# Patient Record
Sex: Female | Born: 1980 | Race: White | Hispanic: No | Marital: Married | Smoking: Current every day smoker
Health system: Southern US, Community
[De-identification: ages and names within clinical notes are randomized; demographics above are authoritative.]

## PROBLEM LIST (undated history)

## (undated) DIAGNOSIS — R928 Other abnormal and inconclusive findings on diagnostic imaging of breast: Secondary | ICD-10-CM

## (undated) DIAGNOSIS — N200 Calculus of kidney: Secondary | ICD-10-CM

## (undated) DIAGNOSIS — F419 Anxiety disorder, unspecified: Secondary | ICD-10-CM

## (undated) DIAGNOSIS — M546 Pain in thoracic spine: Secondary | ICD-10-CM

## (undated) DIAGNOSIS — Z853 Personal history of malignant neoplasm of breast: Secondary | ICD-10-CM

## (undated) DIAGNOSIS — I2699 Other pulmonary embolism without acute cor pulmonale: Secondary | ICD-10-CM

## (undated) DIAGNOSIS — K7290 Hepatic failure, unspecified without coma: Secondary | ICD-10-CM

## (undated) DIAGNOSIS — J449 Chronic obstructive pulmonary disease, unspecified: Secondary | ICD-10-CM

## (undated) DIAGNOSIS — C801 Malignant (primary) neoplasm, unspecified: Secondary | ICD-10-CM

## (undated) DIAGNOSIS — M6282 Rhabdomyolysis: Secondary | ICD-10-CM

## (undated) DIAGNOSIS — K219 Gastro-esophageal reflux disease without esophagitis: Secondary | ICD-10-CM

## (undated) DIAGNOSIS — M1990 Unspecified osteoarthritis, unspecified site: Secondary | ICD-10-CM

## (undated) DIAGNOSIS — J189 Pneumonia, unspecified organism: Secondary | ICD-10-CM

## (undated) DIAGNOSIS — I509 Heart failure, unspecified: Secondary | ICD-10-CM

## (undated) DIAGNOSIS — M797 Fibromyalgia: Secondary | ICD-10-CM

## (undated) DIAGNOSIS — N289 Disorder of kidney and ureter, unspecified: Secondary | ICD-10-CM

## (undated) DIAGNOSIS — E876 Hypokalemia: Secondary | ICD-10-CM

## (undated) DIAGNOSIS — I10 Essential (primary) hypertension: Secondary | ICD-10-CM

## (undated) HISTORY — PX: KIDNEY STONE SURGERY: SHX686

## (undated) HISTORY — PX: TOTAL HIP ARTHROPLASTY: SHX124

## (undated) HISTORY — PX: APPENDECTOMY: SHX54

## (undated) HISTORY — PX: ABDOMINAL HYSTERECTOMY: SHX81

## (undated) HISTORY — PX: CHOLECYSTECTOMY: SHX55

## (undated) HISTORY — PX: OTHER SURGICAL HISTORY: SHX169

## (undated) HISTORY — DX: Unspecified osteoarthritis, unspecified site: M19.90

---

## 2010-08-30 ENCOUNTER — Inpatient Hospital Stay
Admit: 2010-08-30 | Discharge status: Against Medical Advice | Attending: Internal Medicine | Admitting: Internal Medicine

## 2012-03-10 ENCOUNTER — Emergency Department (HOSPITAL_COMMUNITY): Payer: Medicaid Other

## 2012-03-10 ENCOUNTER — Encounter (HOSPITAL_COMMUNITY): Payer: Self-pay | Admitting: *Deleted

## 2012-03-10 ENCOUNTER — Emergency Department (HOSPITAL_COMMUNITY)
Admission: EM | Admit: 2012-03-10 | Discharge: 2012-03-10 | Disposition: A | Discharge status: Home / Self Care | Payer: Medicaid Other | Attending: Emergency Medicine | Admitting: Emergency Medicine

## 2012-03-10 DIAGNOSIS — J4489 Other specified chronic obstructive pulmonary disease: Secondary | ICD-10-CM | POA: Insufficient documentation

## 2012-03-10 DIAGNOSIS — R079 Chest pain, unspecified: Secondary | ICD-10-CM | POA: Insufficient documentation

## 2012-03-10 DIAGNOSIS — K449 Diaphragmatic hernia without obstruction or gangrene: Secondary | ICD-10-CM | POA: Insufficient documentation

## 2012-03-10 DIAGNOSIS — J40 Bronchitis, not specified as acute or chronic: Secondary | ICD-10-CM | POA: Insufficient documentation

## 2012-03-10 DIAGNOSIS — F411 Generalized anxiety disorder: Secondary | ICD-10-CM | POA: Insufficient documentation

## 2012-03-10 DIAGNOSIS — Z86718 Personal history of other venous thrombosis and embolism: Secondary | ICD-10-CM | POA: Insufficient documentation

## 2012-03-10 DIAGNOSIS — J449 Chronic obstructive pulmonary disease, unspecified: Secondary | ICD-10-CM | POA: Insufficient documentation

## 2012-03-10 DIAGNOSIS — R0602 Shortness of breath: Secondary | ICD-10-CM | POA: Insufficient documentation

## 2012-03-10 DIAGNOSIS — R10816 Epigastric abdominal tenderness: Secondary | ICD-10-CM | POA: Insufficient documentation

## 2012-03-10 DIAGNOSIS — R112 Nausea with vomiting, unspecified: Secondary | ICD-10-CM | POA: Insufficient documentation

## 2012-03-10 DIAGNOSIS — E119 Type 2 diabetes mellitus without complications: Secondary | ICD-10-CM | POA: Insufficient documentation

## 2012-03-10 DIAGNOSIS — Z9089 Acquired absence of other organs: Secondary | ICD-10-CM | POA: Insufficient documentation

## 2012-03-10 DIAGNOSIS — I10 Essential (primary) hypertension: Secondary | ICD-10-CM | POA: Insufficient documentation

## 2012-03-10 HISTORY — DX: Gastro-esophageal reflux disease without esophagitis: K21.9

## 2012-03-10 HISTORY — DX: Other pulmonary embolism without acute cor pulmonale: I26.99

## 2012-03-10 HISTORY — DX: Hypokalemia: E87.6

## 2012-03-10 HISTORY — DX: Essential (primary) hypertension: I10

## 2012-03-10 HISTORY — DX: Disorder of kidney and ureter, unspecified: N28.9

## 2012-03-10 HISTORY — DX: Chronic obstructive pulmonary disease, unspecified: J44.9

## 2012-03-10 HISTORY — DX: Pneumonia, unspecified organism: J18.9

## 2012-03-10 HISTORY — DX: Heart failure, unspecified: I50.9

## 2012-03-10 LAB — URINALYSIS, ROUTINE W REFLEX MICROSCOPIC
Glucose, UA: NEGATIVE mg/dL
Leukocytes, UA: NEGATIVE
Nitrite: NEGATIVE
Specific Gravity, Urine: 1.031 — ABNORMAL HIGH (ref 1.005–1.030)
pH: 6.5 (ref 5.0–8.0)

## 2012-03-10 LAB — CBC
HCT: 36.9 % (ref 36.0–46.0)
MCHC: 32 g/dL (ref 30.0–36.0)
Platelets: 180 10*3/uL (ref 150–400)
RDW: 14.8 % (ref 11.5–15.5)
WBC: 6.7 10*3/uL (ref 4.0–10.5)

## 2012-03-10 LAB — RAPID URINE DRUG SCREEN, HOSP PERFORMED
Amphetamines: POSITIVE — AB
Barbiturates: NOT DETECTED
Cocaine: NOT DETECTED
Opiates: POSITIVE — AB
Tetrahydrocannabinol: NOT DETECTED

## 2012-03-10 LAB — COMPREHENSIVE METABOLIC PANEL
AST: 23 U/L (ref 0–37)
Albumin: 3.8 g/dL (ref 3.5–5.2)
BUN: 12 mg/dL (ref 6–23)
Chloride: 104 mEq/L (ref 96–112)
Creatinine, Ser: 0.72 mg/dL (ref 0.50–1.10)
Potassium: 3.5 mEq/L (ref 3.5–5.1)
Total Protein: 7.1 g/dL (ref 6.0–8.3)

## 2012-03-10 LAB — LIPASE, BLOOD: Lipase: 26 U/L (ref 11–59)

## 2012-03-10 LAB — PREGNANCY, URINE: Preg Test, Ur: NEGATIVE

## 2012-03-10 MED ORDER — SODIUM CHLORIDE 0.9 % IV BOLUS (SEPSIS)
1000.0000 mL | Freq: Once | INTRAVENOUS | Status: AC
  Administered 2012-03-10: 1000 mL via INTRAVENOUS

## 2012-03-10 MED ORDER — LORAZEPAM 2 MG/ML IJ SOLN
1.0000 mg | Freq: Once | INTRAMUSCULAR | Status: AC
  Administered 2012-03-10: 1 mg via INTRAVENOUS
  Filled 2012-03-10: qty 1

## 2012-03-10 MED ORDER — PANTOPRAZOLE SODIUM 40 MG IV SOLR
40.0000 mg | Freq: Once | INTRAVENOUS | Status: AC
  Administered 2012-03-10: 40 mg via INTRAVENOUS
  Filled 2012-03-10: qty 40

## 2012-03-10 MED ORDER — SODIUM CHLORIDE 0.9 % IV BOLUS (SEPSIS)
500.0000 mL | Freq: Once | INTRAVENOUS | Status: AC
  Administered 2012-03-10: 500 mL via INTRAVENOUS

## 2012-03-10 MED ORDER — ALBUTEROL SULFATE (5 MG/ML) 0.5% IN NEBU
5.0000 mg | INHALATION_SOLUTION | RESPIRATORY_TRACT | Status: AC
  Administered 2012-03-10: 5 mg via RESPIRATORY_TRACT
  Filled 2012-03-10: qty 1

## 2012-03-10 MED ORDER — ALBUTEROL SULFATE HFA 108 (90 BASE) MCG/ACT IN AERS: 2.0000 | INHALATION_SPRAY | RESPIRATORY_TRACT | Status: DC | PRN

## 2012-03-10 MED ORDER — HYDROCOD POLST-CHLORPHEN POLST 10-8 MG/5ML PO LQCR: 5.0000 mL | Freq: Two times a day (BID) | ORAL | Status: DC | PRN

## 2012-03-10 MED ORDER — ONDANSETRON HCL 4 MG/2ML IJ SOLN
4.0000 mg | Freq: Once | INTRAMUSCULAR | Status: AC
  Administered 2012-03-10: 4 mg via INTRAVENOUS
  Filled 2012-03-10: qty 2

## 2012-03-10 MED ORDER — MORPHINE SULFATE 4 MG/ML IJ SOLN
4.0000 mg | Freq: Once | INTRAMUSCULAR | Status: AC
  Administered 2012-03-10: 4 mg via INTRAVENOUS
  Filled 2012-03-10: qty 1

## 2012-03-10 MED ORDER — IOHEXOL 350 MG/ML SOLN
100.0000 mL | Freq: Once | INTRAVENOUS | Status: AC | PRN
  Administered 2012-03-10: 100 mL via INTRAVENOUS

## 2012-03-10 MED ORDER — ONDANSETRON 4 MG PO TBDP: 4.0000 mg | ORAL_TABLET | Freq: Three times a day (TID) | ORAL | Status: AC | PRN
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
## 2012-03-10 NOTE — Progress Notes (Signed)
Met with patient who reiterated life story of herself and her children, similar to that told to nurse. We discussed appointing a physician so that she can use her Medicaid. Mart Piggs PA aware of need for referral. Consulted with SW Lennox Laity to assist with any social issues concerning the patient.

## 2012-03-10 NOTE — ED Notes (Signed)
POC per EDP is to send pt home if all findings are within normal, Therapist, nutritional for information [facility] and [facility]

## 2012-03-10 NOTE — ED Notes (Signed)
Pt reports nausea and vomiting since last night. Reports today while getting out the shower, states she was found by someone after approx . 4mg  of zofran given en route. 324ASA and 2nitros reports chest pain 8/10 to 5/10 after nitros. Pt complaining of chest pain and sob that started today after syncopal episode.

## 2012-03-10 NOTE — ED Provider Notes (Signed)
3:45 PM Patient is in CDU holding for CT angio chest.  Sign out received from  [facility], PA-C, at change of shift.   Patient with a complicated medical and social history, reports hx multiple PEs, "ran out" of coumadin 2 months ago.  Presents today with c/o chest pain worse with deep inspiration, productive cough, N/V.  On exam, pt is A&Ox4, NAD, RRR, CTAB, abdomen is obese, soft, ttp epigastrium, no guarding, no rebound.  Plan is for CT angio chest.  If negative, pt to be d/c home with symptomatic medications.  I have spoken with Konrad Felix, case manager, who has also involved Jody of social work to speak with patient about PCP follow up/ resources in the area.    4:52 PM I have discussed all results with patient.  She is aware of the subpleural nodule and also of her anemia.  Pt reports she is still short of breath and having pain with breathing.  I have ordered morphine and a breathing treatment for her prior to departure.  Pt to be d/c home with symptomatic medications for bronchitis and N/V.  Resources to be given for follow up.  Patient verbalizes understanding and agrees with plan.     Results for orders placed during the hospital encounter of 03/10/12  CBC      Component Value Range   WBC 6.7  4.0 - 10.5 K/uL   RBC 4.80  3.87 - 5.11 MIL/uL   Hemoglobin 11.8 (*) 12.0 - 15.0 g/dL   HCT 16.1  09.6 - 04.5 %   MCV 76.9 (*) 78.0 - 100.0 fL   MCH 24.6 (*) 26.0 - 34.0 pg   MCHC 32.0  30.0 - 36.0 g/dL   RDW 40.9  81.1 - 91.4 %   Platelets 180  150 - 400 K/uL  COMPREHENSIVE METABOLIC PANEL      Component Value Range   Sodium 139  135 - 145 mEq/L   Potassium 3.5  3.5 - 5.1 mEq/L   Chloride 104  96 - 112 mEq/L   CO2 22  19 - 32 mEq/L   Glucose, Bld 99  70 - 99 mg/dL   BUN 12  6 - 23 mg/dL   Creatinine, Ser 7.82  0.50 - 1.10 mg/dL   Calcium 9.6  8.4 - 95.6 mg/dL   Total Protein 7.1  6.0 - 8.3 g/dL   Albumin 3.8  3.5 - 5.2 g/dL   AST 23  0 - 37 U/L   ALT 22  0 - 35 U/L   Alkaline  Phosphatase 125 (*) 39 - 117 U/L   Total Bilirubin 0.1 (*) 0.3 - 1.2 mg/dL   GFR calc non Af Amer >90  >90 mL/min   GFR calc Af Amer >90  >90 mL/min  LIPASE, BLOOD      Component Value Range   Lipase 26  11 - 59 U/L  URINALYSIS, ROUTINE W REFLEX MICROSCOPIC      Component Value Range   Color, Urine YELLOW  YELLOW   APPearance HAZY (*) CLEAR   Specific Gravity, Urine 1.031 (*) 1.005 - 1.030   pH 6.5  5.0 - 8.0   Glucose, UA NEGATIVE  NEGATIVE mg/dL   Hgb urine dipstick NEGATIVE  NEGATIVE   Bilirubin Urine NEGATIVE  NEGATIVE   Ketones, ur NEGATIVE  NEGATIVE mg/dL   Protein, ur NEGATIVE  NEGATIVE mg/dL   Urobilinogen, UA 0.2  0.0 - 1.0 mg/dL   Nitrite NEGATIVE  NEGATIVE   Leukocytes, UA  NEGATIVE  NEGATIVE  PREGNANCY, URINE      Component Value Range   Preg Test, Ur NEGATIVE  NEGATIVE  TROPONIN I      Component Value Range   Troponin I <0.30  <0.30 ng/mL  URINE RAPID DRUG SCREEN (HOSP PERFORMED)      Component Value Range   Opiates POSITIVE (*) NONE DETECTED   Cocaine NONE DETECTED  NONE DETECTED   Benzodiazepines NONE DETECTED  NONE DETECTED   Amphetamines POSITIVE (*) NONE DETECTED   Tetrahydrocannabinol NONE DETECTED  NONE DETECTED   Barbiturates NONE DETECTED  NONE DETECTED  PROTIME-INR      Component Value Range   Prothrombin Time 14.2  11.6 - 15.2 seconds   INR 1.08  0.00 - 1.49   Dg Chest 2 View  03/10/2012  *RADIOLOGY REPORT*  Clinical Data: Worsening left side chest pain, history diabetes, hypertension, COPD, CHF  CHEST - 2 VIEW  Comparison: None  Findings: Upper-normal size of cardiac silhouette. Mediastinal contours and pulmonary vascularity normal. Minimal peribronchial thickening. No definite infiltrate, pleural effusion or pneumothorax. Bones unremarkable.  IMPRESSION: Minimal bronchitic changes.  Original Report Authenticated By: Lollie Marrow, M.D.   Ct Angio Chest W/cm &/or Wo Cm  03/10/2012  *RADIOLOGY REPORT*  Clinical Data: Chest pain and shortness of  breath that began today after a syncopal episode.  CT ANGIOGRAPHY CHEST  Technique:  Multidetector CT imaging of the chest using the standard protocol during bolus administration of intravenous contrast. Multiplanar reconstructed images including MIPs were obtained and reviewed to evaluate the vascular anatomy.  Contrast: OMNIPAQUE IOHEXOL 350 MG/ML SOLN  Comparison: No priors.  Findings:  Mediastinum: No filling defects within the pulmonary arterial tree to suggest underlying pulmonary embolism. Heart size is normal. There is no significant pericardial fluid, thickening or pericardial calcification. No pathologically enlarged mediastinal or hilar lymph nodes. There is a small - moderate hiatal hernia.  Lungs/Pleura: No acute consolidative airspace disease.  No pleural effusions.  There is a 4 mm subpleural nodule associated with the left major fissure (image 35 of series six), nonspecific in appearance but strongly favored to represent a subpleural lymph node in this young patient.  Additionally, there is a 4 mm ground- glass attenuation nodule in the right upper lobe (image 32 of series 6) which is nonspecific but statistically benign in this young patient (likely inflammatory in etiology).  Upper Abdomen: Status post cholecystectomy.  There is a small amount of focal fat adjacent the falciform ligament within the liver incidentally noted.  Otherwise, unremarkable.  Musculoskeletal: There are no aggressive appearing lytic or blastic lesions noted in the visualized portions of the skeleton.  IMPRESSION: 1. Negative for pulmonary embolism. 2.  There is a 4 mm ground-glass attenuation nodule within the right upper lobe which is statistically likely to be benign in this young patient, likely inflammatory in etiology. No imaging follow- up is required for this lesion.  No other potentially acute findings to account for the patient's symptoms. 3.  Small - moderate hiatal hernia. 4.  Status post cholecystectomy.   Original Report Authenticated By: Florencia Reasons, M.D.      Dillard [place], [place] 03/10/12 [phone]

## 2012-03-10 NOTE — Discharge Instructions (Signed)
Read the information below.  Use the resources provided to find a primary care provider for follow up.  If you develop fevers, uncontrolled pain, difficulty swallowing or breathing, or are unable to tolerate fluids by mouth, please return to the ER immediately for a recheck.  Please discuss all medications with your pharmacist.  You may return to the ER at any time for worsening condition or any new symptoms that concern you.  Bronchitis Bronchitis is a problem of the air tubes leading to your lungs. This problem makes it hard for air to get in and out of the lungs. You may cough a lot because your air tubes are narrow. Going without care can cause lasting (chronic) bronchitis. HOME CARE   Drink enough fluids to keep your pee (urine) clear or pale yellow.   Use a cool mist humidifier.   Quit smoking if you smoke. If you keep smoking, the bronchitis might not get better.   Only take medicine as told by your doctor.  GET HELP RIGHT AWAY IF:   Coughing keeps you awake.   You start to wheeze.   You become more sick or weak.   You have a hard time breathing or get short of breath.   You cough up blood.   Coughing lasts more than 2 weeks.   You have a fever.   Your baby is older than 3 months with a rectal temperature of 102 F (38.9 C) or higher.   Your baby is 97 months old or younger with a rectal temperature of 100.4 F (38 C) or higher.  MAKE SURE YOU:  Understand these instructions.   Will watch your condition.   Will get help right away if you are not doing well or get worse.  Document Released: 03/01/2008 Document Revised: 09/02/2011 Document Reviewed: 08/15/2009 [facility] Patient Information 2012 [place], [place].  Hiatal Hernia A hiatal hernia occurs when a part of the stomach slides above the diaphragm. The diaphragm is the thin muscle separating the belly (abdomen) from the chest. A hiatal hernia can be something you are born with or develop over time. Hiatal hernias  may allow stomach acid to flow back into your esophagus, the tube which carries food from your mouth to your stomach. If this acid causes problems it is called GERD (gastro-esophageal reflux disease).  SYMPTOMS  Common symptoms of GERD are heartburn (burning in your chest). This is worse when lying down or bending over. It may also cause belching and indigestion. Some of the things which make GERD worse are:  Increased weight pushes on stomach making acid rise more easily.   Smoking markedly increases acid production.   Alcohol decreases lower esophageal sphincter pressure (valve between stomach and esophagus), allowing acid from stomach into esophagus.   Late evening meals and going to bed with a full stomach increases pressure.   Anything that causes an increase in acid production.   Lower esophageal sphincter incompetence.  DIAGNOSIS  Hiatal hernia is often diagnosed with x-rays of your stomach and small bowel. This is called an UGI (upper gastrointestinal x-ray). Sometimes a gastroscopic procedure is done. This is a procedure where your caregiver uses a flexible instrument to look into the stomach and small bowel. HOME CARE INSTRUCTIONS   Try to achieve and maintain an ideal body weight.   Avoid drinking alcoholic beverages.   Stop smoking.   Put the head of your bed on 4 to 6 inch blocks. This will keep your head and esophagus higher than  your stomach. If you cannot use blocks, sleep with several pillows under your head and shoulders.   Over-the-counter medications will decrease acid production. Your caregiver can also prescribe medications for this. Take as directed.   1/2 to 1 teaspoon of an antacid taken every hour while awake, with meals and at bedtime, will neutralize acid.   Do not take aspirin, ibuprofen (Advil or Motrin), or other nonsteroidal anti-inflammatory drugs.   Do not wear tight clothing around your chest or stomach.   Eat smaller meals and eat more  frequently. This keeps your stomach from getting too full. Eat slowly.   Do not lie down for 2 or 3 hours after eating. Do not eat or drink anything 1 to 2 hours before going to bed.   Avoid caffeine beverages (colas, coffee, cocoa, tea), fatty foods, citrus fruits and all other foods and drinks that contain acid and that seem to increase the problems.   Avoid bending over, especially after eating. Also avoid straining during bowel movements or when urinating or lifting things. Anything that increases the pressure in your belly increases the amount of acid that may be pushed up into your esophagus.  SEEK IMMEDIATE MEDICAL CARE IF:  There is change in location (pain in arms, neck, jaw, teeth or back) of your pain, or the pain is getting worse.   You also experience nausea, vomiting, sweating (diaphoresis), or shortness of breath.   You develop continual vomiting, vomit blood or coffee ground material, have bright red blood in your stools, or have black tarry stools.  Some of these symptoms could signal other problems such as heart disease. MAKE SURE YOU:   Understand these instructions.   Monitor your condition.   Contact your caregiver if you are not doing well or are getting worse.  Document Released: 12/04/2003 Document Revised: 09/02/2011 Document Reviewed: 09/13/2005 [facility] Patient Information 2012 [place], [place].  If you have no primary doctor, here are some resources that may be helpful:  Medicaid-accepting [facility] Providers:   - [facility]- [address]      [phone]      Mon-Fri 9am-7pm, Sat 9am-1pm   - [facility]- [address] [place], [place]      [phone]   - [facility]- [address] [place]      [phone]   [facility] Family [facility]- [address]      [phone]   - Renaye Rakers- [address] [place], [address]      [phone]      Only accepts [place]  patients       after they have her name applied to their card   Self Pay (no insurance) in [place]:   - Sickle Cell Patients: Dr Willey Blade, [facility] Internal Medicine      [address] [place]      [phone]   - Health [phone]   - Physician Referral Service- [phone]   - [facility]- [address] [place]      [phone]   [facility] [place]- [address]   - [facility]- see information above      (Speak to [employer] if you do not have insurance)   - [facility]- [address] [place]      [phone]   - [facility] [place]-  [address]      [phone]   - [facility]- [address]      [phone]   - Dr Julio Sicks-  [address], [place]      [phone]   - [facility]- [address]      [phone]   - [facility]- [address]      [phone]      Also [address]      [phone]   - [facility]- [address]      [phone]      1st and 3rd Saturday every month, 10am-1pm Other agencies that provide inexpensive medical care:    [facility]  [phone]    [facility]  [phone]    [facility]  [phone]    [facility]  [phone]    [facility]  [phone]  General Information: Finding a doctor when you do not have health insurance can be tricky. Although you are not limited by an insurance plan, you are of course limited by her finances and how much but he can pay out of pocket.  What are your options if you don't have health insurance?   1) Find a Librarian, academic and Pay Out of Pocket Although you won't have to find out who is covered by your insurance plan, it is a good idea to ask around and get recommendations. You will then need to call the office and see if the doctor you have chosen will accept you as a new patient and what types of options they offer for patients who are  self-pay. Some doctors offer discounts or will set up payment plans for their patients who do not have insurance, but you will need to ask so you aren't surprised when you get to your appointment.  2) Contact Your Local Health Department Not all health departments have doctors that can see patients for sick visits, but many do, so it is worth a call to see if yours does. If you don't know where your local health department is, you can check in your phone book. The CDC also has a tool to help you locate your state's health department, and many state websites also have listings of all of their local health departments.  3) Find a Walk-in Clinic If your illness is not likely to be very severe or complicated, you may want to try a walk in clinic. These are popping up all over the country in pharmacies, drugstores, and shopping centers. They're usually staffed by nurse practitioners or physician assistants that have been trained to treat common illnesses and complaints. They're usually fairly quick and inexpensive. However, if you have serious medical issues or chronic medical problems, these are probably not your best option   RESOURCE GUIDE  Chronic Pain Problems: Contact [facility]  [phone] Patients need to be referred by their primary care doctor.  Insufficient Money for Medicine: Contact United Way:  call "211" or [facility] [phone].  No Primary Care Doctor: - Call Health Connect  [phone] - can help you locate a primary care doctor that  accepts your insurance, provides certain services, etc. - Physician Referral Service- [phone]  Agencies that provide inexpensive medical care: - [facility]  [phone] - [facility]  [phone] - [facility]  [phone] - Women's Clinic  [phone] -  [facility]  [phone] [facility]  [phone]  Medicaid-accepting [facility] Providers: - [facility]- [address]  [phone], Mon-Fri 9am-7pm, Sat 9am-1pm - [facility]- [address] [place], [place]  [phone] - [facility]- [address], [address] [place]  [phone] [facility] [place] [facility]- [address]  [phone] - Renaye Rakers- [address] [place], [address], [phone]  Only accepts [place] patients after they have their name  applied to their card  Self Pay (no insurance) in [place]: - Sickle Cell Patients: Dr Willey Blade, [facility] Medicine  [address] [place], [phone] - [facility] Urgent Care- [address] [place]  [phone]       [facility] [place]- [address]       -     [facility]- see information above (Speak to [employer] if you do not have insurance)       -  [facility]- [address] [place], [phone]       -  [facility]- [address],  [phone]       -  [facility]- [address], [phone]       -  Dr Julio Sicks-  [address], [place], [phone]       -  [facility]- [address] [phone]       -  [facility] [address] [phone], also [address], [phone]       -    [facility]- [address] [place], [phone], 1st & 3rd Saturday   every month, 10am-1pm  1) Find a Doctor and Pay Out of Pocket Although you won't have to find out who is covered by your insurance plan, it is a good idea to ask around and get recommendations. You will then need to call the office and see if the doctor you have chosen will accept you as a new patient and what types of options they offer for patients who are self-pay. Some doctors offer discounts or will set up payment plans for their patients who do not have insurance, but you will need to ask so you aren't surprised when you get to your appointment.  2) Contact Your Local  Health Department Not all health departments have doctors that can see patients for sick visits, but many do, so it is worth a call to see if yours does. If you don't know where your local health department is, you can check in your phone book. The CDC also has a tool to help you locate your state's health department, and many state websites also have listings of all of their local health departments.  3) Find a Walk-in Clinic If your illness is not likely to be very severe or complicated, you may want to try a walk in clinic. These are popping up all over the country in pharmacies, drugstores, and shopping centers. They're usually staffed by nurse practitioners or physician assistants that have been trained to treat common illnesses and complaints. They're usually fairly quick and inexpensive. However, if you have serious medical issues or chronic medical problems, these are probably not your best option  STD Testing - [facility] Department of [facility], STD Clinic, [address],  [place], phone [phone] or [phone].  Monday - Friday, call for an appointment. [facility] Department of [employer], STD Clinic, [address], [place], phone [phone] or [phone].  Monday - Friday, call for an appointment.  Abuse/Neglect: [facility] Child Abuse Hotline [phone] [facility] Child Abuse Hotline 715-389-5620 (After Hours)  Emergency Shelter:  [facility] [phone]  Maternity Homes: - Room at the [place] [facility] [phone] - [facility] [phone]  MRSA Hotline #:   240-179-1637  [facility] Resources  [facility] [place]  United Way [facility] Dept. [address]                 [address]         [address] [place] [address]  Blondell Reveal Phone:   [phone]                                  Phone:  [phone]                   Phone:  [phone]  [facility] Mental Health, [phone] - [phone]       -     [facility] in [place], [address]                                  [phone], [facility] Child Abuse Hotline 605-222-7848 or [phone] (After Hours)   Behavioral Health Services  Substance Abuse Resources: - Alcohol and Drug Services  409-185-2308 - [facility] [phone])184-4349 - [facility] [phone] Floydene Flock [phone] - Residential & Outpatient Substance Abuse Program  (514) [phone]  Psychological Services: [facility]  [phone] Services  [phone] - [facility], [phone] [place]. [address] [place], ACCESS LINE: (860)[phone] or ([phone], [email or web address]  Dental Assistance  If unable to pay or uninsured, contact:  [facility] Serve or [facility]. to become qualified for the adult dental clinic.  Patients with Medicaid: [facility] [phone], [phone], [phone]  If unable to pay, or uninsured, contact [facility] [phone]) or [facility] [place] Department [phone] in [place], [phone] in [facility]) to become qualified for the adult dental clinic  Other Low-Cost Community Dental Services: - [facility]- [address] [place], [place], [place], [address], [phone], [address], 2nd and 4th Thursday of the month at 6:30am.  10 clients each day by appointment, can sometimes see walk-in patients if someone does not show for an appointment. [facility]- [address] Ether Griffins [place], [place], [address], [phone] - [facility]- [address], [place], [place], [address], [phone] [facility] [phone] -  [facility] Department- [phone] [facility] [phone]
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
## 2012-03-11 NOTE — ED Provider Notes (Signed)
Medical screening examination/treatment/procedure(s) were conducted as a shared visit with non-physician practitioner(s) and myself.  I personally evaluated the patient during the encounter See H and P  Suzi Roots, MD 03/11/12 [phone]

## 2012-03-24 ENCOUNTER — Emergency Department (HOSPITAL_COMMUNITY)
Admission: EM | Admit: 2012-03-24 | Discharge: 2012-03-24 | Disposition: A | Discharge status: Home / Self Care | Payer: Medicaid Other | Attending: Emergency Medicine | Admitting: Emergency Medicine

## 2012-03-24 ENCOUNTER — Encounter (HOSPITAL_COMMUNITY): Payer: Self-pay | Admitting: Emergency Medicine

## 2012-03-24 ENCOUNTER — Emergency Department (HOSPITAL_COMMUNITY): Payer: Medicaid Other

## 2012-03-24 DIAGNOSIS — J449 Chronic obstructive pulmonary disease, unspecified: Secondary | ICD-10-CM | POA: Insufficient documentation

## 2012-03-24 DIAGNOSIS — R109 Unspecified abdominal pain: Secondary | ICD-10-CM | POA: Insufficient documentation

## 2012-03-24 DIAGNOSIS — I509 Heart failure, unspecified: Secondary | ICD-10-CM | POA: Insufficient documentation

## 2012-03-24 DIAGNOSIS — R319 Hematuria, unspecified: Secondary | ICD-10-CM | POA: Insufficient documentation

## 2012-03-24 DIAGNOSIS — Z79899 Other long term (current) drug therapy: Secondary | ICD-10-CM | POA: Insufficient documentation

## 2012-03-24 DIAGNOSIS — K219 Gastro-esophageal reflux disease without esophagitis: Secondary | ICD-10-CM | POA: Insufficient documentation

## 2012-03-24 DIAGNOSIS — E119 Type 2 diabetes mellitus without complications: Secondary | ICD-10-CM | POA: Insufficient documentation

## 2012-03-24 DIAGNOSIS — J4489 Other specified chronic obstructive pulmonary disease: Secondary | ICD-10-CM | POA: Insufficient documentation

## 2012-03-24 LAB — URINALYSIS, ROUTINE W REFLEX MICROSCOPIC
Glucose, UA: NEGATIVE mg/dL
Ketones, ur: 15 mg/dL — AB
Protein, ur: 100 mg/dL — AB
pH: 6 (ref 5.0–8.0)

## 2012-03-24 LAB — BASIC METABOLIC PANEL
BUN: 15 mg/dL (ref 6–23)
CO2: 22 mEq/L (ref 19–32)
Chloride: 100 mEq/L (ref 96–112)
GFR calc Af Amer: >90 mL/min (ref >90)
Glucose, Bld: 110 mg/dL — ABNORMAL HIGH (ref 70–99)
Potassium: 3.2 mEq/L — ABNORMAL LOW (ref 3.5–5.1)

## 2012-03-24 LAB — CBC WITH DIFFERENTIAL/PLATELET
Basophils Relative: 0 % (ref 0–1)
Hemoglobin: 12.8 g/dL (ref 12.0–15.0)
Lymphocytes Relative: 35 % (ref 12–46)
Lymphs Abs: 2.9 10*3/uL (ref 0.7–4.0)
Monocytes Relative: 5 % (ref 3–12)
Neutro Abs: 4.9 10*3/uL (ref 1.7–7.7)
Neutrophils Relative %: 59 % (ref 43–77)
RBC: 5.11 MIL/uL (ref 3.87–5.11)
WBC: 8.3 10*3/uL (ref 4.0–10.5)

## 2012-03-24 LAB — URINE MICROSCOPIC-ADD ON

## 2012-03-24 MED ORDER — ONDANSETRON HCL 4 MG/2ML IJ SOLN: 4.0000 mg | Freq: Once | INTRAMUSCULAR | Status: DC

## 2012-03-24 MED ORDER — ONDANSETRON 4 MG PO TBDP
8.0000 mg | ORAL_TABLET | Freq: Once | ORAL | Status: AC
  Administered 2012-03-24: 8 mg via ORAL
  Filled 2012-03-24: qty 2

## 2012-03-24 MED ORDER — ONDANSETRON 8 MG PO TBDP: 8.0000 mg | ORAL_TABLET | Freq: Three times a day (TID) | ORAL | Status: AC | PRN

## 2012-03-24 MED ORDER — OXYCODONE-ACETAMINOPHEN 5-325 MG PO TABS
2.0000 | ORAL_TABLET | Freq: Once | ORAL | Status: AC
  Administered 2012-03-24: 2 via ORAL
  Filled 2012-03-24: qty 2

## 2012-03-24 MED ORDER — MORPHINE SULFATE 4 MG/ML IJ SOLN
4.0000 mg | Freq: Once | INTRAMUSCULAR | Status: AC
Start: 2012-03-24 — End: 2012-03-24
  Administered 2012-03-24: 4 mg via INTRAVENOUS
  Filled 2012-03-24: qty 1

## 2012-03-24 MED ORDER — ONDANSETRON HCL 4 MG/2ML IJ SOLN
4.0000 mg | Freq: Once | INTRAMUSCULAR | Status: AC
  Administered 2012-03-24: 4 mg via INTRAVENOUS
  Filled 2012-03-24: qty 2

## 2012-03-24 MED ORDER — SODIUM CHLORIDE 0.9 % IV BOLUS (SEPSIS)
1000.0000 mL | Freq: Once | INTRAVENOUS | Status: AC
  Administered 2012-03-24: 1000 mL via INTRAVENOUS

## 2012-03-24 NOTE — ED Notes (Signed)
Pt. Stated, i have kidney stones.  I'm hurting from my abd. Around to my back that started 6 days ago.

## 2012-03-24 NOTE — ED Notes (Signed)
Pt presented to the ER with complaint of bilateral flank pain more to the rt than left x 6 days with blood in the urine, fever and vomiting. Pt has a hx of kidney stone. Pt is A/A/Ox4, skin is warm and dry, respiration is even and unlabored.

## 2012-03-24 NOTE — Discharge Instructions (Signed)
Please read and follow all provided instructions.  Your diagnoses today include:  1. Flank pain   2. Hematuria     Tests performed today include:  Blood counts and electrolytes  Blood tests to check kidney function  CT of abdomen and pelvis - shows non-blocking stones in each kidney  Urine test to look for infection and pregnancy (in women) - no infection  Vital signs. See below for your results today.   Medications prescribed:   Zofran (ondansetron) - for nausea and vomiting  Take any prescribed medications only as directed.  Home care instructions:   Follow any educational materials contained in this packet.  Follow-up instructions: Please follow-up with your primary care provider in the next 2 days for further evaluation of your symptoms. If you do not have a primary care doctor -- see below for referral information.   Return instructions:  SEEK IMMEDIATE MEDICAL ATTENTION IF:  The pain does not go away or becomes severe   A temperature above 101F develops   Repeated vomiting occurs (multiple episodes)   The pain becomes localized to portions of the abdomen. The right side could possibly be appendicitis. In an adult, the left lower portion of the abdomen could be colitis or diverticulitis.   Blood is being passed in stools or vomit (bright red or black tarry stools)   You develop chest pain, difficulty breathing, dizziness or fainting, or become confused, poorly responsive, or inconsolable (young children)  If you have any other emergent concerns regarding your health  Additional Information: Abdominal (belly) pain can be caused by many things. Your caregiver performed an examination and possibly ordered blood/urine tests and imaging (CT scan, x-rays, ultrasound). Many cases can be observed and treated at home after initial evaluation in the emergency department. Even though you are being discharged home, abdominal pain can be unpredictable. Therefore, you need a  repeated exam if your pain does not resolve, returns, or worsens. Most patients with abdominal pain don't have to be admitted to the hospital or have surgery, but serious problems like appendicitis and gallbladder attacks can start out as nonspecific pain. Many abdominal conditions cannot be diagnosed in one visit, so follow-up evaluations are very important.  Your vital signs today were: BP 133/89  Pulse 104  Temp 98.2 F (36.8 C) (Oral)  Resp 20  SpO2 99% If your blood pressure (bp) was elevated above 135/85 this visit, please have this repeated by your doctor within one month. -------------- No Primary Care Doctor Call Health Connect  [phone] Other agencies that provide inexpensive medical care    [facility]  [phone]    [facility] Internal Medicine  [phone]    [facility]  [phone]    [facility]  [phone]    [facility]  [phone]    [facility]  [phone] -------------- RESOURCE GUIDE:  Dental Problems  Patients with Medicaid: [facility] Dental [phone] [address]                                            [phone] [address] [employer] Phone:  [phone]  Phone:  [phone]  If unable to pay or uninsured, contact:  [facility] Serve or [facility]. to become qualified for the adult dental clinic.  Chronic Pain Problems Contact [facility]  [phone] Patients need to be referred by their primary care doctor.  Insufficient Money for Medicine Contact United Way:  call "211" or [facility] [phone].  Psychological Services [facility]  [phone] [facility]  [phone] [facility] Mental Health   [phone] (emergency services [phone])  Substance Abuse Resources Alcohol and Drug Services  [phone] [facility] 716-016-4092 [facility] [place] [facility]  [phone] Floydene Flock 2390879338 Residential & Outpatient Substance Abuse Program  [phone]  Abuse/Neglect [facility] Child Abuse Hotline 225-172-2667 [facility] Child Abuse Hotline 913-311-5805 (After Hours)  Emergency Shelter [facility] [phone]  Maternity Homes Room at the Deputy of the [facility] [phone] [place] Services [phone]  [facility] Resources  [facility] [place]     United Way                          [facility] Dept. [address] [place]                       [address]      [address] [place] [address]  Blondell Reveal Phone:  [phone]                                   Phone:  [phone]                 Phone:  [phone]  [facility] Mental Health Phone:  [phone]  [facility] Child Abuse Hotline (770)186-6546 780-105-1633 (After Hours)

## 2012-03-24 NOTE — ED Provider Notes (Signed)
History     CSN: [id number]  Arrival date & time 03/24/12  7829   First MD Initiated Contact with Patient 03/24/12 [phone]      Chief Complaint  Patient presents with  . Abdominal Pain    (Consider location/radiation/quality/duration/timing/severity/associated sxs/prior treatment) HPI Comments: Patients with complex medical history presents with complaint of right lower back pain with radiation to right lower quadrant for the past 6 days. Patient states this is similar to previous kidney stones that she has had. Pain is described as sharp and burning. It is associated with dysuria and hematuria. Patient states that she had a fever yesterday to 102. Patient states that she has had vomiting for several days and is unable to keep down fluids or solids. Vomiting is nonbloody. No change in bowel movements. Patient takes chronic pain medication which have not been helping. Nothing makes symptoms better or worse. Onset was acute. Course is constant. In addition, patient states that she has had 8 episodes of syncope in the past 2 days. These are accompanied with a prodrome of lightheadedness. She denies injury when falling. These episodes have been unwitnessed. Patient's surgical history includes cholecystectomy, appendectomy, complete hysterectomy secondary to ovarian cancer, partial colectomy secondary to ovarian cancer.  Patient is a 31 y.o. female presenting with back pain. The history is provided by the patient.  Back Pain  This is a new problem. The current episode started more than 2 days ago. The problem occurs constantly. The problem has not changed since onset.The pain is associated with no known injury. The pain does not radiate. The pain is moderate. Associated symptoms include a fever (yesterday only to 102), headaches and dysuria. Pertinent negatives include no chest pain, no abdominal pain, no paresthesias and no weakness. She has tried analgesics for the symptoms. The treatment provided no  relief.    Past Medical History  Diagnosis Date  . Diabetes mellitus   . Pulmonary embolism   . CHF (congestive heart failure)   . Hypertension   . COPD (chronic obstructive pulmonary disease)   . Pneumonia   . GERD (gastroesophageal reflux disease)   . Hypokalemia   . Renal disorder     kidney stones    History reviewed. No pertinent past surgical history.  No family history on file.  History  Substance Use Topics  . Smoking status: Not on file  . Smokeless tobacco: Not on file  . Alcohol Use: No    OB History    Grav Para Term Preterm Abortions TAB SAB Ect Mult Living                  Review of Systems  Constitutional: Positive for fever (yesterday only to 102).  HENT: Negative for sore throat and rhinorrhea.   Eyes: Negative for redness.  Respiratory: Negative for cough.   Cardiovascular: Negative for chest pain.  Gastrointestinal: Positive for nausea and vomiting. Negative for abdominal pain and diarrhea.  Genitourinary: Positive for dysuria, frequency and hematuria. Negative for vaginal bleeding and vaginal discharge.  Musculoskeletal: Positive for back pain. Negative for myalgias.  Skin: Negative for rash.  Neurological: Positive for headaches. Negative for weakness and paresthesias.    Allergies  Penicillins; Zanaflex; and Levaquin  Home Medications   Current Outpatient Rx  Name Route Sig Dispense Refill  . ALBUTEROL SULFATE HFA 108 (90 BASE) MCG/ACT IN AERS Inhalation Inhale 2 puffs into the lungs every 6 (six) hours as needed. Shortness of breath    . ALISKIREN-HYDROCHLOROTHIAZIDE 150-12.5 MG  PO TABS Oral Take 1 tablet by mouth daily.     . ATORVASTATIN CALCIUM 40 MG PO TABS Oral Take 40 mg by mouth daily.    Marland Kitchen CLONAZEPAM 0.125 MG PO TBDP Oral Take 0.25 mg by mouth 2 (two) times daily.    Marland Kitchen CLONIDINE HCL 0.1 MG PO TABS Oral Take 0.1 mg by mouth 2 (two) times daily.    . CYCLOBENZAPRINE HCL 10 MG PO TABS Oral Take 10 mg by mouth 3 (three) times  daily.    Marland Kitchen DILTIAZEM HCL ER COATED BEADS 360 MG PO CP24 Oral Take 360 mg by mouth daily.    Marland Kitchen FERROUS GLUCONATE 324 (38 FE) MG PO TABS Oral Take 324 mg by mouth daily after supper.    Marland Kitchen FLUTICASONE PROPIONATE 50 MCG/ACT NA SUSP Nasal Place 2 sprays into the nose daily as needed. For congestion    . GABAPENTIN 800 MG PO TABS Oral Take 800 mg by mouth 3 (three) times daily.    . IBUPROFEN 800 MG PO TABS Oral Take 800 mg by mouth every 8 (eight) hours as needed. For pain    . LEVOTHYROXINE SODIUM 50 MCG PO TABS Oral Take 50 mcg by mouth every other day. Takes 50 mcg one day & 75 mcg the next day    . LEVOTHYROXINE SODIUM 75 MCG PO TABS Oral Take 75 mcg by mouth every other day. Takes 50 mcg one day & 75 mcg the next day    . LISINOPRIL 40 MG PO TABS Oral Take 40 mg by mouth daily.    Marland Kitchen METFORMIN HCL 500 MG PO TABS Oral Take 500 mg by mouth 2 (two) times daily with a meal.    . MORPHINE SULFATE ER 15 MG PO TBCR Oral Take 15 mg by mouth 2 (two) times daily.    . NEBIVOLOL HCL 10 MG PO TABS Oral Take 10 mg by mouth daily.    Marland Kitchen NITROGLYCERIN 0.4 MG SL SUBL Sublingual Place 0.4 mg under the tongue every 5 (five) minutes as needed. Chest pain    . OLMESARTAN MEDOXOMIL 20 MG PO TABS Oral Take 20 mg by mouth daily.    Marland Kitchen OMEPRAZOLE 20 MG PO CPDR Oral Take 20 mg by mouth 2 (two) times daily.     Marland Kitchen ONDANSETRON HCL 4 MG PO TABS Oral Take 4 mg by mouth every 8 (eight) hours as needed. nausea    . OXYCODONE-ACETAMINOPHEN 10-325 MG PO TABS Oral Take 1 tablet by mouth 4 (four) times daily.     Marland Kitchen PHENAZOPYRIDINE HCL 200 MG PO TABS Oral Take 200 mg by mouth 3 (three) times daily.    Marland Kitchen POTASSIUM CHLORIDE CRYS ER 20 MEQ PO TBCR Oral Take 40 mEq by mouth 2 (two) times daily.     Marland Kitchen PROMETHAZINE HCL 25 MG PO TABS Oral Take 25 mg by mouth every 8 (eight) hours as needed. For nausea    . RANITIDINE HCL 75 MG PO TABS Oral Take 75 mg by mouth 4 (four) times daily.    . SUMATRIPTAN SUCCINATE 100 MG PO TABS Oral Take 100 mg  by mouth every 2 (two) hours as needed. migraines    . TRAMADOL HCL ER 100 MG PO TB24 Oral Take 100 mg by mouth 2 (two) times daily.     . TRAZODONE HCL 100 MG PO TABS Oral Take 100 mg by mouth at bedtime.    Marland Kitchen EPINEPHRINE 0.3 MG/0.3ML IJ DEVI Intramuscular Inject 0.3 mg into the muscle  once. Allergic reaction      BP 129/90  Pulse 118  Temp 98.2 F (36.8 C) (Oral)  Resp 18  SpO2 99%  Physical Exam  Nursing note and vitals reviewed. Constitutional: She appears well-developed and well-nourished.  HENT:  Head: Normocephalic and atraumatic.  Right Ear: External ear normal.  Left Ear: External ear normal.  Nose: Nose normal.  Mouth/Throat: Uvula is midline and oropharynx is clear and moist. Mucous membranes are dry.  Eyes: Conjunctivae are normal. Right eye exhibits no discharge. Left eye exhibits no discharge.  Neck: Normal range of motion. Neck supple.  Cardiovascular: Regular rhythm and normal heart sounds.  Tachycardia present.   No murmur heard.      HR approx 100  Pulmonary/Chest: Effort normal and breath sounds normal. She has no wheezes. She has no rales.  Abdominal: Soft. There is no tenderness.  Musculoskeletal: She exhibits no tenderness.  Neurological: She is alert.  Skin: Skin is warm and dry.  Psychiatric: She has a normal mood and affect.    ED Course  Procedures (including critical care time)  Labs Reviewed  URINALYSIS, ROUTINE W REFLEX MICROSCOPIC - Abnormal; Notable for the following:    Color, Urine RED (*)  BIOCHEMICALS MAY BE AFFECTED BY COLOR   APPearance TURBID (*)     Specific Gravity, Urine 1.038 (*)     Hgb urine dipstick LARGE (*)     Bilirubin Urine SMALL (*)     Ketones, ur 15 (*)     Protein, ur 100 (*)     Leukocytes, UA SMALL (*)     All other components within normal limits  URINE MICROSCOPIC-ADD ON - Abnormal; Notable for the following:    Squamous Epithelial / LPF MANY (*)     Bacteria, UA FEW (*)     All other components within  normal limits  CBC WITH DIFFERENTIAL - Abnormal; Notable for the following:    MCV 77.7 (*)     MCH 25.0 (*)     All other components within normal limits  BASIC METABOLIC PANEL - Abnormal; Notable for the following:    Potassium 3.2 (*)     Glucose, Bld 110 (*)     All other components within normal limits   Ct Abdomen Pelvis Wo Contrast  03/24/2012  *RADIOLOGY REPORT*  Clinical Data: Bilateral flank pain.  Hematuria and fever  CT ABDOMEN AND PELVIS WITHOUT CONTRAST  Technique:  Multidetector CT imaging of the abdomen and pelvis was performed following the standard protocol without intravenous contrast.  Comparison: None.  Findings: Tiny 1 mm nonobstructing renal calculi bilaterally.  No ureteral calculi are identified.  Negative for hydronephrosis. Urinary bladder is normal.  No renal mass.  Lung bases are clear.  Liver and spleen are normal.  Gallbladder has been removed.  Pancreas is normal.  Negative for bowel obstruction or bowel thickening.  No mass or adenopathy.  IMPRESSION: Tiny nonobstructing renal calculi bilaterally.  No ureteral calculi are present.  Original Report Authenticated By: Camelia Phenes, M.D.     1. Flank pain   2. Hematuria     10:16 AM Patient seen and examined. Work-up initiated. Medications ordered.   Vital signs reviewed and are as follows: Filed Vitals:   03/24/12 0908  BP: 129/90  Pulse: 118  Temp: 98.2 F (36.8 C)  Resp: 18    Date: 03/24/2012  Rate: 82  Rhythm: normal sinus rhythm  QRS Axis: left  Intervals: normal  ST/T Wave abnormalities: normal  Conduction Disutrbances:none  Narrative Interpretation:   Old EKG Reviewed: none available  Patient history, exam, and lab/imaging findings discussed with Dr. Effie Shy.   Patient informed of all results. She was transitioned to oral pain and nausea medications without difficulty. Nausea medication rx for home. Patient has chronic pain needs and receives chronic pain medication from her pain  management doctor in [place] (confirmed on substance reporting database.   Discussed need to patient to establish PCP and urology follow-up in [place]. Appropriate referrals given.   The patient was urged to return to the Emergency Department immediately with worsening of current symptoms, worsening abdominal pain, persistent vomiting, blood noted in stools, fever, or any other concerns. The patient verbalized understanding.   BP 133/89  Pulse 104  Temp 98.2 F (36.8 C) (Oral)  Resp 20  SpO2 99%   MDM  Flank pain and hematuria: non-obstructing stones on CT. Mild hypokalemia. Normal creatinine. WBC count is normal. Abdomen is soft. No signs of UTI. Findings are reassuring that the patient does not have life threatening condition. She appears comfortable in ED. She is tolerating PO's. She is not severerly dehydrated -- but suspect mild dehydration treated with fluids. She appears well and non-toxic at discharge. She will benefit with establishing appropriate care in the area, especially given complex medical history.         Renne Crigler, [place] 03/24/12 1712

## 2012-03-28 NOTE — ED Provider Notes (Signed)
Medical screening examination/treatment/procedure(s) were performed by non-physician practitioner and as supervising physician I was immediately available for consultation/collaboration.  Flint Melter, MD 03/28/12 [phone]

## 2012-04-09 ENCOUNTER — Encounter (HOSPITAL_COMMUNITY): Payer: Self-pay

## 2012-04-09 ENCOUNTER — Emergency Department (HOSPITAL_COMMUNITY)
Admission: EM | Admit: 2012-04-09 | Discharge: 2012-04-09 | Disposition: A | Discharge status: Home / Self Care | Payer: Medicaid Other | Attending: Emergency Medicine | Admitting: Emergency Medicine

## 2012-04-09 DIAGNOSIS — J449 Chronic obstructive pulmonary disease, unspecified: Secondary | ICD-10-CM | POA: Insufficient documentation

## 2012-04-09 DIAGNOSIS — Z87442 Personal history of urinary calculi: Secondary | ICD-10-CM | POA: Insufficient documentation

## 2012-04-09 DIAGNOSIS — K219 Gastro-esophageal reflux disease without esophagitis: Secondary | ICD-10-CM | POA: Insufficient documentation

## 2012-04-09 DIAGNOSIS — N390 Urinary tract infection, site not specified: Secondary | ICD-10-CM

## 2012-04-09 DIAGNOSIS — J4489 Other specified chronic obstructive pulmonary disease: Secondary | ICD-10-CM | POA: Insufficient documentation

## 2012-04-09 DIAGNOSIS — I509 Heart failure, unspecified: Secondary | ICD-10-CM | POA: Insufficient documentation

## 2012-04-09 DIAGNOSIS — E119 Type 2 diabetes mellitus without complications: Secondary | ICD-10-CM | POA: Insufficient documentation

## 2012-04-09 DIAGNOSIS — IMO0001 Reserved for inherently not codable concepts without codable children: Secondary | ICD-10-CM | POA: Insufficient documentation

## 2012-04-09 HISTORY — DX: Fibromyalgia: M79.7

## 2012-04-09 HISTORY — DX: Heart failure, unspecified: I50.9

## 2012-04-09 LAB — URINALYSIS, ROUTINE W REFLEX MICROSCOPIC
Bilirubin Urine: NEGATIVE
Protein, ur: 100 mg/dL — AB
Urobilinogen, UA: 0.2 mg/dL (ref 0.0–1.0)

## 2012-04-09 LAB — URINE MICROSCOPIC-ADD ON

## 2012-04-09 MED ORDER — SULFAMETHOXAZOLE-TRIMETHOPRIM 800-160 MG PO TABS: 1.0000 | ORAL_TABLET | Freq: Two times a day (BID) | ORAL | Status: AC

## 2012-04-09 MED ORDER — SULFAMETHOXAZOLE-TMP DS 800-160 MG PO TABS
1.0000 | ORAL_TABLET | Freq: Once | ORAL | Status: AC
  Administered 2012-04-09: 1 via ORAL
  Filled 2012-04-09: qty 1

## 2012-04-09 MED ORDER — OXYCODONE-ACETAMINOPHEN 5-325 MG PO TABS: 1.0000 | ORAL_TABLET | ORAL | Status: AC | PRN

## 2012-04-09 MED ORDER — OXYCODONE-ACETAMINOPHEN 5-325 MG PO TABS
2.0000 | ORAL_TABLET | Freq: Once | ORAL | Status: AC
  Administered 2012-04-09: 2 via ORAL
  Filled 2012-04-09: qty 2

## 2012-04-09 NOTE — ED Notes (Signed)
Pt advocate at bedside

## 2012-04-09 NOTE — ED Notes (Signed)
Pt sts hx of kidney stones and that is what is wrong today, pt with blood clots and :peices of meat coming out of her"

## 2012-04-09 NOTE — ED Notes (Signed)
The patient is AOx4 and comfortable with her discharge instructions.  The patient's ride is her to drive her home.

## 2012-04-09 NOTE — ED Provider Notes (Signed)
History     CSN: [id number]  Arrival date & time 04/09/12  1400   First MD Initiated Contact with Patient 04/09/12 1732      Chief Complaint  Patient presents with  . Flank Pain    (Consider location/radiation/quality/duration/timing/severity/associated sxs/prior treatment) HPI Comments: Erica Andrews is a 31 y.o. Female who states that she has had left flank pain with hematuria, blood clots per urine, and passing "flesh" for several days. She was here 03/24/12; diagnosed with kidney stones, not in the ureter, treated with pain medication, and referred to urology. She states that she did not go to see urology because they would not accept her as a patient because she did not have a referral.  She states that she has had a fever, cough, and back pain. She has been taking her usual medications. She does not have a PCP in [place]. She recently moved here.  The history is provided by the patient.    Past Medical History  Diagnosis Date  . Diabetes mellitus   . Pulmonary embolism   . CHF (congestive heart failure)   . Hypertension   . COPD (chronic obstructive pulmonary disease)   . Pneumonia   . GERD (gastroesophageal reflux disease)   . Hypokalemia   . Renal disorder     kidney stones  . Congestive heart failure   . Fibromyalgia     No past surgical history on file.  No family history on file.  History  Substance Use Topics  . Smoking status: Not on file  . Smokeless tobacco: Not on file  . Alcohol Use: No    OB History    Grav Para Term Preterm Abortions TAB SAB Ect Mult Living                  Review of Systems  All other systems reviewed and are negative.    Allergies  Penicillins; Zanaflex; and Levaquin  Home Medications   Current Outpatient Rx  Name Route Sig Dispense Refill  . ALBUTEROL SULFATE HFA 108 (90 BASE) MCG/ACT IN AERS Inhalation Inhale 2 puffs into the lungs every 6 (six) hours as needed. Shortness of breath    .  ALISKIREN-HYDROCHLOROTHIAZIDE 150-12.5 MG PO TABS Oral Take 1 tablet by mouth daily.     . ATORVASTATIN CALCIUM 40 MG PO TABS Oral Take 40 mg by mouth daily.    Marland Kitchen CLONAZEPAM 0.125 MG PO TBDP Oral Take 0.25 mg by mouth 2 (two) times daily.    Marland Kitchen CLONIDINE HCL 0.1 MG PO TABS Oral Take 0.1 mg by mouth 2 (two) times daily.    . CYCLOBENZAPRINE HCL 10 MG PO TABS Oral Take 10 mg by mouth 3 (three) times daily.    Marland Kitchen DILTIAZEM HCL ER COATED BEADS 360 MG PO CP24 Oral Take 360 mg by mouth daily.    Marland Kitchen FERROUS GLUCONATE 324 (38 FE) MG PO TABS Oral Take 324 mg by mouth daily after supper.    Marland Kitchen FLUTICASONE PROPIONATE 50 MCG/ACT NA SUSP Nasal Place 2 sprays into the nose daily as needed. For congestion    . GABAPENTIN 800 MG PO TABS Oral Take 800 mg by mouth 3 (three) times daily.    . IBUPROFEN 800 MG PO TABS Oral Take 800 mg by mouth every 8 (eight) hours as needed. For pain    . LEVOTHYROXINE SODIUM 50 MCG PO TABS Oral Take 50 mcg by mouth every other day. Takes 50 mcg one day & 75 mcg the next  day    . LEVOTHYROXINE SODIUM 75 MCG PO TABS Oral Take 75 mcg by mouth every other day. Takes 50 mcg one day & 75 mcg the next day    . LISINOPRIL 40 MG PO TABS Oral Take 40 mg by mouth daily.    Marland Kitchen METFORMIN HCL 500 MG PO TABS Oral Take 500 mg by mouth 2 (two) times daily with a meal.    . MORPHINE SULFATE ER 15 MG PO TBCR Oral Take 15 mg by mouth 2 (two) times daily.    . NEBIVOLOL HCL 10 MG PO TABS Oral Take 10 mg by mouth daily.    Marland Kitchen OLMESARTAN MEDOXOMIL 20 MG PO TABS Oral Take 20 mg by mouth daily.    Marland Kitchen OMEPRAZOLE 20 MG PO CPDR Oral Take 20 mg by mouth 2 (two) times daily.     Marland Kitchen ONDANSETRON HCL 4 MG PO TABS Oral Take 4 mg by mouth every 8 (eight) hours as needed. nausea    . OXYCODONE-ACETAMINOPHEN 10-325 MG PO TABS Oral Take 1 tablet by mouth 4 (four) times daily.     Marland Kitchen POTASSIUM CHLORIDE CRYS ER 20 MEQ PO TBCR Oral Take 40 mEq by mouth 2 (two) times daily.     Marland Kitchen PROMETHAZINE HCL 25 MG PO TABS Oral Take 25 mg by  mouth every 8 (eight) hours as needed. For nausea    . RANITIDINE HCL 75 MG PO TABS Oral Take 75 mg by mouth 4 (four) times daily.    . SUMATRIPTAN SUCCINATE 100 MG PO TABS Oral Take 100 mg by mouth every 2 (two) hours as needed. migraines    . TRAMADOL HCL ER 100 MG PO TB24 Oral Take 100 mg by mouth 2 (two) times daily.     . TRAZODONE HCL 100 MG PO TABS Oral Take 100 mg by mouth at bedtime.    Marland Kitchen EPINEPHRINE 0.3 MG/0.3ML IJ DEVI Intramuscular Inject 0.3 mg into the muscle once. Allergic reaction    . NITROGLYCERIN 0.4 MG SL SUBL Sublingual Place 0.4 mg under the tongue every 5 (five) minutes as needed. Chest pain    . OXYCODONE-ACETAMINOPHEN 5-325 MG PO TABS Oral Take 1 tablet by mouth every 4 (four) hours as needed for pain. 10 tablet 0  . SULFAMETHOXAZOLE-TRIMETHOPRIM 800-160 MG PO TABS Oral Take 1 tablet by mouth 2 (two) times daily. 14 tablet 0    BP 139/93  Pulse 99  Temp 98.2 F (36.8 C) (Oral)  Resp 20  Ht 5\' 2"  (1.575 m)  Wt 200 lb (90.719 kg)  BMI 36.58 kg/m2  SpO2 100%  Physical Exam  Nursing note and vitals reviewed. Constitutional: She is oriented to person, place, and time. She appears well-developed and well-nourished.  HENT:  Head: Normocephalic and atraumatic.  Eyes: Conjunctivae and EOM are normal. Pupils are equal, round, and reactive to light.  Neck: Normal range of motion and phonation normal. Neck supple.  Cardiovascular: Normal rate, regular rhythm and intact distal pulses.   Pulmonary/Chest: Effort normal and breath sounds normal. She exhibits no tenderness.  Abdominal: Soft. She exhibits no distension. There is tenderness (Mild left upper). There is no guarding.  Genitourinary:       Left flank tender to light palpation  Musculoskeletal: Normal range of motion.  Neurological: She is alert and oriented to person, place, and time. She has normal strength. She exhibits normal muscle tone.  Skin: Skin is warm and dry.  Psychiatric: She has a normal mood and  affect. Her  behavior is normal. Judgment and thought content normal.    ED Course  Procedures (including critical care time)  ED Treatment: Percocet and Septra.  Labs Reviewed  URINALYSIS, ROUTINE W REFLEX MICROSCOPIC - Abnormal; Notable for the following:    Color, Urine RED (*)  BIOCHEMICALS MAY BE AFFECTED BY COLOR   APPearance TURBID (*)     Hgb urine dipstick LARGE (*)     Ketones, ur 15 (*)     Protein, ur 100 (*)     Nitrite POSITIVE (*)     Leukocytes, UA LARGE (*)     All other components within normal limits  URINE MICROSCOPIC-ADD ON - Abnormal; Notable for the following:    Squamous Epithelial / LPF FEW (*)     Bacteria, UA MANY (*)     All other components within normal limits  POCT PREGNANCY, URINE  URINE CULTURE   No results found.   1. UTI (lower urinary tract infection)       MDM  Evaluation consistent with UTI. Doubt ureteral stone, pyelonephritis, or metabolic instability. She has pain out of proportion to findings clinically, and on. Emergency department evaluation today, and recently. She is stable for discharge.   Plan: Home Medications- Septra and Breakthru pain med.; Home Treatments- rest, fluids; Recommended follow up- Rec. Find PCP asap         Flint Melter, MD 04/10/12 [phone]

## 2012-04-11 LAB — URINE CULTURE: Colony Count: >=100000

## 2012-04-12 NOTE — ED Notes (Signed)
+  urine Patient treated with Septra-sensitive to same-chart appended per protocol MD. 

## 2012-07-12 ENCOUNTER — Other Ambulatory Visit: Payer: Self-pay | Admitting: Diagnostic Neuroimaging

## 2012-07-12 DIAGNOSIS — R9409 Abnormal results of other function studies of central nervous system: Secondary | ICD-10-CM

## 2012-07-12 DIAGNOSIS — G629 Polyneuropathy, unspecified: Secondary | ICD-10-CM

## 2012-07-15 ENCOUNTER — Other Ambulatory Visit: Payer: Medicaid Other

## 2012-07-31 ENCOUNTER — Other Ambulatory Visit: Payer: Self-pay | Admitting: Diagnostic Neuroimaging

## 2012-07-31 ENCOUNTER — Ambulatory Visit
Admission: RE | Admit: 2012-07-31 | Discharge: 2012-07-31 | Disposition: A | Discharge status: Home / Self Care | Payer: Medicaid Other | Source: Ambulatory Visit | Attending: Diagnostic Neuroimaging | Admitting: Diagnostic Neuroimaging

## 2012-07-31 DIAGNOSIS — R9409 Abnormal results of other function studies of central nervous system: Secondary | ICD-10-CM

## 2012-07-31 DIAGNOSIS — Z77018 Contact with and (suspected) exposure to other hazardous metals: Secondary | ICD-10-CM

## 2012-07-31 DIAGNOSIS — G629 Polyneuropathy, unspecified: Secondary | ICD-10-CM

## 2012-07-31 MED ORDER — GADOBENATE DIMEGLUMINE 529 MG/ML IV SOLN
19.0000 mL | Freq: Once | INTRAVENOUS | Status: AC | PRN
  Administered 2012-07-31: 19 mL via INTRAVENOUS

## 2012-10-04 ENCOUNTER — Encounter (HOSPITAL_COMMUNITY): Payer: Self-pay | Admitting: Emergency Medicine

## 2012-10-04 ENCOUNTER — Inpatient Hospital Stay (HOSPITAL_COMMUNITY)
Admission: EM | Admit: 2012-10-04 | Discharge: 2012-10-07 | DRG: 195 | Disposition: A | Discharge status: Home / Self Care | Payer: Medicaid Other | Attending: Internal Medicine | Admitting: Internal Medicine

## 2012-10-04 DIAGNOSIS — Z86711 Personal history of pulmonary embolism: Secondary | ICD-10-CM

## 2012-10-04 DIAGNOSIS — Z887 Allergy status to serum and vaccine status: Secondary | ICD-10-CM

## 2012-10-04 DIAGNOSIS — F411 Generalized anxiety disorder: Secondary | ICD-10-CM | POA: Diagnosis present

## 2012-10-04 DIAGNOSIS — J449 Chronic obstructive pulmonary disease, unspecified: Secondary | ICD-10-CM | POA: Diagnosis present

## 2012-10-04 DIAGNOSIS — I509 Heart failure, unspecified: Secondary | ICD-10-CM | POA: Diagnosis present

## 2012-10-04 DIAGNOSIS — Z881 Allergy status to other antibiotic agents status: Secondary | ICD-10-CM

## 2012-10-04 DIAGNOSIS — J4489 Other specified chronic obstructive pulmonary disease: Secondary | ICD-10-CM | POA: Diagnosis present

## 2012-10-04 DIAGNOSIS — Z79899 Other long term (current) drug therapy: Secondary | ICD-10-CM

## 2012-10-04 DIAGNOSIS — E119 Type 2 diabetes mellitus without complications: Secondary | ICD-10-CM | POA: Diagnosis present

## 2012-10-04 DIAGNOSIS — N289 Disorder of kidney and ureter, unspecified: Secondary | ICD-10-CM | POA: Diagnosis present

## 2012-10-04 DIAGNOSIS — Z90710 Acquired absence of both cervix and uterus: Secondary | ICD-10-CM

## 2012-10-04 DIAGNOSIS — Z85830 Personal history of malignant neoplasm of bone: Secondary | ICD-10-CM

## 2012-10-04 DIAGNOSIS — Z8543 Personal history of malignant neoplasm of ovary: Secondary | ICD-10-CM

## 2012-10-04 DIAGNOSIS — D72829 Elevated white blood cell count, unspecified: Secondary | ICD-10-CM

## 2012-10-04 DIAGNOSIS — K219 Gastro-esophageal reflux disease without esophagitis: Secondary | ICD-10-CM | POA: Diagnosis present

## 2012-10-04 DIAGNOSIS — Z888 Allergy status to other drugs, medicaments and biological substances status: Secondary | ICD-10-CM

## 2012-10-04 DIAGNOSIS — J189 Pneumonia, unspecified organism: Principal | ICD-10-CM | POA: Diagnosis present

## 2012-10-04 DIAGNOSIS — Z23 Encounter for immunization: Secondary | ICD-10-CM

## 2012-10-04 DIAGNOSIS — G8929 Other chronic pain: Secondary | ICD-10-CM | POA: Diagnosis present

## 2012-10-04 DIAGNOSIS — Z9089 Acquired absence of other organs: Secondary | ICD-10-CM

## 2012-10-04 DIAGNOSIS — IMO0001 Reserved for inherently not codable concepts without codable children: Secondary | ICD-10-CM | POA: Diagnosis present

## 2012-10-04 DIAGNOSIS — T7491XA Unspecified adult maltreatment, confirmed, initial encounter: Secondary | ICD-10-CM | POA: Diagnosis not present

## 2012-10-04 DIAGNOSIS — R0602 Shortness of breath: Secondary | ICD-10-CM

## 2012-10-04 DIAGNOSIS — IMO0002 Reserved for concepts with insufficient information to code with codable children: Secondary | ICD-10-CM

## 2012-10-04 DIAGNOSIS — I10 Essential (primary) hypertension: Secondary | ICD-10-CM | POA: Diagnosis present

## 2012-10-04 DIAGNOSIS — Z880 Allergy status to penicillin: Secondary | ICD-10-CM

## 2012-10-04 DIAGNOSIS — E669 Obesity, unspecified: Secondary | ICD-10-CM | POA: Diagnosis present

## 2012-10-04 HISTORY — DX: Malignant (primary) neoplasm, unspecified: C80.1

## 2012-10-04 MED ORDER — SODIUM CHLORIDE 0.9 % IV SOLN: 1000.0000 mL | INTRAVENOUS | Status: DC

## 2012-10-04 MED ORDER — SODIUM CHLORIDE 0.9 % IV SOLN: 1000.0000 mL | Freq: Once | INTRAVENOUS | Status: DC

## 2012-10-04 MED ORDER — HYDROMORPHONE HCL PF 1 MG/ML IJ SOLN
1.0000 mg | Freq: Once | INTRAMUSCULAR | Status: AC
  Administered 2012-10-05: 1 mg via INTRAVENOUS
  Filled 2012-10-04: qty 1

## 2012-10-04 MED ORDER — PROMETHAZINE HCL 25 MG/ML IJ SOLN
25.0000 mg | Freq: Once | INTRAMUSCULAR | Status: AC
  Administered 2012-10-05: 25 mg via INTRAVENOUS
  Filled 2012-10-04: qty 1

## 2012-10-04 MED ORDER — SODIUM CHLORIDE 0.9 % IV SOLN
1000.0000 mL | Freq: Once | INTRAVENOUS | Status: AC
  Administered 2012-10-05: 1000 mL via INTRAVENOUS

## 2012-10-04 NOTE — ED Notes (Signed)
Per EMS - pt c/o SOB X few weeks. Pt recently went to PCP for same issue, was dx with pneumonia and put on antibiotics. Pt O2 sats at 88% on room air, given 5mg  of albuterol, pt o2 sats raised to 100% and then put on 2L/min [place]. Pt also reported having some fainting spells recently. BP 113/89 HR 130.

## 2012-10-04 NOTE — ED Notes (Signed)
Patient requested to become XXX, registration notified.

## 2012-10-05 ENCOUNTER — Encounter (HOSPITAL_COMMUNITY): Payer: Self-pay | Admitting: Internal Medicine

## 2012-10-05 ENCOUNTER — Other Ambulatory Visit: Payer: Self-pay

## 2012-10-05 ENCOUNTER — Emergency Department (HOSPITAL_COMMUNITY): Payer: Medicaid Other

## 2012-10-05 DIAGNOSIS — T7491XA Unspecified adult maltreatment, confirmed, initial encounter: Secondary | ICD-10-CM | POA: Diagnosis not present

## 2012-10-05 DIAGNOSIS — R0602 Shortness of breath: Secondary | ICD-10-CM

## 2012-10-05 DIAGNOSIS — E119 Type 2 diabetes mellitus without complications: Secondary | ICD-10-CM

## 2012-10-05 DIAGNOSIS — G8929 Other chronic pain: Secondary | ICD-10-CM | POA: Diagnosis present

## 2012-10-05 DIAGNOSIS — R6889 Other general symptoms and signs: Secondary | ICD-10-CM

## 2012-10-05 DIAGNOSIS — J189 Pneumonia, unspecified organism: Principal | ICD-10-CM

## 2012-10-05 LAB — COMPREHENSIVE METABOLIC PANEL
ALT: 18 U/L (ref 0–35)
Albumin: 2.9 g/dL — ABNORMAL LOW (ref 3.5–5.2)
Alkaline Phosphatase: 136 U/L — ABNORMAL HIGH (ref 39–117)
BUN: 13 mg/dL (ref 6–23)
CO2: 27 mEq/L (ref 19–32)
Calcium: 9 mg/dL (ref 8.4–10.5)
Calcium: 9.7 mg/dL (ref 8.4–10.5)
Chloride: 100 mEq/L (ref 96–112)
Chloride: 98 mEq/L (ref 96–112)
Creatinine, Ser: 0.75 mg/dL (ref 0.50–1.10)
GFR calc Af Amer: >90 mL/min (ref >90)
GFR calc non Af Amer: >90 mL/min (ref >90)
Glucose, Bld: 126 mg/dL — ABNORMAL HIGH (ref 70–99)
Sodium: 137 mEq/L (ref 135–145)
Total Bilirubin: 0.3 mg/dL (ref 0.3–1.2)
Total Bilirubin: 0.4 mg/dL (ref 0.3–1.2)

## 2012-10-05 LAB — CBC WITH DIFFERENTIAL/PLATELET
Basophils Absolute: 0 10*3/uL (ref 0.0–0.1)
Basophils Relative: 0 % (ref 0–1)
Eosinophils Relative: 0 % (ref 0–5)
Eosinophils Relative: 1 % (ref 0–5)
HCT: 37.4 % (ref 36.0–46.0)
Hemoglobin: 10.3 g/dL — ABNORMAL LOW (ref 12.0–15.0)
Lymphocytes Relative: 21 % (ref 12–46)
Lymphocytes Relative: 25 % (ref 12–46)
Lymphs Abs: 3.6 10*3/uL (ref 0.7–4.0)
MCHC: 31.8 g/dL (ref 30.0–36.0)
MCV: 77.1 fL — ABNORMAL LOW (ref 78.0–100.0)
MCV: 78 fL (ref 78.0–100.0)
Monocytes Absolute: 0.7 10*3/uL (ref 0.1–1.0)
Monocytes Relative: 5 % (ref 3–12)
Neutrophils Relative %: 69 % (ref 43–77)
Platelets: 179 10*3/uL (ref 150–400)
RBC: 4.27 MIL/uL (ref 3.87–5.11)
RDW: 14.2 % (ref 11.5–15.5)
WBC: 14 10*3/uL — ABNORMAL HIGH (ref 4.0–10.5)

## 2012-10-05 LAB — INFLUENZA PANEL BY PCR (TYPE A & B)
H1N1 flu by pcr: NOT DETECTED
Influenza A By PCR: NEGATIVE
Influenza B By PCR: NEGATIVE

## 2012-10-05 LAB — GLUCOSE, CAPILLARY
Glucose-Capillary: 104 mg/dL — ABNORMAL HIGH (ref 70–99)
Glucose-Capillary: 98 mg/dL (ref 70–99)
Glucose-Capillary: 97 mg/dL (ref 70–99)

## 2012-10-05 LAB — HEMOGLOBIN A1C
Hgb A1c MFr Bld: 6.6 % — ABNORMAL HIGH (ref <5.7)
Mean Plasma Glucose: 143 mg/dL — ABNORMAL HIGH (ref <117)

## 2012-10-05 MED ORDER — MORPHINE SULFATE 4 MG/ML IJ SOLN
4.0000 mg | Freq: Once | INTRAMUSCULAR | Status: AC
  Administered 2012-10-05: 4 mg via INTRAVENOUS
  Filled 2012-10-05: qty 1

## 2012-10-05 MED ORDER — OXYCODONE-ACETAMINOPHEN 5-325 MG PO TABS
1.0000 | ORAL_TABLET | Freq: Four times a day (QID) | ORAL | Status: DC
  Administered 2012-10-05 – 2012-10-07 (×10): 1 via ORAL
  Filled 2012-10-05 (×11): qty 1

## 2012-10-05 MED ORDER — DILTIAZEM HCL ER COATED BEADS 360 MG PO CP24
360.0000 mg | ORAL_CAPSULE | Freq: Every day | ORAL | Status: DC
  Administered 2012-10-05: 360 mg via ORAL
  Filled 2012-10-05 (×2): qty 1

## 2012-10-05 MED ORDER — MORPHINE SULFATE ER 15 MG PO TBCR
30.0000 mg | EXTENDED_RELEASE_TABLET | Freq: Two times a day (BID) | ORAL | Status: DC
  Administered 2012-10-05 – 2012-10-07 (×4): 30 mg via ORAL
  Filled 2012-10-05: qty 1
  Filled 2012-10-05 (×2): qty 2
  Filled 2012-10-05 (×3): qty 1
  Filled 2012-10-05: qty 2

## 2012-10-05 MED ORDER — LEVOTHYROXINE SODIUM 125 MCG PO TABS
125.0000 ug | ORAL_TABLET | Freq: Every day | ORAL | Status: DC
  Administered 2012-10-05 – 2012-10-07 (×3): 125 ug via ORAL
  Filled 2012-10-05 (×5): qty 1

## 2012-10-05 MED ORDER — POTASSIUM CHLORIDE CRYS ER 20 MEQ PO TBCR
40.0000 meq | EXTENDED_RELEASE_TABLET | Freq: Two times a day (BID) | ORAL | Status: DC
  Administered 2012-10-05 – 2012-10-07 (×5): 40 meq via ORAL
  Filled 2012-10-05 (×6): qty 2

## 2012-10-05 MED ORDER — DEXTROSE 5 % IV SOLN: 500.0000 mg | Freq: Once | INTRAVENOUS | Status: DC

## 2012-10-05 MED ORDER — ALBUTEROL SULFATE (5 MG/ML) 0.5% IN NEBU
2.5000 mg | INHALATION_SOLUTION | Freq: Four times a day (QID) | RESPIRATORY_TRACT | Status: DC | PRN
  Administered 2012-10-07: 2.5 mg via RESPIRATORY_TRACT
  Filled 2012-10-05: qty 0.5

## 2012-10-05 MED ORDER — ACETAMINOPHEN 325 MG PO TABS
650.0000 mg | ORAL_TABLET | Freq: Four times a day (QID) | ORAL | Status: DC | PRN
  Administered 2012-10-06 – 2012-10-07 (×2): 650 mg via ORAL
  Filled 2012-10-05 (×2): qty 2

## 2012-10-05 MED ORDER — TRAZODONE HCL 100 MG PO TABS
100.0000 mg | ORAL_TABLET | Freq: Every day | ORAL | Status: DC
  Administered 2012-10-05 – 2012-10-06 (×2): 100 mg via ORAL
  Filled 2012-10-05 (×3): qty 1

## 2012-10-05 MED ORDER — OXYCODONE-ACETAMINOPHEN 10-325 MG PO TABS: 1.0000 | ORAL_TABLET | Freq: Four times a day (QID) | ORAL | Status: DC

## 2012-10-05 MED ORDER — DEXTROSE 5 % IV SOLN
1.0000 g | Freq: Once | INTRAVENOUS | Status: AC
  Administered 2012-10-05: 1 g via INTRAVENOUS
  Filled 2012-10-05: qty 10

## 2012-10-05 MED ORDER — GABAPENTIN 800 MG PO TABS
800.0000 mg | ORAL_TABLET | Freq: Three times a day (TID) | ORAL | Status: DC
  Administered 2012-10-05 (×2): 800 mg via ORAL
  Filled 2012-10-05 (×3): qty 1

## 2012-10-05 MED ORDER — IOHEXOL 350 MG/ML SOLN
100.0000 mL | Freq: Once | INTRAVENOUS | Status: AC | PRN
  Administered 2012-10-05: 100 mL via INTRAVENOUS

## 2012-10-05 MED ORDER — INFLUENZA VIRUS VACC SPLIT PF IM SUSP
0.5000 mL | INTRAMUSCULAR | Status: AC
  Filled 2012-10-05: qty 0.5

## 2012-10-05 MED ORDER — INSULIN ASPART 100 UNIT/ML ~~LOC~~ SOLN: 0.0000 [IU] | Freq: Every day | SUBCUTANEOUS | Status: DC

## 2012-10-05 MED ORDER — INSULIN ASPART 100 UNIT/ML ~~LOC~~ SOLN
0.0000 [IU] | Freq: Three times a day (TID) | SUBCUTANEOUS | Status: DC
  Administered 2012-10-06 – 2012-10-07 (×4): 2 [IU] via SUBCUTANEOUS

## 2012-10-05 MED ORDER — ONDANSETRON HCL 4 MG/2ML IJ SOLN
4.0000 mg | Freq: Three times a day (TID) | INTRAMUSCULAR | Status: DC | PRN
  Administered 2012-10-05: 4 mg via INTRAVENOUS
  Filled 2012-10-05: qty 2

## 2012-10-05 MED ORDER — OXYCODONE-ACETAMINOPHEN 5-325 MG PO TABS
1.0000 | ORAL_TABLET | Freq: Four times a day (QID) | ORAL | Status: DC | PRN
  Administered 2012-10-05 – 2012-10-06 (×2): 1 via ORAL
  Filled 2012-10-05 (×2): qty 1

## 2012-10-05 MED ORDER — ATORVASTATIN CALCIUM 40 MG PO TABS
40.0000 mg | ORAL_TABLET | Freq: Every day | ORAL | Status: DC
  Administered 2012-10-05 – 2012-10-06 (×2): 40 mg via ORAL
  Filled 2012-10-05 (×3): qty 1

## 2012-10-05 MED ORDER — FLUTICASONE PROPIONATE 50 MCG/ACT NA SUSP: 2.0000 | Freq: Every day | NASAL | Status: DC | PRN

## 2012-10-05 MED ORDER — PANTOPRAZOLE SODIUM 40 MG PO TBEC
40.0000 mg | DELAYED_RELEASE_TABLET | Freq: Every day | ORAL | Status: DC
  Administered 2012-10-05 – 2012-10-07 (×3): 40 mg via ORAL
  Filled 2012-10-05 (×2): qty 1

## 2012-10-05 MED ORDER — DEXTROSE 5 % IV SOLN
1.0000 g | INTRAVENOUS | Status: DC
  Administered 2012-10-06 – 2012-10-07 (×2): 1 g via INTRAVENOUS
  Filled 2012-10-05 (×2): qty 10

## 2012-10-05 MED ORDER — OXYCODONE-ACETAMINOPHEN 5-325 MG PO TABS
2.0000 | ORAL_TABLET | Freq: Once | ORAL | Status: AC
  Administered 2012-10-05: 2 via ORAL
  Filled 2012-10-05: qty 2

## 2012-10-05 MED ORDER — ENOXAPARIN SODIUM 40 MG/0.4ML ~~LOC~~ SOLN
40.0000 mg | SUBCUTANEOUS | Status: DC
  Administered 2012-10-05 – 2012-10-07 (×3): 40 mg via SUBCUTANEOUS
  Filled 2012-10-05 (×3): qty 0.4

## 2012-10-05 MED ORDER — LEVOFLOXACIN IN D5W 750 MG/150ML IV SOLN
750.0000 mg | Freq: Once | INTRAVENOUS | Status: DC
  Filled 2012-10-05: qty 150

## 2012-10-05 MED ORDER — SODIUM CHLORIDE 0.9 % IV SOLN
INTRAVENOUS | Status: AC
  Administered 2012-10-05: 06:00:00 via INTRAVENOUS

## 2012-10-05 MED ORDER — ONDANSETRON HCL 4 MG PO TABS: 4.0000 mg | ORAL_TABLET | Freq: Three times a day (TID) | ORAL | Status: DC | PRN

## 2012-10-05 MED ORDER — GABAPENTIN 400 MG PO CAPS
800.0000 mg | ORAL_CAPSULE | Freq: Three times a day (TID) | ORAL | Status: DC
  Administered 2012-10-05 – 2012-10-07 (×5): 800 mg via ORAL
  Filled 2012-10-05 (×7): qty 2

## 2012-10-05 MED ORDER — OXYCODONE HCL 5 MG PO TABS
5.0000 mg | ORAL_TABLET | Freq: Four times a day (QID) | ORAL | Status: DC
  Administered 2012-10-05 – 2012-10-07 (×10): 5 mg via ORAL
  Filled 2012-10-05 (×11): qty 1

## 2012-10-05 MED ORDER — NEBIVOLOL HCL 10 MG PO TABS
10.0000 mg | ORAL_TABLET | Freq: Every day | ORAL | Status: DC
  Administered 2012-10-05 – 2012-10-07 (×3): 10 mg via ORAL
  Filled 2012-10-05 (×3): qty 1

## 2012-10-05 MED ORDER — ALBUTEROL SULFATE (5 MG/ML) 0.5% IN NEBU
2.5000 mg | INHALATION_SOLUTION | RESPIRATORY_TRACT | Status: AC
  Administered 2012-10-05 (×2): 2.5 mg via RESPIRATORY_TRACT
  Filled 2012-10-05: qty 0.5

## 2012-10-05 MED ORDER — FERROUS GLUCONATE 324 (38 FE) MG PO TABS
324.0000 mg | ORAL_TABLET | Freq: Every day | ORAL | Status: DC
  Administered 2012-10-05 – 2012-10-06 (×2): 324 mg via ORAL
  Filled 2012-10-05 (×3): qty 1

## 2012-10-05 MED ORDER — DEXTROSE 5 % IV SOLN
500.0000 mg | INTRAVENOUS | Status: DC
  Administered 2012-10-05 – 2012-10-07 (×3): 500 mg via INTRAVENOUS
  Filled 2012-10-05 (×3): qty 500

## 2012-10-05 MED ORDER — DIPHENHYDRAMINE HCL 50 MG/ML IJ SOLN
25.0000 mg | Freq: Once | INTRAMUSCULAR | Status: DC
  Filled 2012-10-05: qty 1

## 2012-10-05 MED ORDER — CYCLOBENZAPRINE HCL 10 MG PO TABS
10.0000 mg | ORAL_TABLET | Freq: Three times a day (TID) | ORAL | Status: DC
  Administered 2012-10-05 – 2012-10-07 (×7): 10 mg via ORAL
  Filled 2012-10-05 (×9): qty 1
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
## 2012-10-05 NOTE — Progress Notes (Signed)
  Echocardiogram 2D Echocardiogram has been performed.  Erica Andrews A 10/05/2012, 12:23 PM

## 2012-10-05 NOTE — Progress Notes (Signed)
INITIAL NUTRITION ASSESSMENT  DOCUMENTATION CODES Per approved criteria  -Obesity Unspecified   INTERVENTION:  No nutrition intervention at this time ---> patient declined RD to follow for nutrition care plan  NUTRITION DIAGNOSIS: Inadequate oral intake related to poor appetite, acute illness as evidenced by patient report  Goal: Oral intake with meals to meet >/= 90% of estimated nutrition needs  Monitor:  PO intake, weight, labs, I/O's  Reason for Assessment: Malnutrition Screening Tool Report  32 y.o. female  Admitting Dx: pneumonia   ASSESSMENT: Patient presented with 2 week hx of SOB and increased cough productive of gray mucus with occasional streeks of blood; reports a poor appetite; no % meal intake recorded; did consume some of lunch per tray observation; reports suboptimal intake PTA with weight loss, however, question accuracy of amount; declining addition of supplements at this time.  Height: Ht Readings from Last 1 Encounters:  10/05/12 5\' 2"  (1.575 m)    Weight: Wt Readings from Last 1 Encounters:  10/05/12 209 lb 1.6 oz (94.847 kg)    Ideal Body Weight: 110 lb  % Ideal Body Weight: 190%  Wt Readings from Last 10 Encounters:  10/05/12 209 lb 1.6 oz (94.847 kg)  04/09/12 200 lb (90.719 kg)    Usual Body Weight: 200 lb  % Usual Body Weight: 105%  BMI:  Body mass index is 38.24 kg/(m^2).  Estimated Nutritional Needs: Kcal: 1700-1900 Protein: 80-90 gm Fluid: 1.6-1.8 L  Skin: Intact  Diet Order: Carb Control  EDUCATION NEEDS: -No education needs identified at this time  Labs:   Lab 10/05/12 0655 10/04/12 2348  NA 139 137  K 4.1 3.5  CL 100 98  CO2 27 27  BUN 13 13  CREATININE 0.75 0.75  CALCIUM 9.0 9.7  MG -- --  PHOS -- --  GLUCOSE 120* 126*    CBG (last 3)   Basename 10/05/12 1137 10/05/12 0718 10/05/12 0447  GLUCAP 98 113* 104*    Scheduled Meds:   . albuterol  2.5 mg Nebulization Q4H  . atorvastatin  40 mg Oral  q1800  . azithromycin  500 mg Intravenous Q24H  . cefTRIAXone (ROCEPHIN)  IV  1 g Intravenous Q24H  . cyclobenzaprine  10 mg Oral TID  . diltiazem  360 mg Oral Daily  . enoxaparin (LOVENOX) injection  40 mg Subcutaneous Q24H  . ferrous gluconate  324 mg Oral QPC supper  . gabapentin  800 mg Oral TID  . influenza  inactive virus vaccine  0.5 mL Intramuscular Tomorrow-1000  . insulin aspart  0-15 Units Subcutaneous TID WC  . insulin aspart  0-5 Units Subcutaneous QHS  . levothyroxine  125 mcg Oral QAC breakfast  . morphine  30 mg Oral BID  . nebivolol  10 mg Oral Daily  . oxyCODONE-acetaminophen  1 tablet Oral QID   And  . oxyCODONE  5 mg Oral QID  . pantoprazole  40 mg Oral Daily  . potassium chloride SA  40 mEq Oral BID  . traZODone  100 mg Oral QHS    Continuous Infusions:   Past Medical History  Diagnosis Date  . Diabetes mellitus   . Pulmonary embolism   . CHF (congestive heart failure)   . Hypertension   . COPD (chronic obstructive pulmonary disease)   . Pneumonia   . GERD (gastroesophageal reflux disease)   . Hypokalemia   . Congestive heart failure   . Fibromyalgia   . Renal disorder     renal failure   .  Cancer     ovarian    Past Surgical History  Procedure Date  . Cholecystectomy   . Appendectomy   . Abdominal hysterectomy     Maureen Chatters, RD, LDN Pager #: [phone] After-Hours Pager #: [phone]

## 2012-10-05 NOTE — Progress Notes (Signed)
Utilization review completed.  

## 2012-10-05 NOTE — ED Notes (Signed)
MD at bedside. 

## 2012-10-05 NOTE — Care Management Note (Signed)
    Page 1 of 1   10/05/2012     3:12:21 PM   CARE MANAGEMENT NOTE 10/05/2012  Patient:  Erica Andrews, Erica Andrews   Account Number:  [id number]  Date Initiated:  10/05/2012  Documentation initiated by:  GRAVES-BIGELOW,Kaycie Pegues  Subjective/Objective Assessment:   Pt admitted for SOB. Treating for PNA.     Action/Plan:   CM did speak to pt in referrence to medication referral. Pt has medicaid and is not eligible for any assistance at this time. Pt usually gets medications for 3.00.   Anticipated DC Date:  10/06/2012   Anticipated DC Plan:  HOME/SELF CARE      DC Planning Services  CM consult      Choice offered to / List presented to:             Status of service:  Completed, signed off Medicare Important Message given?   (If response is "NO", the following Medicare IM given date fields will be blank) Date Medicare IM given:   Date Additional Medicare IM given:    Discharge Disposition:  HOME/SELF CARE  Per UR Regulation:  Reviewed for med. necessity/level of care/duration of stay  If discussed at Long Length of Stay Meetings, dates discussed:    Comments:
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
## 2012-10-05 NOTE — ED Provider Notes (Signed)
History     CSN: [id number]  Arrival date & time 10/04/12  2305   First MD Initiated Contact with Patient 10/04/12 2340      Chief Complaint  Patient presents with  . Shortness of Breath    (Consider location/radiation/quality/duration/timing/severity/associated sxs/prior treatment) HPIMelissa Andrews is a 32 y.o. female who to the area from [place] approximately one year ago we has an extensive medical history including diabetes, 6 pulmonary embolisms in the past, congestive heart failure, hypokalemia, COPD, renal failure and fibromyalgia.  The patient presents to the emergency department via EMS today with shortness of breath for a few weeks. She's had cough productive of rusty colored sputum as well as gray sputum, subjective fevers and was given a prescription for chest x-ray and antibiotics. She's been taking her doxycycline and she was given but has not had a chest x-ray done. Patient's oxygen saturations on room air were 88%. Patient has had multiple episodes of syncope recently. She is also currently complaining about chest pain is worse on coughing and breathing. No headaches, mild left lower quadrant abdominal pain, no dysuria or frequency. Patient does have a history of kidney stones also.   Past Medical History  Diagnosis Date  . Diabetes mellitus   . Pulmonary embolism   . CHF (congestive heart failure)   . Hypertension   . COPD (chronic obstructive pulmonary disease)   . Pneumonia   . GERD (gastroesophageal reflux disease)   . Hypokalemia   . Congestive heart failure   . Fibromyalgia   . Renal disorder     renal failure   . Cancer     ovarian    Past Surgical History  Procedure Date  . Cholecystectomy   . Appendectomy   . Abdominal hysterectomy     No family history on file.  History  Substance Use Topics  . Smoking status: Never Smoker   . Smokeless tobacco: Not on file  . Alcohol Use: No    OB History    Grav Para Term Preterm Abortions TAB SAB  Ect Mult Living                  Review of Systems At least 10pt or greater review of systems completed and are negative except where specified in the HPI.  Allergies  Penicillins; Ddavp; Naproxen; Polio virus vaccine live oral trivalent; Zanaflex; and Levaquin  Home Medications   Current Outpatient Rx  Name  Route  Sig  Dispense  Refill  . ALBUTEROL SULFATE HFA 108 (90 BASE) MCG/ACT IN AERS   Inhalation   Inhale 2 puffs into the lungs every 6 (six) hours as needed. Shortness of breath         . ALISKIREN-HYDROCHLOROTHIAZIDE 150-12.5 MG PO TABS   Oral   Take 1 tablet by mouth daily.          . ATORVASTATIN CALCIUM 40 MG PO TABS   Oral   Take 40 mg by mouth daily.         Marland Kitchen CLONIDINE HCL 0.1 MG PO TABS   Oral   Take 0.1 mg by mouth 2 (two) times daily.         . CYCLOBENZAPRINE HCL 10 MG PO TABS   Oral   Take 10 mg by mouth 3 (three) times daily.         Marland Kitchen DILTIAZEM HCL ER COATED BEADS 360 MG PO CP24   Oral   Take 360 mg by mouth daily.         Marland Kitchen  DOXYCYCLINE HYCLATE 100 MG PO TABS   Oral   Take 100 mg by mouth 2 (two) times daily.         Marland Kitchen FERROUS GLUCONATE 324 (38 FE) MG PO TABS   Oral   Take 324 mg by mouth daily after supper.         Marland Kitchen FLUTICASONE PROPIONATE 50 MCG/ACT NA SUSP   Nasal   Place 2 sprays into the nose daily as needed. For congestion         . GABAPENTIN 800 MG PO TABS   Oral   Take 800 mg by mouth 3 (three) times daily.         . IBUPROFEN 800 MG PO TABS   Oral   Take 800 mg by mouth every 8 (eight) hours as needed. For pain         . LEVOTHYROXINE SODIUM 125 MCG PO TABS   Oral   Take 125 mcg by mouth daily.         Marland Kitchen LISINOPRIL 40 MG PO TABS   Oral   Take 40 mg by mouth daily.         Marland Kitchen METFORMIN HCL 500 MG PO TABS   Oral   Take 500 mg by mouth 2 (two) times daily with a meal.         . MORPHINE SULFATE ER 15 MG PO TBCR   Oral   Take 30 mg by mouth 2 (two) times daily.          . NEBIVOLOL HCL  10 MG PO TABS   Oral   Take 10 mg by mouth daily.         Marland Kitchen OLMESARTAN MEDOXOMIL 20 MG PO TABS   Oral   Take 20 mg by mouth daily.         Marland Kitchen OMEPRAZOLE 20 MG PO CPDR   Oral   Take 20 mg by mouth 2 (two) times daily.          Marland Kitchen ONDANSETRON HCL 4 MG PO TABS   Oral   Take 4 mg by mouth every 8 (eight) hours as needed. nausea         . OXYCODONE-ACETAMINOPHEN 10-325 MG PO TABS   Oral   Take 1 tablet by mouth 4 (four) times daily.          Marland Kitchen POTASSIUM CHLORIDE CRYS ER 20 MEQ PO TBCR   Oral   Take 40 mEq by mouth 2 (two) times daily.          Marland Kitchen PROMETHAZINE HCL 25 MG PO TABS   Oral   Take 25 mg by mouth every 8 (eight) hours as needed. For nausea         . SUMATRIPTAN SUCCINATE 100 MG PO TABS   Oral   Take 100 mg by mouth every 2 (two) hours as needed. migraines         . TRAZODONE HCL 100 MG PO TABS   Oral   Take 100 mg by mouth at bedtime.         . ALBUTEROL SULFATE (5 MG/ML) 0.5% IN NEBU   Nebulization   Take 2.5 mg by nebulization every 6 (six) hours as needed.         Marland Kitchen EPINEPHRINE 0.3 MG/0.3ML IJ DEVI   Intramuscular   Inject 0.3 mg into the muscle once. Allergic reaction to peanuts         . NITROGLYCERIN 0.4 MG SL SUBL  Sublingual   Place 0.4 mg under the tongue every 5 (five) minutes as needed. Chest pain           BP 115/69  Pulse 117  Temp 98.8 F (37.1 C) (Oral)  Resp 16  SpO2 96%  Physical Exam  Nursing notes reviewed.  Electronic medical record reviewed. VITAL SIGNS:   Filed Vitals:   10/04/12 2313 10/05/12 0030 10/05/12 0045  BP: 120/76 117/74 115/69  Pulse:   117  Temp: 98.8 F (37.1 C)    TempSrc: Oral    Resp: 16 20 16   SpO2: 95% 96% 96%   CONSTITUTIONAL: Awake, oriented x4, appears non-toxic, in no apparent distress breathing comfortably on 2 L oxygen by nasal cannula patient has a good O2 waveform and 96%. HENT: Atraumatic, normocephalic, oral mucosa pink and moist, airway patent. Nares patent without  drainage. External ears normal. EYES: Conjunctiva clear, EOMI, PERRLA NECK: Trachea midline, non-tender, supple CARDIOVASCULAR: Normal heart rate, Normal rhythm, No murmurs, rubs, gallops PULMONARY/CHEST: Clear to auscultation, no rhonchi, wheezes, or rales. Symmetrical breath sounds. Non-tender. ABDOMINAL: Non-distended, soft, obese, non-tender - no rebound or guarding.  BS normal. NEUROLOGIC: Non-focal, moving all four extremities, no gross sensory or motor deficits. EXTREMITIES: No clubbing, cyanosis, or edema SKIN: Warm, Dry, No erythema, No rash  ED Course  Procedures (including critical care time)  Labs Reviewed  CBC WITH DIFFERENTIAL - Abnormal; Notable for the following:    WBC 16.5 (*)     Hemoglobin 11.9 (*)     MCV 77.1 (*)     MCH 24.5 (*)     Neutro Abs 12.2 (*)     All other components within normal limits  COMPREHENSIVE METABOLIC PANEL - Abnormal; Notable for the following:    Glucose, Bld 126 (*)     Albumin 3.4 (*)     Alkaline Phosphatase 136 (*)     All other components within normal limits  GLUCOSE, CAPILLARY - Abnormal; Notable for the following:    Glucose-Capillary 104 (*)     All other components within normal limits  TROPONIN I  PRO B NATRIURETIC PEPTIDE  URINALYSIS, ROUTINE W REFLEX MICROSCOPIC  CULTURE, BLOOD (ROUTINE X 2)  CULTURE, BLOOD (ROUTINE X 2)  URINE RAPID DRUG SCREEN (HOSP PERFORMED)  INFLUENZA PANEL BY PCR  HIV ANTIBODY (ROUTINE TESTING)  CULTURE, EXPECTORATED SPUTUM-ASSESSMENT  GRAM STAIN  LEGIONELLA ANTIGEN, URINE  STREP PNEUMONIAE URINARY ANTIGEN  COMPREHENSIVE METABOLIC PANEL  CBC  CBC WITH DIFFERENTIAL  HEMOGLOBIN A1C   Ct Angio Chest W/cm &/or Wo Cm  10/05/2012  *RADIOLOGY REPORT*  Clinical Data: Shortness of breath.  CT ANGIOGRAPHY CHEST  Technique:  Multidetector CT imaging of the chest using the standard protocol during bolus administration of intravenous contrast. Multiplanar reconstructed images including MIPs were  obtained and reviewed to evaluate the vascular anatomy.  Contrast: OMNIPAQUE IOHEXOL 350 MG/ML SOLN  Comparison: CT chest 03/10/2012.  Findings: No pulmonary embolus is identified.  There is no pleural or pericardial effusion.  No axillary, hilar or mediastinal lymphadenopathy.  Lungs demonstrate dependent airspace disease in the bases, greater on the right.  Scattered ground-glass attenuation is also seen.  0.4 cm ground-glass attenuation nodule in the right middle lobe is unchanged.  Incidentally imaged upper abdomen demonstrates postoperative change of cholecystectomy.  There is fatty infiltration of the liver.  No focal bony abnormality is identified.  IMPRESSION:  1.  Negative for pulmonary embolus. 2.  Dependent bibasilar airspace disease, greater on the right, is likely  due to atelectasis although pneumonia could create a similar appearance. 3.  Scattered ground-glass attenuation is nonspecific but can be seen in atelectasis, edema or hypersensitivity pneumonitis. 4.  Fatty infiltration of the liver.   Original Report Authenticated By: Holley Dexter, M.D.      1. Shortness of breath   2. CAP (community acquired pneumonia)   3. Chronic pain   4. Diabetes mellitus       MDM  Erica Andrews is a 32 y.o. female presenting with shortness of breath, cough, fever. Patient may have a pneumonia, however symptoms are also concerning for pulmonary embolism especially with prior PEs. Obtain CT angiogram, negative for pulmonary embolism, she does have some bibasilar atelectasis versus infiltrate worse on the right, we'll treat this as a community-acquired pneumonia. Patient has a listed anaphylactic reaction to penicillin - however, cross reactivity with a third-generation cephalosporin is negligible per recent literature, we'll give her Rocephin which she also says she's had before and tolerated well. Also give her azithromycin to cover atypicals. She does have an elevated white count of 16.5,  labs are otherwise unremarkable. Patient is not septic and do not think she is a lactate.  Discussed with [facility] hospitalist for admission.          Jones Skene, MD 10/05/12 [phone]
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
## 2012-10-05 NOTE — ED Notes (Signed)
Patient returned from CT
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
## 2012-10-06 DIAGNOSIS — D72829 Elevated white blood cell count, unspecified: Secondary | ICD-10-CM

## 2012-10-06 LAB — URINALYSIS, ROUTINE W REFLEX MICROSCOPIC
Bilirubin Urine: NEGATIVE
Glucose, UA: NEGATIVE mg/dL
Hgb urine dipstick: NEGATIVE
Specific Gravity, Urine: 1.025 (ref 1.005–1.030)
Urobilinogen, UA: 0.2 mg/dL (ref 0.0–1.0)

## 2012-10-06 LAB — RAPID URINE DRUG SCREEN, HOSP PERFORMED
Barbiturates: NOT DETECTED
Benzodiazepines: POSITIVE — AB

## 2012-10-06 LAB — GLUCOSE, CAPILLARY: Glucose-Capillary: 124 mg/dL — ABNORMAL HIGH (ref 70–99)

## 2012-10-06 MED ORDER — DILTIAZEM HCL ER COATED BEADS 300 MG PO CP24: 300.0000 mg | ORAL_CAPSULE | Freq: Every day | ORAL | Status: DC

## 2012-10-06 MED ORDER — DILTIAZEM HCL ER COATED BEADS 240 MG PO CP24
240.0000 mg | ORAL_CAPSULE | Freq: Every day | ORAL | Status: DC
  Administered 2012-10-06: 240 mg via ORAL
  Filled 2012-10-06 (×2): qty 1

## 2012-10-06 NOTE — Progress Notes (Signed)
Patient ambulated in hallway without oxygen.  Oxygen saturations prior to ambulation 97% @ 1L, during ambulation oxygen sats ranged from 88% to 92% RA.  Oxygen saturations at the end of ambulation 95% @ 1L.  Patient C/O fatigue, but no SOB.
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
## 2012-10-07 LAB — GLUCOSE, CAPILLARY

## 2012-10-07 LAB — LEGIONELLA ANTIGEN, URINE: Legionella Antigen, Urine: NEGATIVE

## 2012-10-07 MED ORDER — AZITHROMYCIN 500 MG PO TABS
500.0000 mg | ORAL_TABLET | Freq: Every day | ORAL | Status: DC
  Administered 2012-10-07: 500 mg via ORAL
  Filled 2012-10-07: qty 1

## 2012-10-07 MED ORDER — DILTIAZEM HCL ER COATED BEADS 180 MG PO CP24
180.0000 mg | ORAL_CAPSULE | Freq: Every day | ORAL | Status: DC
  Administered 2012-10-07: 180 mg via ORAL
  Filled 2012-10-07: qty 1

## 2012-10-07 MED ORDER — AZITHROMYCIN 500 MG PO TABS: 500.0000 mg | ORAL_TABLET | Freq: Every day | ORAL | Status: DC

## 2012-10-07 MED ORDER — DILTIAZEM HCL ER COATED BEADS 180 MG PO CP24: 180.0000 mg | ORAL_CAPSULE | Freq: Every day | ORAL | Status: DC

## 2012-10-07 NOTE — Progress Notes (Signed)
Patient ambulated in hallway without oxygen.  O2 sats prior to ambulation 97%RA, during ambulation 93 to 98% RA and at the end 97% RA.  Patient did not complain of fatigue or SOB.
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
## 2012-10-11 LAB — CULTURE, BLOOD (ROUTINE X 2): Culture: NO GROWTH

## 2012-10-23 ENCOUNTER — Emergency Department (HOSPITAL_COMMUNITY): Payer: Medicaid Other

## 2012-10-23 ENCOUNTER — Encounter (HOSPITAL_COMMUNITY): Payer: Self-pay | Admitting: Adult Health

## 2012-10-23 ENCOUNTER — Emergency Department (HOSPITAL_COMMUNITY)
Admission: EM | Admit: 2012-10-23 | Discharge: 2012-10-23 | Disposition: A | Discharge status: Home / Self Care | Payer: Medicaid Other | Attending: Emergency Medicine | Admitting: Emergency Medicine

## 2012-10-23 DIAGNOSIS — Z8543 Personal history of malignant neoplasm of ovary: Secondary | ICD-10-CM | POA: Insufficient documentation

## 2012-10-23 DIAGNOSIS — E119 Type 2 diabetes mellitus without complications: Secondary | ICD-10-CM | POA: Insufficient documentation

## 2012-10-23 DIAGNOSIS — S2001XA Contusion of right breast, initial encounter: Secondary | ICD-10-CM

## 2012-10-23 DIAGNOSIS — W19XXXA Unspecified fall, initial encounter: Secondary | ICD-10-CM

## 2012-10-23 DIAGNOSIS — Y9389 Activity, other specified: Secondary | ICD-10-CM | POA: Insufficient documentation

## 2012-10-23 DIAGNOSIS — J449 Chronic obstructive pulmonary disease, unspecified: Secondary | ICD-10-CM | POA: Insufficient documentation

## 2012-10-23 DIAGNOSIS — I10 Essential (primary) hypertension: Secondary | ICD-10-CM | POA: Insufficient documentation

## 2012-10-23 DIAGNOSIS — Z862 Personal history of diseases of the blood and blood-forming organs and certain disorders involving the immune mechanism: Secondary | ICD-10-CM | POA: Insufficient documentation

## 2012-10-23 DIAGNOSIS — N19 Unspecified kidney failure: Secondary | ICD-10-CM | POA: Insufficient documentation

## 2012-10-23 DIAGNOSIS — IMO0002 Reserved for concepts with insufficient information to code with codable children: Secondary | ICD-10-CM | POA: Insufficient documentation

## 2012-10-23 DIAGNOSIS — Z86711 Personal history of pulmonary embolism: Secondary | ICD-10-CM | POA: Insufficient documentation

## 2012-10-23 DIAGNOSIS — Z79899 Other long term (current) drug therapy: Secondary | ICD-10-CM | POA: Insufficient documentation

## 2012-10-23 DIAGNOSIS — Z8639 Personal history of other endocrine, nutritional and metabolic disease: Secondary | ICD-10-CM | POA: Insufficient documentation

## 2012-10-23 DIAGNOSIS — K219 Gastro-esophageal reflux disease without esophagitis: Secondary | ICD-10-CM | POA: Insufficient documentation

## 2012-10-23 DIAGNOSIS — Z8701 Personal history of pneumonia (recurrent): Secondary | ICD-10-CM | POA: Insufficient documentation

## 2012-10-23 DIAGNOSIS — S2000XA Contusion of breast, unspecified breast, initial encounter: Secondary | ICD-10-CM | POA: Insufficient documentation

## 2012-10-23 DIAGNOSIS — W07XXXA Fall from chair, initial encounter: Secondary | ICD-10-CM | POA: Insufficient documentation

## 2012-10-23 DIAGNOSIS — Z8739 Personal history of other diseases of the musculoskeletal system and connective tissue: Secondary | ICD-10-CM | POA: Insufficient documentation

## 2012-10-23 DIAGNOSIS — Y929 Unspecified place or not applicable: Secondary | ICD-10-CM | POA: Insufficient documentation

## 2012-10-23 DIAGNOSIS — S2002XA Contusion of left breast, initial encounter: Secondary | ICD-10-CM

## 2012-10-23 DIAGNOSIS — I509 Heart failure, unspecified: Secondary | ICD-10-CM | POA: Insufficient documentation

## 2012-10-23 DIAGNOSIS — J4489 Other specified chronic obstructive pulmonary disease: Secondary | ICD-10-CM | POA: Insufficient documentation

## 2012-10-23 MED ORDER — HYDROMORPHONE HCL 2 MG PO TABS
2.0000 mg | ORAL_TABLET | ORAL | Status: AC
  Administered 2012-10-23: 2 mg via ORAL
  Filled 2012-10-23: qty 1

## 2012-10-23 NOTE — ED Notes (Signed)
Pt instructed not to take home pain medications until tomorrow d/t 2mg  Dilaudid administrated in ED, instructions verified by Preston Fleeting, MD prior to pt education on when to take pain meds, pt & family verbalizes understanding

## 2012-10-23 NOTE — ED Provider Notes (Signed)
History  This chart was scribed for Dione Booze, MD by Marlin Canary ED Scribe. The patient was seen in room TR08C/TR08C. Patient's care was started at 1724.  CSN: [id number]  Arrival date & time 10/23/12  1532   First MD Initiated Contact with Patient 10/23/12 1724      Chief Complaint  Patient presents with  . Fall     The history is provided by the patient. No language interpreter was used.  Erica Andrews is a 32 y.o. female who presents to the Emergency Department complaining of constant bilateral breast bruising with associated breast pain after a fall while standing on a chair that occurred 3-4 hours ago. Left breast is more severe than right and states that she landed directly on her breasts. Pt rates the pain an 8/10. She applied ice to the affected area for mild symptom relief. Her pain is aggravated with breathing. Pt says she takes 10 mg percocet regularly for chronic back and legs and took some PTA with some relief. Pt is followed by pain management. She denies any other injuries.   Past Medical History  Diagnosis Date  . Diabetes mellitus   . Pulmonary embolism   . CHF (congestive heart failure)   . Hypertension   . COPD (chronic obstructive pulmonary disease)   . Pneumonia   . GERD (gastroesophageal reflux disease)   . Hypokalemia   . Congestive heart failure   . Fibromyalgia   . Renal disorder     renal failure   . Cancer     ovarian    Past Surgical History  Procedure Date  . Cholecystectomy   . Appendectomy   . Abdominal hysterectomy   . Total hip arthroplasty     History reviewed. No pertinent family history.  History  Substance Use Topics  . Smoking status: Never Smoker   . Smokeless tobacco: Not on file  . Alcohol Use: No    OB History    Grav Para Term Preterm Abortions TAB SAB Ect Mult Living                  Review of Systems  Musculoskeletal:       Bilateral breast pain.   Skin: Positive for color change.  All other systems  reviewed and are negative.    Allergies  Penicillins; Ddavp; Naproxen; Polio virus vaccine live oral trivalent; Sweet potato; Zanaflex; and Levaquin  Home Medications   Current Outpatient Rx  Name  Route  Sig  Dispense  Refill  . ALBUTEROL SULFATE HFA 108 (90 BASE) MCG/ACT IN AERS   Inhalation   Inhale 2 puffs into the lungs every 6 (six) hours as needed. Shortness of breath         . ATORVASTATIN CALCIUM 40 MG PO TABS   Oral   Take 40 mg by mouth daily.         . CYCLOBENZAPRINE HCL 10 MG PO TABS   Oral   Take 10 mg by mouth 3 (three) times daily.         Marland Kitchen DILTIAZEM HCL ER COATED BEADS 180 MG PO CP24   Oral   Take 1 capsule (180 mg total) by mouth daily.   30 capsule   0   . EPINEPHRINE 0.3 MG/0.3ML IJ DEVI   Intramuscular   Inject 0.3 mg into the muscle once. Allergic reaction to peanuts         . FERROUS GLUCONATE 324 (38 FE) MG PO TABS   Oral  Take 324 mg by mouth daily after supper.         Marland Kitchen FLUTICASONE PROPIONATE 50 MCG/ACT NA SUSP   Nasal   Place 2 sprays into the nose daily as needed. For congestion         . GABAPENTIN 800 MG PO TABS   Oral   Take 800 mg by mouth 3 (three) times daily.         Marland Kitchen LEVOTHYROXINE SODIUM 125 MCG PO TABS   Oral   Take 125 mcg by mouth daily.         Marland Kitchen METFORMIN HCL 500 MG PO TABS   Oral   Take 500 mg by mouth 2 (two) times daily with a meal.         . MORPHINE SULFATE ER 15 MG PO TBCR   Oral   Take 30 mg by mouth 2 (two) times daily.          Marland Kitchen NITROGLYCERIN 0.4 MG SL SUBL   Sublingual   Place 0.4 mg under the tongue every 5 (five) minutes as needed. Chest pain         . OMEPRAZOLE 20 MG PO CPDR   Oral   Take 20 mg by mouth 2 (two) times daily.          Marland Kitchen ONDANSETRON HCL 4 MG PO TABS   Oral   Take 4 mg by mouth every 8 (eight) hours as needed. nausea         . OXYCODONE HCL 5 MG PO TABS   Oral   Take 10 mg by mouth every 4 (four) hours as needed. For pain         . POTASSIUM  CHLORIDE CRYS ER 20 MEQ PO TBCR   Oral   Take 40 mEq by mouth 2 (two) times daily.         . SUMATRIPTAN SUCCINATE 100 MG PO TABS   Oral   Take 100 mg by mouth every 2 (two) hours as needed. migraines         . TRAZODONE HCL 100 MG PO TABS   Oral   Take 100 mg by mouth at bedtime.           BP 158/119  Pulse 88  Temp 97.8 F (36.6 C) (Oral)  Resp 16  SpO2 94%  Physical Exam  Nursing note and vitals reviewed. Constitutional: She is oriented to person, place, and time. She appears well-developed and well-nourished. No distress.  HENT:  Head: Normocephalic and atraumatic.  Eyes: Conjunctivae normal and EOM are normal.  Neck: Neck supple. No tracheal deviation present.  Cardiovascular: Normal rate, regular rhythm and normal heart sounds.   Pulmonary/Chest: Effort normal and breath sounds normal. No respiratory distress. She has no wheezes.  Musculoskeletal: Normal range of motion. She exhibits no edema.  Neurological: She is alert and oriented to person, place, and time.  Skin: Skin is warm and dry.       Ecchymosis on the superior aspects of both breast, left more than right.   Psychiatric: She has a normal mood and affect. Her behavior is normal.    ED Course  Procedures (including critical care time) DIAGNOSTIC STUDIES: Oxygen Saturation is 94% on room air, Adequate by my interpretation.    COORDINATION OF CARE: 1733-Patient informed of current plan for treatment and evaluation and agrees with plan at this time.   Labs Reviewed - No data to display Dg Chest 2 View  10/23/2012  *RADIOLOGY  REPORT*  Clinical Data: Fall.  Left breast bruising.  CHEST - 2 VIEW  Comparison: 03/10/2012  Findings: Linear atelectasis or scarring within the lingula.  Right lung is clear.  Heart is normal size.  No effusions or acute bony abnormality. No visible rib fracture.  No pneumothorax.  IMPRESSION: Lingular scarring or atelectasis.   Original Report Authenticated By: Charlett Nose, M.D.       1. Fall   2. Contusion of left breast   3. Contusion of right breast       MDM  Contusions of the breast without evidence of other injury. Patient is reassured. She takes Percocet 10 mg at home for chronic pain and is allergic to NSAIDs. I've advised her that there is no other medication I can offer her and that she should be aggressive with use of ice and take her Percocet as needed.      I personally performed the services described in this documentation, which was scribed in my presence. The recorded information has been reviewed and is accurate.      Dione Booze, MD 10/23/12 [phone]

## 2012-10-23 NOTE — ED Notes (Addendum)
Presents with fall at 340pm  into a chair today. Right breast with old bruising, Left breast with large hematoma. Bilateral lung sounds clear, no difficulty breathing, no SOB.

## 2013-04-30 ENCOUNTER — Emergency Department (HOSPITAL_COMMUNITY)
Admission: EM | Admit: 2013-04-30 | Discharge: 2013-04-30 | Disposition: A | Discharge status: Home / Self Care | Payer: Medicaid Other | Attending: Emergency Medicine | Admitting: Emergency Medicine

## 2013-04-30 ENCOUNTER — Emergency Department (HOSPITAL_COMMUNITY): Payer: Medicaid Other

## 2013-04-30 ENCOUNTER — Encounter (HOSPITAL_COMMUNITY): Payer: Self-pay

## 2013-04-30 DIAGNOSIS — Z86711 Personal history of pulmonary embolism: Secondary | ICD-10-CM | POA: Insufficient documentation

## 2013-04-30 DIAGNOSIS — Z8701 Personal history of pneumonia (recurrent): Secondary | ICD-10-CM | POA: Insufficient documentation

## 2013-04-30 DIAGNOSIS — Z880 Allergy status to penicillin: Secondary | ICD-10-CM | POA: Insufficient documentation

## 2013-04-30 DIAGNOSIS — Z8639 Personal history of other endocrine, nutritional and metabolic disease: Secondary | ICD-10-CM | POA: Insufficient documentation

## 2013-04-30 DIAGNOSIS — Z8719 Personal history of other diseases of the digestive system: Secondary | ICD-10-CM | POA: Insufficient documentation

## 2013-04-30 DIAGNOSIS — Z8739 Personal history of other diseases of the musculoskeletal system and connective tissue: Secondary | ICD-10-CM | POA: Insufficient documentation

## 2013-04-30 DIAGNOSIS — I10 Essential (primary) hypertension: Secondary | ICD-10-CM | POA: Insufficient documentation

## 2013-04-30 DIAGNOSIS — R0789 Other chest pain: Secondary | ICD-10-CM | POA: Insufficient documentation

## 2013-04-30 DIAGNOSIS — Z85528 Personal history of other malignant neoplasm of kidney: Secondary | ICD-10-CM | POA: Insufficient documentation

## 2013-04-30 DIAGNOSIS — J4489 Other specified chronic obstructive pulmonary disease: Secondary | ICD-10-CM | POA: Insufficient documentation

## 2013-04-30 DIAGNOSIS — I509 Heart failure, unspecified: Secondary | ICD-10-CM | POA: Insufficient documentation

## 2013-04-30 DIAGNOSIS — K219 Gastro-esophageal reflux disease without esophagitis: Secondary | ICD-10-CM | POA: Insufficient documentation

## 2013-04-30 DIAGNOSIS — Z862 Personal history of diseases of the blood and blood-forming organs and certain disorders involving the immune mechanism: Secondary | ICD-10-CM | POA: Insufficient documentation

## 2013-04-30 DIAGNOSIS — IMO0001 Reserved for inherently not codable concepts without codable children: Secondary | ICD-10-CM | POA: Insufficient documentation

## 2013-04-30 DIAGNOSIS — Z87448 Personal history of other diseases of urinary system: Secondary | ICD-10-CM | POA: Insufficient documentation

## 2013-04-30 DIAGNOSIS — R112 Nausea with vomiting, unspecified: Secondary | ICD-10-CM | POA: Insufficient documentation

## 2013-04-30 DIAGNOSIS — J449 Chronic obstructive pulmonary disease, unspecified: Secondary | ICD-10-CM | POA: Insufficient documentation

## 2013-04-30 DIAGNOSIS — Z79899 Other long term (current) drug therapy: Secondary | ICD-10-CM | POA: Insufficient documentation

## 2013-04-30 DIAGNOSIS — IMO0002 Reserved for concepts with insufficient information to code with codable children: Secondary | ICD-10-CM | POA: Insufficient documentation

## 2013-04-30 DIAGNOSIS — E119 Type 2 diabetes mellitus without complications: Secondary | ICD-10-CM | POA: Insufficient documentation

## 2013-04-30 HISTORY — DX: Hepatic failure, unspecified without coma: K72.90

## 2013-04-30 HISTORY — DX: Rhabdomyolysis: M62.82

## 2013-04-30 LAB — CBC
Hemoglobin: 12.5 g/dL (ref 12.0–15.0)
MCH: 24.9 pg — ABNORMAL LOW (ref 26.0–34.0)
MCHC: 33.6 g/dL (ref 30.0–36.0)
MCV: 74 fL — ABNORMAL LOW (ref 78.0–100.0)
RBC: 5.03 MIL/uL (ref 3.87–5.11)

## 2013-04-30 LAB — COMPREHENSIVE METABOLIC PANEL
ALT: 73 U/L — ABNORMAL HIGH (ref 0–35)
AST: 72 U/L — ABNORMAL HIGH (ref 0–37)
Alkaline Phosphatase: 211 U/L — ABNORMAL HIGH (ref 39–117)
CO2: 27 mEq/L (ref 19–32)
Chloride: 98 mEq/L (ref 96–112)
GFR calc non Af Amer: >90 mL/min (ref >90)
Glucose, Bld: 119 mg/dL — ABNORMAL HIGH (ref 70–99)
Potassium: 3.4 mEq/L — ABNORMAL LOW (ref 3.5–5.1)
Sodium: 138 mEq/L (ref 135–145)
Total Bilirubin: 0.4 mg/dL (ref 0.3–1.2)

## 2013-04-30 LAB — TYPE AND SCREEN

## 2013-04-30 MED ORDER — ONDANSETRON HCL 4 MG/2ML IJ SOLN
INTRAMUSCULAR | Status: AC
  Administered 2013-04-30: 4 mg
  Filled 2013-04-30: qty 2

## 2013-04-30 MED ORDER — SODIUM CHLORIDE 0.9 % IV BOLUS (SEPSIS)
1000.0000 mL | Freq: Once | INTRAVENOUS | Status: AC
  Administered 2013-04-30: 1000 mL via INTRAVENOUS

## 2013-04-30 MED ORDER — LORAZEPAM 2 MG/ML IJ SOLN
1.0000 mg | Freq: Once | INTRAMUSCULAR | Status: AC
  Administered 2013-04-30: 1 mg via INTRAVENOUS
  Filled 2013-04-30: qty 1

## 2013-04-30 MED ORDER — ONDANSETRON HCL 4 MG/2ML IJ SOLN
INTRAMUSCULAR | Status: AC
  Filled 2013-04-30: qty 2

## 2013-04-30 MED ORDER — PROMETHAZINE HCL 25 MG RE SUPP: 25.0000 mg | Freq: Four times a day (QID) | RECTAL | Status: DC | PRN

## 2013-04-30 MED ORDER — METOCLOPRAMIDE HCL 5 MG/ML IJ SOLN
10.0000 mg | Freq: Once | INTRAMUSCULAR | Status: AC
  Administered 2013-04-30: 10 mg via INTRAVENOUS
  Filled 2013-04-30: qty 2

## 2013-04-30 MED ORDER — ONDANSETRON HCL 4 MG/2ML IJ SOLN
4.0000 mg | Freq: Once | INTRAMUSCULAR | Status: AC
  Administered 2013-04-30: 4 mg via INTRAVENOUS
  Filled 2013-04-30: qty 2

## 2013-04-30 MED ORDER — PROMETHAZINE HCL 25 MG PO TABS: 25.0000 mg | ORAL_TABLET | Freq: Four times a day (QID) | ORAL | Status: DC | PRN

## 2013-04-30 NOTE — ED Notes (Signed)
MD informed pt finished her fluids and is feeling better. MD informed RN to discharge the pt.

## 2013-04-30 NOTE — ED Notes (Addendum)
Pt from home c/o syncope with cp and n/v for 3 days. Pt received 1 nitro,4 zofran per ems

## 2013-04-30 NOTE — ED Notes (Signed)
Dr. Patria Mane back in with patient

## 2013-04-30 NOTE — ED Notes (Signed)
Attempted to discharge pt, pt reports vomiting and requests medication for vomiting.

## 2013-04-30 NOTE — ED Provider Notes (Signed)
CSN: [id number]     Arrival date & time 04/30/13  1525 History     First MD Initiated Contact with Patient 04/30/13 1527     Chief Complaint  Patient presents with  . Loss of Consciousness  . Chest Pain   (Consider location/radiation/quality/duration/timing/severity/associated sxs/prior Treatment) HPI Patient reports long-standing history of recurrent nausea and vomiting for the past 60 years.  She's been on Zofran at home without improvement in her symptoms.  She's had Reglan before in the past.  She reportedly had a gastroenterologist in [facility] but has no Customer service manager.  She's been to [place] area for 4 years.  She reports a few specks of blood in her vomit or the past several days.  No melena or hematochezia.  She reports some lightheadedness with generalized weakness when she stands up.  No abdominal discomfort.  No chest pain or shortness of breath.  No fevers or chills.  EMS gave for Zofran.  The patient also complained of some chest pain and received one nitroglycerin.  She reports her pain in her chest is present when she vomits.  She vomited nonbloody nonbilious material in the emergency department.  Her vomit his been not consistent with coffee-ground emesis.  She does report a prior history of duodenal ulcers in [place].  She has a chronic pain specialist who follows her in [facility] and prescribes her morphine and oxycodone.  She states she does not have a primary care physician.  She states she has not run out of her pain medication.   Past Medical History  Diagnosis Date  . Diabetes mellitus   . Pulmonary embolism   . CHF (congestive heart failure)   . Hypertension   . COPD (chronic obstructive pulmonary disease)   . Pneumonia   . GERD (gastroesophageal reflux disease)   . Hypokalemia   . Congestive heart failure   . Fibromyalgia   . Renal disorder     renal failure   . Cancer     ovarian   Past Surgical History  Procedure  Laterality Date  . Cholecystectomy    . Appendectomy    . Abdominal hysterectomy    . Total hip arthroplasty     No family history on file. History  Substance Use Topics  . Smoking status: Never Smoker   . Smokeless tobacco: Not on file  . Alcohol Use: No   OB History   Grav Para Term Preterm Abortions TAB SAB Ect Mult Living                 Review of Systems  All other systems reviewed and are negative.    Allergies  Penicillins; Ddavp; Naproxen; Polio virus vaccine live oral trivalent; Sweet potato; Zanaflex; and Levaquin  Home Medications   Current Outpatient Rx  Name  Route  Sig  Dispense  Refill  . albuterol (PROVENTIL HFA;VENTOLIN HFA) 108 (90 BASE) MCG/ACT inhaler   Inhalation   Inhale 2 puffs into the lungs every 6 (six) hours as needed. Shortness of breath         . atorvastatin (LIPITOR) 40 MG tablet   Oral   Take 40 mg by mouth daily.         . cyclobenzaprine (FLEXERIL) 10 MG tablet   Oral   Take 10 mg by mouth 3 (three) times daily.         Marland Kitchen diltiazem (CARDIZEM CD) 180 MG 24 hr capsule   Oral   Take 1 capsule (180  mg total) by mouth daily.   30 capsule   0   . EPINEPHrine (EPI-PEN) 0.3 mg/0.3 mL DEVI   Intramuscular   Inject 0.3 mg into the muscle once. Allergic reaction to peanuts         . ferrous gluconate (FERGON) 324 MG tablet   Oral   Take 324 mg by mouth daily after supper.         . fluticasone (FLONASE) 50 MCG/ACT nasal spray   Nasal   Place 2 sprays into the nose daily as needed. For congestion         . gabapentin (NEURONTIN) 800 MG tablet   Oral   Take 800 mg by mouth 3 (three) times daily.         Marland Kitchen levothyroxine (SYNTHROID, LEVOTHROID) 125 MCG tablet   Oral   Take 125 mcg by mouth daily.         . metFORMIN (GLUCOPHAGE) 500 MG tablet   Oral   Take 500 mg by mouth 2 (two) times daily with a meal.         . morphine (MS CONTIN) 15 MG 12 hr tablet   Oral   Take 30 mg by mouth 2 (two) times daily.           . nitroGLYCERIN (NITROSTAT) 0.4 MG SL tablet   Sublingual   Place 0.4 mg under the tongue every 5 (five) minutes as needed. Chest pain         . omeprazole (PRILOSEC) 20 MG capsule   Oral   Take 20 mg by mouth 2 (two) times daily.          . ondansetron (ZOFRAN) 4 MG tablet   Oral   Take 4 mg by mouth every 8 (eight) hours as needed. nausea         . oxyCODONE (OXY IR/ROXICODONE) 5 MG immediate release tablet   Oral   Take 10 mg by mouth every 4 (four) hours as needed. For pain         . potassium chloride SA (K-DUR,KLOR-CON) 20 MEQ tablet   Oral   Take 40 mEq by mouth 2 (two) times daily.         . SUMAtriptan (IMITREX) 100 MG tablet   Oral   Take 100 mg by mouth every 2 (two) hours as needed. migraines         . traZODone (DESYREL) 100 MG tablet   Oral   Take 100 mg by mouth at bedtime.          BP 151/94  Pulse 106  Temp(Src) 98.2 F (36.8 C) (Oral)  Resp 22  SpO2 97% Physical Exam  Nursing note and vitals reviewed. Constitutional: She is oriented to person, place, and time. She appears well-developed and well-nourished. No distress.  HENT:  Head: Normocephalic and atraumatic.  Eyes: EOM are normal.  Neck: Normal range of motion.  Cardiovascular: Normal rate, regular rhythm and normal heart sounds.   Pulmonary/Chest: Effort normal and breath sounds normal.  Abdominal: Soft. She exhibits no distension. There is no tenderness.  Genitourinary:  Rectal exam performed by nursing.  I was informed of this was brown stool without blood.  Hemoccult negative.  Musculoskeletal: Normal range of motion.  Neurological: She is alert and oriented to person, place, and time.  Skin: Skin is warm and dry.  Psychiatric: She has a normal mood and affect. Judgment normal.    ED Course   Procedures (including critical care time)  Date: 04/30/2013  Rate: 108  Rhythm: sinus tachycardia  QRS Axis: normal  Intervals: normal  ST/T Wave abnormalities:  normal  Conduction Disutrbances: none  Narrative Interpretation:   Old EKG Reviewed: No significant changes noted     Labs Reviewed  CBC - Abnormal; Notable for the following:    WBC 10.9 (*)    MCV 74.0 (*)    MCH 24.9 (*)    All other components within normal limits  COMPREHENSIVE METABOLIC PANEL - Abnormal; Notable for the following:    Potassium 3.4 (*)    Glucose, Bld 119 (*)    AST 72 (*)    ALT 73 (*)    Alkaline Phosphatase 211 (*)    All other components within normal limits  TROPONIN I  TYPE AND SCREEN  ABO/RH   Dg Chest Portable 1 View  04/30/2013   *RADIOLOGY REPORT*  Clinical Data: Loss of consciousness, chest pain.  PORTABLE CHEST - 1 VIEW  Comparison: 10/23/2012  Findings: Heart and mediastinal contours are within normal limits. No focal opacities or effusions.  No acute bony abnormality.  IMPRESSION: No active cardiopulmonary disease.   Original Report Authenticated By: Charlett Nose, M.D.   1. Nausea and vomiting     MDM  Ongoing recurrent nausea vomiting.  Vital signs without significant abnormalities the emergency department.  Hemoglobin stable at 12.  Hemoccult negative for blood.  She'll continue her Prilosec at home.  Given her the number for outpatient gastroenterology followup.  She's instructed to develop a relationship with her primary care physician.  She understands return to the ER for new or worsening symptoms.  Her chest pain is noncardiac  Lyanne Co, MD 04/30/13 [phone]

## 2013-04-30 NOTE — ED Notes (Signed)
Pt actively vomiting. Filled half an emesis bag.

## 2013-04-30 NOTE — ED Notes (Addendum)
MD informed pt still actively vomiting, MD informed RN to remove pt from discharge at this time. See new orders.

## 2013-04-30 NOTE — ED Notes (Signed)
Phlebotomy at the bedside  

## 2013-04-30 NOTE — ED Notes (Signed)
Pt states she feels better at this time. Pt denies any vomiting in the past forty minutes.

## 2013-10-09 ENCOUNTER — Other Ambulatory Visit: Payer: Self-pay | Admitting: Internal Medicine

## 2013-10-09 DIAGNOSIS — N6312 Unspecified lump in the right breast, upper inner quadrant: Secondary | ICD-10-CM

## 2013-10-18 ENCOUNTER — Other Ambulatory Visit: Payer: Self-pay

## 2013-10-18 ENCOUNTER — Ambulatory Visit
Admission: RE | Admit: 2013-10-18 | Discharge: 2013-10-18 | Disposition: A | Discharge status: Home / Self Care | Payer: Medicaid Other | Source: Ambulatory Visit | Attending: Internal Medicine | Admitting: Internal Medicine

## 2013-10-18 DIAGNOSIS — N6312 Unspecified lump in the right breast, upper inner quadrant: Secondary | ICD-10-CM

## 2013-10-19 ENCOUNTER — Encounter: Payer: Self-pay | Admitting: Advanced Practice Midwife

## 2013-10-23 ENCOUNTER — Other Ambulatory Visit: Payer: Self-pay | Admitting: Internal Medicine

## 2013-10-23 DIAGNOSIS — N6312 Unspecified lump in the right breast, upper inner quadrant: Secondary | ICD-10-CM

## 2013-10-24 ENCOUNTER — Ambulatory Visit
Admission: RE | Admit: 2013-10-24 | Discharge: 2013-10-24 | Disposition: A | Discharge status: Home / Self Care | Payer: Medicaid Other | Source: Ambulatory Visit | Attending: Internal Medicine | Admitting: Internal Medicine

## 2013-10-24 DIAGNOSIS — N6312 Unspecified lump in the right breast, upper inner quadrant: Secondary | ICD-10-CM

## 2013-10-26 ENCOUNTER — Other Ambulatory Visit: Payer: Self-pay | Admitting: Internal Medicine

## 2013-10-26 DIAGNOSIS — N6312 Unspecified lump in the right breast, upper inner quadrant: Secondary | ICD-10-CM

## 2013-11-01 ENCOUNTER — Inpatient Hospital Stay: Admission: RE | Admit: 2013-11-01 | Payer: Self-pay | Source: Ambulatory Visit

## 2013-11-02 ENCOUNTER — Ambulatory Visit: Payer: Medicaid Other | Admitting: Advanced Practice Midwife

## 2013-11-06 ENCOUNTER — Ambulatory Visit
Admission: RE | Admit: 2013-11-06 | Discharge: 2013-11-06 | Disposition: A | Discharge status: Home / Self Care | Payer: Self-pay | Source: Ambulatory Visit | Attending: Internal Medicine | Admitting: Internal Medicine

## 2013-11-06 ENCOUNTER — Ambulatory Visit
Admission: RE | Admit: 2013-11-06 | Discharge: 2013-11-06 | Disposition: A | Discharge status: Home / Self Care | Payer: Medicaid Other | Source: Ambulatory Visit | Attending: Internal Medicine | Admitting: Internal Medicine

## 2013-11-06 ENCOUNTER — Other Ambulatory Visit: Payer: Self-pay | Admitting: Internal Medicine

## 2013-11-06 DIAGNOSIS — N6312 Unspecified lump in the right breast, upper inner quadrant: Secondary | ICD-10-CM

## 2013-12-21 ENCOUNTER — Ambulatory Visit (INDEPENDENT_AMBULATORY_CARE_PROVIDER_SITE_OTHER): Payer: Medicaid Other | Admitting: Advanced Practice Midwife

## 2013-12-21 ENCOUNTER — Encounter: Payer: Self-pay | Admitting: Advanced Practice Midwife

## 2013-12-21 VITALS — BP 140/99 | HR 120 | Temp 98.8°F | Ht 62.0 in | Wt 221.0 lb

## 2013-12-21 DIAGNOSIS — Z8543 Personal history of malignant neoplasm of ovary: Secondary | ICD-10-CM

## 2013-12-21 DIAGNOSIS — Z0000 Encounter for general adult medical examination without abnormal findings: Secondary | ICD-10-CM
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
## 2013-12-24 ENCOUNTER — Encounter: Payer: Self-pay | Admitting: Advanced Practice Midwife

## 2014-01-15 ENCOUNTER — Telehealth: Payer: Self-pay | Admitting: *Deleted

## 2014-01-15 NOTE — Telephone Encounter (Signed)
Call to [facility] gave Joaquim Lai appt for pt 5/20  At 0945. Joaquim Lai to call [facility] care in [place] for records.

## 2014-01-24 ENCOUNTER — Encounter: Payer: Self-pay | Admitting: Obstetrics & Gynecology

## 2014-02-13 ENCOUNTER — Ambulatory Visit: Payer: Medicaid Other | Attending: Gynecology | Admitting: Gynecology

## 2014-02-19 ENCOUNTER — Telehealth: Payer: Self-pay | Admitting: *Deleted

## 2014-02-19 NOTE — Telephone Encounter (Signed)
Called Barb at [facility] to advise pt no show on 5/20 for new pt referral appt.

## 2014-03-13 ENCOUNTER — Telehealth: Payer: Self-pay | Admitting: *Deleted

## 2014-03-13 ENCOUNTER — Ambulatory Visit: Payer: Medicaid Other | Attending: Gynecology | Admitting: Gynecology

## 2014-03-13 NOTE — Telephone Encounter (Signed)
No show

## 2014-03-15 NOTE — Progress Notes (Signed)
Patient no showed for appt at cancer center x2, they will no longer schedule her.  Amy Roni Bread CNM

## 2014-05-15 ENCOUNTER — Other Ambulatory Visit: Payer: Self-pay | Admitting: Gastroenterology

## 2014-05-15 DIAGNOSIS — R1084 Generalized abdominal pain: Secondary | ICD-10-CM

## 2014-05-23 ENCOUNTER — Inpatient Hospital Stay: Admission: RE | Admit: 2014-05-23 | Payer: Self-pay | Source: Ambulatory Visit

## 2014-05-28 ENCOUNTER — Other Ambulatory Visit: Payer: Self-pay

## 2014-07-29 ENCOUNTER — Encounter: Payer: Self-pay | Admitting: Advanced Practice Midwife

## 2014-09-23 ENCOUNTER — Encounter: Payer: Self-pay | Admitting: *Deleted

## 2014-09-24 ENCOUNTER — Encounter: Payer: Self-pay | Admitting: Obstetrics & Gynecology

## 2014-12-12 ENCOUNTER — Other Ambulatory Visit: Payer: Self-pay | Admitting: Internal Medicine

## 2014-12-12 ENCOUNTER — Ambulatory Visit
Admission: RE | Admit: 2014-12-12 | Discharge: 2014-12-12 | Disposition: A | Discharge status: Home / Self Care | Payer: Medicaid Other | Source: Ambulatory Visit | Attending: Internal Medicine | Admitting: Internal Medicine

## 2014-12-12 DIAGNOSIS — M25552 Pain in left hip: Secondary | ICD-10-CM

## 2015-01-13 ENCOUNTER — Emergency Department (HOSPITAL_COMMUNITY): Payer: Medicaid Other

## 2015-01-13 ENCOUNTER — Encounter (HOSPITAL_COMMUNITY): Payer: Self-pay | Admitting: Emergency Medicine

## 2015-01-13 ENCOUNTER — Emergency Department (HOSPITAL_COMMUNITY)
Admission: EM | Admit: 2015-01-13 | Discharge: 2015-01-13 | Disposition: A | Discharge status: Home / Self Care | Payer: Medicaid Other | Attending: Emergency Medicine | Admitting: Emergency Medicine

## 2015-01-13 DIAGNOSIS — J441 Chronic obstructive pulmonary disease with (acute) exacerbation: Secondary | ICD-10-CM | POA: Insufficient documentation

## 2015-01-13 DIAGNOSIS — Z8701 Personal history of pneumonia (recurrent): Secondary | ICD-10-CM | POA: Insufficient documentation

## 2015-01-13 DIAGNOSIS — E119 Type 2 diabetes mellitus without complications: Secondary | ICD-10-CM | POA: Insufficient documentation

## 2015-01-13 DIAGNOSIS — M1990 Unspecified osteoarthritis, unspecified site: Secondary | ICD-10-CM | POA: Insufficient documentation

## 2015-01-13 DIAGNOSIS — R5383 Other fatigue: Secondary | ICD-10-CM | POA: Diagnosis not present

## 2015-01-13 DIAGNOSIS — Z8543 Personal history of malignant neoplasm of ovary: Secondary | ICD-10-CM | POA: Diagnosis not present

## 2015-01-13 DIAGNOSIS — Z79899 Other long term (current) drug therapy: Secondary | ICD-10-CM | POA: Diagnosis not present

## 2015-01-13 DIAGNOSIS — Z880 Allergy status to penicillin: Secondary | ICD-10-CM | POA: Insufficient documentation

## 2015-01-13 DIAGNOSIS — K219 Gastro-esophageal reflux disease without esophagitis: Secondary | ICD-10-CM | POA: Insufficient documentation

## 2015-01-13 DIAGNOSIS — R0789 Other chest pain: Secondary | ICD-10-CM | POA: Diagnosis not present

## 2015-01-13 DIAGNOSIS — I10 Essential (primary) hypertension: Secondary | ICD-10-CM | POA: Diagnosis not present

## 2015-01-13 DIAGNOSIS — E876 Hypokalemia: Secondary | ICD-10-CM | POA: Diagnosis not present

## 2015-01-13 DIAGNOSIS — Z86711 Personal history of pulmonary embolism: Secondary | ICD-10-CM | POA: Insufficient documentation

## 2015-01-13 DIAGNOSIS — R079 Chest pain, unspecified: Secondary | ICD-10-CM

## 2015-01-13 DIAGNOSIS — I509 Heart failure, unspecified: Secondary | ICD-10-CM | POA: Insufficient documentation

## 2015-01-13 DIAGNOSIS — Z87448 Personal history of other diseases of urinary system: Secondary | ICD-10-CM | POA: Insufficient documentation

## 2015-01-13 LAB — CBC
HCT: 39.9 % (ref 36.0–46.0)
HEMOGLOBIN: 12.6 g/dL (ref 12.0–15.0)
MCH: 22.8 pg — AB (ref 26.0–34.0)
MCHC: 31.6 g/dL (ref 30.0–36.0)
MCV: 72.2 fL — AB (ref 78.0–100.0)
Platelets: 282 10*3/uL (ref 150–400)
RBC: 5.53 MIL/uL — ABNORMAL HIGH (ref 3.87–5.11)
RDW: 16.1 % — ABNORMAL HIGH (ref 11.5–15.5)
WBC: 10.2 10*3/uL (ref 4.0–10.5)

## 2015-01-13 LAB — I-STAT BETA HCG BLOOD, ED (MC, WL, AP ONLY): I-stat hCG, quantitative: <5 m[IU]/mL (ref <5)

## 2015-01-13 LAB — BRAIN NATRIURETIC PEPTIDE: B Natriuretic Peptide: 14.6 pg/mL (ref 0.0–100.0)

## 2015-01-13 LAB — BASIC METABOLIC PANEL
Anion gap: 13 (ref 5–15)
BUN: 13 mg/dL (ref 6–23)
CHLORIDE: 108 mmol/L (ref 96–112)
CO2: 19 mmol/L (ref 19–32)
Calcium: 9.7 mg/dL (ref 8.4–10.5)
Creatinine, Ser: 1.2 mg/dL — ABNORMAL HIGH (ref 0.50–1.10)
GFR calc Af Amer: 68 mL/min — ABNORMAL LOW (ref >90)
GFR, EST NON AFRICAN AMERICAN: 58 mL/min — AB (ref >90)
GLUCOSE: 82 mg/dL (ref 70–99)
POTASSIUM: 3.8 mmol/L (ref 3.5–5.1)
SODIUM: 140 mmol/L (ref 135–145)

## 2015-01-13 LAB — I-STAT TROPONIN, ED
TROPONIN I, POC: 0 ng/mL (ref 0.00–0.08)
TROPONIN I, POC: 0 ng/mL (ref 0.00–0.08)

## 2015-01-13 MED ORDER — TRAMADOL HCL 50 MG PO TABS: 50.0000 mg | ORAL_TABLET | Freq: Four times a day (QID) | ORAL | Status: DC | PRN

## 2015-01-13 MED ORDER — OXYCODONE-ACETAMINOPHEN 5-325 MG PO TABS
1.0000 | ORAL_TABLET | Freq: Once | ORAL | Status: AC
  Administered 2015-01-13: 1 via ORAL
  Filled 2015-01-13: qty 1

## 2015-01-13 MED ORDER — IOHEXOL 350 MG/ML SOLN
100.0000 mL | Freq: Once | INTRAVENOUS | Status: AC | PRN
  Administered 2015-01-13: 100 mL via INTRAVENOUS

## 2015-01-13 MED ORDER — SODIUM CHLORIDE 0.9 % IV BOLUS (SEPSIS)
1000.0000 mL | Freq: Once | INTRAVENOUS | Status: AC
  Administered 2015-01-13: 1000 mL via INTRAVENOUS

## 2015-01-13 MED ORDER — KETOROLAC TROMETHAMINE 30 MG/ML IJ SOLN
30.0000 mg | Freq: Once | INTRAMUSCULAR | Status: AC
  Administered 2015-01-13: 30 mg via INTRAVENOUS
  Filled 2015-01-13: qty 1

## 2015-01-13 MED ORDER — LORAZEPAM 1 MG PO TABS
1.0000 mg | ORAL_TABLET | Freq: Once | ORAL | Status: AC
  Administered 2015-01-13: 1 mg via ORAL
  Filled 2015-01-13: qty 1

## 2015-01-13 NOTE — ED Notes (Signed)
Pt c/o left sided chest pain onset today while at work. Pain radiates across entire chest. 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

## 2015-01-13 NOTE — ED Notes (Signed)
Pt being transported to CT angio

## 2015-01-13 NOTE — ED Notes (Signed)
Phlebotomy at bedside.

## 2015-01-13 NOTE — ED Provider Notes (Signed)
EKG Interpretation  Date/Time:  Monday January 13 2015 10:40:56 EDT Ventricular Rate:  133 PR Interval:  142 QRS Duration: 76 QT Interval:  300 QTC Calculation: 446 R Axis:   87 Text Interpretation:  Sinus tachycardia Cannot rule out Anterior infarct , age undetermined Abnormal ECG Since last tracing rate faster Confirmed by Sabra Heck  MD, Kamalei Roeder ([id number]) on 01/13/2015 5:22:34 PM        Noemi Chapel, MD 01/13/15 [phone]

## 2015-01-13 NOTE — Discharge Instructions (Signed)
Tramadol for pain. Tylenol for additional pain relief. Follow up with your doctor if symptoms continue. Today's labs and ct did not show any abnormal findings which would explain your symptoms.    Chest Pain (Nonspecific) It is often hard to give a specific diagnosis for the cause of chest pain. There is always a chance that your pain could be related to something serious, such as a heart attack or a blood clot in the lungs. You need to follow up with your health care provider for further evaluation. CAUSES   Heartburn.  Pneumonia or bronchitis.  Anxiety or stress.  Inflammation around your heart (pericarditis) or lung (pleuritis or pleurisy).  A blood clot in the lung.  A collapsed lung (pneumothorax). It can develop suddenly on its own (spontaneous pneumothorax) or from trauma to the chest.  Shingles infection (herpes zoster virus). The chest wall is composed of bones, muscles, and cartilage. Any of these can be the source of the pain.  The bones can be bruised by injury.  The muscles or cartilage can be strained by coughing or overwork.  The cartilage can be affected by inflammation and become sore (costochondritis). DIAGNOSIS  Lab tests or other studies may be needed to find the cause of your pain. Your health care provider may have you take a test called an ambulatory electrocardiogram (ECG). An ECG records your heartbeat patterns over a 24-hour period. You may also have other tests, such as:  Transthoracic echocardiogram (TTE). During echocardiography, sound waves are used to evaluate how blood flows through your heart.  Transesophageal echocardiogram (TEE).  Cardiac monitoring. This allows your health care provider to monitor your heart rate and rhythm in real time.  Holter monitor. This is a portable device that records your heartbeat and can help diagnose heart arrhythmias. It allows your health care provider to track your heart activity for several days, if  needed.  Stress tests by exercise or by giving medicine that makes the heart beat faster. TREATMENT   Treatment depends on what may be causing your chest pain. Treatment may include:  Acid blockers for heartburn.  Anti-inflammatory medicine.  Pain medicine for inflammatory conditions.  Antibiotics if an infection is present.  You may be advised to change lifestyle habits. This includes stopping smoking and avoiding alcohol, caffeine, and chocolate.  You may be advised to keep your head raised (elevated) when sleeping. This reduces the chance of acid going backward from your stomach into your esophagus. Most of the time, nonspecific chest pain will improve within 2-3 days with rest and mild pain medicine.  HOME CARE INSTRUCTIONS   If antibiotics were prescribed, take them as directed. Finish them even if you start to feel better.  For the next few days, avoid physical activities that bring on chest pain. Continue physical activities as directed.  Do not use any tobacco products, including cigarettes, chewing tobacco, or electronic cigarettes.  Avoid drinking alcohol.  Only take medicine as directed by your health care provider.  Follow your health care provider's suggestions for further testing if your chest pain does not go away.  Keep any follow-up appointments you made. If you do not go to an appointment, you could develop lasting (chronic) problems with pain. If there is any problem keeping an appointment, call to reschedule. SEEK MEDICAL CARE IF:   Your chest pain does not go away, even after treatment.  You have a rash with blisters on your chest.  You have a fever. SEEK IMMEDIATE MEDICAL CARE IF:  You have increased chest pain or pain that spreads to your arm, neck, jaw, back, or abdomen.  You have shortness of breath.  You have an increasing cough, or you cough up blood.  You have severe back or abdominal pain.  You feel nauseous or vomit.  You have severe  weakness.  You faint.  You have chills. This is an emergency. Do not wait to see if the pain will go away. Get medical help at once. Call your local emergency services (911 in [place] Do not drive yourself to the hospital. MAKE SURE YOU:   Understand these instructions.  Will watch your condition.  Will get help right away if you are not doing well or get worse. Document Released: 06/23/2005 Document Revised: 09/18/2013 Document Reviewed: 04/18/2008 [facility] Patient Information 2015 [place], [place]. This information is not intended to replace advice given to you by your health care provider. Make sure you discuss any questions you have with your health care provider.
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
## 2015-02-20 ENCOUNTER — Other Ambulatory Visit: Payer: Self-pay

## 2016-01-29 ENCOUNTER — Emergency Department (HOSPITAL_COMMUNITY)
Admission: EM | Admit: 2016-01-29 | Discharge: 2016-01-30 | Disposition: A | Discharge status: Home / Self Care | Payer: Medicaid Other | Attending: Emergency Medicine | Admitting: Emergency Medicine

## 2016-01-29 ENCOUNTER — Emergency Department (HOSPITAL_COMMUNITY): Payer: Medicaid Other

## 2016-01-29 ENCOUNTER — Encounter (HOSPITAL_COMMUNITY): Payer: Self-pay | Admitting: Emergency Medicine

## 2016-01-29 DIAGNOSIS — Z9049 Acquired absence of other specified parts of digestive tract: Secondary | ICD-10-CM | POA: Diagnosis not present

## 2016-01-29 DIAGNOSIS — J449 Chronic obstructive pulmonary disease, unspecified: Secondary | ICD-10-CM | POA: Insufficient documentation

## 2016-01-29 DIAGNOSIS — F1410 Cocaine abuse, uncomplicated: Secondary | ICD-10-CM

## 2016-01-29 DIAGNOSIS — Z86711 Personal history of pulmonary embolism: Secondary | ICD-10-CM | POA: Diagnosis not present

## 2016-01-29 DIAGNOSIS — F1110 Opioid abuse, uncomplicated: Secondary | ICD-10-CM | POA: Insufficient documentation

## 2016-01-29 DIAGNOSIS — R079 Chest pain, unspecified: Secondary | ICD-10-CM | POA: Diagnosis not present

## 2016-01-29 DIAGNOSIS — I10 Essential (primary) hypertension: Secondary | ICD-10-CM | POA: Diagnosis not present

## 2016-01-29 DIAGNOSIS — E119 Type 2 diabetes mellitus without complications: Secondary | ICD-10-CM | POA: Diagnosis not present

## 2016-01-29 DIAGNOSIS — I509 Heart failure, unspecified: Secondary | ICD-10-CM | POA: Diagnosis not present

## 2016-01-29 DIAGNOSIS — K449 Diaphragmatic hernia without obstruction or gangrene: Secondary | ICD-10-CM | POA: Diagnosis not present

## 2016-01-29 DIAGNOSIS — Z8543 Personal history of malignant neoplasm of ovary: Secondary | ICD-10-CM | POA: Insufficient documentation

## 2016-01-29 DIAGNOSIS — Z79899 Other long term (current) drug therapy: Secondary | ICD-10-CM | POA: Insufficient documentation

## 2016-01-29 DIAGNOSIS — Z8701 Personal history of pneumonia (recurrent): Secondary | ICD-10-CM | POA: Diagnosis not present

## 2016-01-29 DIAGNOSIS — Z880 Allergy status to penicillin: Secondary | ICD-10-CM | POA: Insufficient documentation

## 2016-01-29 DIAGNOSIS — M1990 Unspecified osteoarthritis, unspecified site: Secondary | ICD-10-CM | POA: Diagnosis not present

## 2016-01-29 DIAGNOSIS — E876 Hypokalemia: Secondary | ICD-10-CM | POA: Insufficient documentation

## 2016-01-29 DIAGNOSIS — M797 Fibromyalgia: Secondary | ICD-10-CM | POA: Insufficient documentation

## 2016-01-29 DIAGNOSIS — K219 Gastro-esophageal reflux disease without esophagitis: Secondary | ICD-10-CM | POA: Insufficient documentation

## 2016-01-29 DIAGNOSIS — Z87448 Personal history of other diseases of urinary system: Secondary | ICD-10-CM | POA: Insufficient documentation

## 2016-01-29 LAB — COMPREHENSIVE METABOLIC PANEL
ALT: 99 U/L — ABNORMAL HIGH (ref 14–54)
AST: 207 U/L — AB (ref 15–41)
Albumin: 3.4 g/dL — ABNORMAL LOW (ref 3.5–5.0)
Alkaline Phosphatase: 120 U/L (ref 38–126)
Anion gap: 14 (ref 5–15)
BILIRUBIN TOTAL: 0.6 mg/dL (ref 0.3–1.2)
BUN: 6 mg/dL (ref 6–20)
CHLORIDE: 98 mmol/L — AB (ref 101–111)
CO2: 22 mmol/L (ref 22–32)
Calcium: 8.6 mg/dL — ABNORMAL LOW (ref 8.9–10.3)
Creatinine, Ser: 0.91 mg/dL (ref 0.44–1.00)
Glucose, Bld: 89 mg/dL (ref 65–99)
POTASSIUM: 2.9 mmol/L — AB (ref 3.5–5.1)
Sodium: 134 mmol/L — ABNORMAL LOW (ref 135–145)
Total Protein: 6.3 g/dL — ABNORMAL LOW (ref 6.5–8.1)

## 2016-01-29 LAB — CBC WITH DIFFERENTIAL/PLATELET
Basophils Absolute: 0 10*3/uL (ref 0.0–0.1)
Basophils Relative: 0 %
EOS ABS: 0 10*3/uL (ref 0.0–0.7)
Eosinophils Relative: 0 %
HCT: 34.8 % — ABNORMAL LOW (ref 36.0–46.0)
Hemoglobin: 10.9 g/dL — ABNORMAL LOW (ref 12.0–15.0)
Lymphocytes Relative: 12 %
Lymphs Abs: 0.8 10*3/uL (ref 0.7–4.0)
MCH: 23.7 pg — ABNORMAL LOW (ref 26.0–34.0)
MCHC: 31.3 g/dL (ref 30.0–36.0)
MCV: 75.7 fL — ABNORMAL LOW (ref 78.0–100.0)
MONOS PCT: 6 %
Monocytes Absolute: 0.4 10*3/uL (ref 0.1–1.0)
NEUTROS PCT: 82 %
Neutro Abs: 5.3 10*3/uL (ref 1.7–7.7)
Platelets: 153 10*3/uL (ref 150–400)
RBC: 4.6 MIL/uL (ref 3.87–5.11)
RDW: 14.4 % (ref 11.5–15.5)
WBC: 6.5 10*3/uL (ref 4.0–10.5)

## 2016-01-29 LAB — RAPID URINE DRUG SCREEN, HOSP PERFORMED
Amphetamines: NOT DETECTED
Barbiturates: NOT DETECTED
Benzodiazepines: NOT DETECTED
COCAINE: POSITIVE — AB
Opiates: POSITIVE — AB
TETRAHYDROCANNABINOL: NOT DETECTED

## 2016-01-29 LAB — TROPONIN I: Troponin I: 0.07 ng/mL — ABNORMAL HIGH (ref <0.031)

## 2016-01-29 LAB — URINALYSIS, ROUTINE W REFLEX MICROSCOPIC
Bilirubin Urine: NEGATIVE
GLUCOSE, UA: NEGATIVE mg/dL
KETONES UR: NEGATIVE mg/dL
Leukocytes, UA: NEGATIVE
NITRITE: NEGATIVE
Protein, ur: NEGATIVE mg/dL
Specific Gravity, Urine: 1.006 (ref 1.005–1.030)
pH: 7.5 (ref 5.0–8.0)

## 2016-01-29 LAB — D-DIMER, QUANTITATIVE (NOT AT ARMC): D DIMER QUANT: 0.98 ug{FEU}/mL — AB (ref 0.00–0.50)

## 2016-01-29 LAB — URINE MICROSCOPIC-ADD ON

## 2016-01-29 LAB — PROTIME-INR
INR: 1.2 (ref 0.00–1.49)
PROTHROMBIN TIME: 15.4 s — AB (ref 11.6–15.2)

## 2016-01-29 MED ORDER — GI COCKTAIL ~~LOC~~
30.0000 mL | Freq: Once | ORAL | Status: AC
Start: 2016-01-30 — End: 2016-01-30
  Administered 2016-01-30: 30 mL via ORAL
  Filled 2016-01-29: qty 30

## 2016-01-29 MED ORDER — IOPAMIDOL (ISOVUE-370) INJECTION 76%
INTRAVENOUS | Status: AC
  Administered 2016-01-29: 100 mL
  Filled 2016-01-29: qty 100

## 2016-01-29 MED ORDER — KETOROLAC TROMETHAMINE 30 MG/ML IJ SOLN
30.0000 mg | Freq: Once | INTRAMUSCULAR | Status: AC
  Administered 2016-01-29: 30 mg via INTRAVENOUS
  Filled 2016-01-29: qty 1

## 2016-01-29 MED ORDER — FENTANYL CITRATE (PF) 100 MCG/2ML IJ SOLN
50.0000 ug | Freq: Once | INTRAMUSCULAR | Status: AC
  Administered 2016-01-29: 50 ug via INTRAVENOUS
  Filled 2016-01-29: qty 2

## 2016-01-29 MED ORDER — FENTANYL CITRATE (PF) 100 MCG/2ML IJ SOLN
50.0000 ug | Freq: Once | INTRAMUSCULAR | Status: AC
Start: 2016-01-29 — End: 2016-01-29
  Administered 2016-01-29: 50 ug via INTRAVENOUS
  Filled 2016-01-29: qty 2

## 2016-01-29 NOTE — ED Notes (Addendum)
Pt arrives via [facility] for c/o generalized chest pain that she reports for the past 2-3 days. Ems reports 324mg  asa, 4 nitro given and 6mg  morphine. Pt reports fever this a.m., reports taking antipyretics. Pt sinus tach at 117 on ems 12 lead. Pt also reports bilateral leg pain.

## 2016-01-29 NOTE — ED Provider Notes (Signed)
CSN: [id number]     Arrival date & time 01/29/16  1717 History   First MD Initiated Contact with Patient 01/29/16 1729     Chief Complaint  Patient presents with  . Chest Pain    The history is provided by the patient and medical records.   Patient presents to the emergency department with complaints of generalized chest pain over the past 2-3 days.  She reports low-grade fever today.  She reports no productive cough.  She denies abdominal pain.  No dysuria or urinary frequency.  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  She reports she is currently not on anticoagulation.  She also has a history of fibromyalgia and chronic pain for which she is on Roxicodone.  She's been compliant with her oxycodone and states that today this did not help her discomfort or pain.  She denies upper back pain.  No radiation of her chest pain towards her jaw or shoulders.  Her pain is been constant.  No prior history of coronary artery disease.  She does carry diagnosis however of congestive heart failure.  Her pain is constant and nothing seems to worsen or improve her pain.  Her pain is moderate to severe in severity at this time   Past Medical History  Diagnosis Date  . Diabetes mellitus   . Pulmonary embolism ([place])   . CHF (congestive heart failure) ([place])   . Hypertension   . COPD (chronic obstructive pulmonary disease) ([place])   . Pneumonia   . GERD (gastroesophageal reflux disease)   . Hypokalemia   . Congestive heart failure ([place])   . Fibromyalgia   . Renal disorder     renal failure   . Cancer ([place])     ovarian  . Liver failure ([place])   . Rhabdomyolysis   . Arthritis    Past Surgical History  Procedure Laterality Date  . Cholecystectomy    . Appendectomy    . Abdominal hysterectomy    . Total hip arthroplasty    . Femur and knee replacement    . Kidney stone surgery      multiple   Family History  Problem Relation Age of Onset  . Hypertension Mother   . Kidney disease  Mother   . Heart disease Father   . Diabetes Father   . Hyperlipidemia Father   . Kidney disease Father   . Hypertension Father    Social History  Substance Use Topics  . Smoking status: Never Smoker   . Smokeless tobacco: None  . Alcohol Use: No   OB History    Gravida Para Term Preterm AB TAB SAB Ectopic Multiple Living   3 3  3     1 2      Review of Systems  All other systems reviewed and are negative.     Allergies  Naproxen; Peanut-containing drug products; Penicillins; Pertussis vaccines; Zanaflex; Ddavp; Polio virus vaccine live oral trivalent; Levaquin; and Sweet potato  Home Medications   Prior to Admission medications   Medication Sig Start Date End Date Taking? Authorizing Provider  albuterol (PROVENTIL HFA;VENTOLIN HFA) 108 (90 BASE) MCG/ACT inhaler Inhale 2 puffs into the lungs every 6 (six) hours as needed. Shortness of breath   Yes Historical Provider, MD  albuterol (PROVENTIL) (2.5 MG/3ML) 0.083% nebulizer solution Take 2.5 mg by nebulization every 6 (six) hours as needed for wheezing or shortness of breath.   Yes Historical Provider, MD  clonazePAM (KLONOPIN) 1 MG tablet Take  1 mg by mouth 3 (three) times daily as needed for anxiety.   Yes Historical Provider, MD  cyclobenzaprine (FLEXERIL) 10 MG tablet Take 10 mg by mouth 3 (three) times daily.   Yes Historical Provider, MD  diltiazem (CARDIZEM CD) 360 MG 24 hr capsule Take 360 mg by mouth daily.   Yes Historical Provider, MD  EPINEPHrine (EPI-PEN) 0.3 mg/0.3 mL DEVI Inject 0.3 mg into the muscle once. Allergic reaction to peanuts   Yes Historical Provider, MD  ferrous gluconate (FERGON) 324 MG tablet Take 324 mg by mouth daily after supper.   Yes Historical Provider, MD  fluticasone (FLONASE) 50 MCG/ACT nasal spray Place 2 sprays into the nose daily as needed. For congestion   Yes Historical Provider, MD  gabapentin (NEURONTIN) 800 MG tablet Take 800 mg by mouth 3 (three) times daily.   Yes Historical  Provider, MD  ibuprofen (ADVIL,MOTRIN) 200 MG tablet Take 400 mg by mouth every 6 (six) hours as needed.   Yes Historical Provider, MD  levothyroxine (SYNTHROID, LEVOTHROID) 125 MCG tablet Take 125 mcg by mouth daily.   Yes Historical Provider, MD  MAGNESIUM PO Take 1 tablet by mouth 2 (two) times daily.   Yes Historical Provider, MD  metoprolol succinate (TOPROL-XL) 50 MG 24 hr tablet Take 50 mg by mouth daily. Take with or immediately following a meal.   Yes Historical Provider, MD  nitroGLYCERIN (NITROSTAT) 0.4 MG SL tablet Place 0.4 mg under the tongue every 5 (five) minutes as needed. Chest pain   Yes Historical Provider, MD  omeprazole (PRILOSEC) 20 MG capsule Take 20 mg by mouth 2 (two) times daily.    Yes Historical Provider, MD  ondansetron (ZOFRAN) 8 MG tablet Take 8 mg by mouth every 8 (eight) hours as needed for nausea.   Yes Historical Provider, MD  oxyCODONE (ROXICODONE) 15 MG immediate release tablet Take 1 tablet by mouth 4 (four) times daily. 01/22/16  Yes Historical Provider, MD  potassium chloride SA (K-DUR,KLOR-CON) 20 MEQ tablet Take 40 mEq by mouth 4 (four) times daily.    Yes Historical Provider, MD  promethazine (PHENERGAN) 25 MG tablet Take 1 tablet (25 mg total) by mouth every 6 (six) hours as needed for nausea. 04/30/13  Yes Jola Schmidt, MD  risperiDONE (RISPERDAL) 0.25 MG tablet Take 0.25 mg by mouth at bedtime.   Yes Historical Provider, MD  SUMAtriptan (IMITREX) 100 MG tablet Take 100 mg by mouth every 2 (two) hours as needed. migraines   Yes Historical Provider, MD  traMADol (ULTRAM) 50 MG tablet Take 1 tablet (50 mg total) by mouth every 6 (six) hours as needed. 01/13/15  Yes Tatyana Kirichenko, PA-C  traZODone (DESYREL) 100 MG tablet Take 100 mg by mouth at bedtime.   Yes Historical Provider, MD  promethazine (PHENERGAN) 25 MG suppository Place 1 suppository (25 mg total) rectally every 6 (six) hours as needed for nausea. Patient not taking: Reported on 01/13/2015 04/30/13    Jola Schmidt, MD   BP 116/75 mmHg  Pulse 91  Temp(Src) 100.9 F (38.3 C) (Oral)  Resp 17  SpO2 100% Physical Exam  Constitutional: She is oriented to person, place, and time. She appears well-developed and well-nourished. No distress.  HENT:  Head: Normocephalic and atraumatic.  Eyes: EOM are normal.  Neck: Normal range of motion.  Cardiovascular: Normal rate, regular rhythm and normal heart sounds.   Pulmonary/Chest: Effort normal and breath sounds normal.  Abdominal: Soft. She exhibits no distension. There is no tenderness.  Musculoskeletal:  Normal range of motion.  Neurological: She is alert and oriented to person, place, and time.  Skin: Skin is warm and dry.  Psychiatric: She has a normal mood and affect. Judgment normal.  Nursing note and vitals reviewed.   ED Course  Procedures (including critical care time) Labs Review Labs Reviewed  CBC WITH DIFFERENTIAL/PLATELET - Abnormal; Notable for the following:    Hemoglobin 10.9 (*)    HCT 34.8 (*)    MCV 75.7 (*)    MCH 23.7 (*)    All other components within normal limits  PROTIME-INR - Abnormal; Notable for the following:    Prothrombin Time 15.4 (*)    All other components within normal limits  COMPREHENSIVE METABOLIC PANEL - Abnormal; Notable for the following:    Sodium 134 (*)    Potassium 2.9 (*)    Chloride 98 (*)    Calcium 8.6 (*)    Total Protein 6.3 (*)    Albumin 3.4 (*)    AST 207 (*)    ALT 99 (*)    All other components within normal limits  TROPONIN I - Abnormal; Notable for the following:    Troponin I 0.07 (*)    All other components within normal limits  URINE RAPID DRUG SCREEN, HOSP PERFORMED - Abnormal; Notable for the following:    Opiates POSITIVE (*)    Cocaine POSITIVE (*)    All other components within normal limits  URINALYSIS, ROUTINE W REFLEX MICROSCOPIC (NOT AT [facility]) - Abnormal; Notable for the following:    Hgb urine dipstick SMALL (*)    All other components within normal  limits  D-DIMER, QUANTITATIVE (NOT AT [facility]) - Abnormal; Notable for the following:    D-Dimer, Quant 0.98 (*)    All other components within normal limits  URINE MICROSCOPIC-ADD ON - Abnormal; Notable for the following:    Squamous Epithelial / LPF 0-5 (*)    Bacteria, UA RARE (*)    All other components within normal limits  I-STAT TROPOININ, ED    Imaging Review Dg Chest 2 View  01/29/2016  CLINICAL DATA:  Central chest pain for 2 days with shortness of breath EXAM: CHEST  2 VIEW COMPARISON:  01/13/2015 FINDINGS: Heart size and vascular pattern normal. Minimal residual scarring left base. Lungs otherwise clear. No pleural effusion. IMPRESSION: No active cardiopulmonary disease. Electronically Signed   By: Skipper Cliche M.D.   On: 01/29/2016 18:55   Ct Angio Chest Pe W/cm &/or Wo Cm  01/29/2016  CLINICAL DATA:  Chest pain. EXAM: CT ANGIOGRAPHY CHEST WITH CONTRAST TECHNIQUE: Multidetector CT imaging of the chest was performed using the standard protocol during bolus administration of intravenous contrast. Multiplanar CT image reconstructions and MIPs were obtained to evaluate the vascular anatomy. CONTRAST:  100 mL Isovue 370 intravenous COMPARISON:  01/13/2015 FINDINGS: Cardiovascular: There is good opacification of the pulmonary arteries. There is no pulmonary embolism. The thoracic aorta is normal in caliber and intact. Lungs: Mild linear scarring in the left base. The lungs are otherwise clear. Central airways: Normal Effusions: None Lymphadenopathy: None Esophagus: Unremarkable Upper abdomen: Hiatal hernia.  Cholecystectomy. Musculoskeletal: No significant abnormality. Review of the MIP images confirms the above findings. IMPRESSION: Negative for acute pulmonary embolism.  Moderate hiatal hernia. Electronically Signed   By: Andreas Newport M.D.   On: 01/29/2016 23:31   I have personally reviewed and evaluated these images and lab results as part of my medical decision-making.   EKG  Interpretation   Date/Time:  Thursday Jan 29 2016 17:23:53 EDT Ventricular Rate:  122 PR Interval:  132 QRS Duration: 84 QT Interval:  353 QTC Calculation: 503 R Axis:   -29 Text Interpretation:  Sinus tachycardia Borderline left axis deviation  Prolonged QT interval No significant change was found Confirmed by Roarke Marciano   MD, Naoko Diperna ([id number]) on 01/30/2016 12:26:51 AM      MDM   Final diagnoses:  Chest pain, unspecified chest pain type  Hiatal hernia  Cocaine abuse    Patient's pain treated.  Elevated d-dimer.  CTA of the chest however is negative for acute pulmonary embolism.  She does have a moderate sized hiatal hernia and this may be causing her pain.  She'll be started on a PPI.  Primary care follow-up.  She continues to have some discomfort at time of discharge.  Initial troponin had mild elevation however repeat troponin is negative.  I do not believe this is a presentation of acute coronary syndrome.  Her EKG is without ischemic changes.    Jola Schmidt, MD 01/30/16 2350

## 2016-01-29 NOTE — ED Notes (Signed)
MD at bedside. 

## 2016-01-29 NOTE — ED Notes (Signed)
Reported pain to Dr. Venora Maples, MD acknowledges, orders to repeat 36mcg of fentanyl

## 2016-01-29 NOTE — ED Notes (Signed)
Called main lab, blood samples haven't been received. RN at bedside to redraw.

## 2016-01-29 NOTE — ED Notes (Signed)
Spoke with CT for ETA. Patient next on the list for transport

## 2016-01-30 LAB — I-STAT TROPONIN, ED: TROPONIN I, POC: 0.03 ng/mL (ref 0.00–0.08)

## 2016-01-30 MED ORDER — OMEPRAZOLE 20 MG PO CPDR: 20.0000 mg | DELAYED_RELEASE_CAPSULE | Freq: Two times a day (BID) | ORAL | Status: DC

## 2016-01-30 NOTE — Discharge Instructions (Signed)
Nonspecific Chest Pain  Chest pain can be caused by many different conditions. There is always a chance that your pain could be related to something serious, such as a heart attack or a blood clot in your lungs. Chest pain can also be caused by conditions that are not life-threatening. If you have chest pain, it is very important to follow up with your health care provider. CAUSES  Chest pain can be caused by:  Heartburn.  Pneumonia or bronchitis.  Anxiety or stress.  Inflammation around your heart (pericarditis) or lung (pleuritis or pleurisy).  A blood clot in your lung.  A collapsed lung (pneumothorax). It can develop suddenly on its own (spontaneous pneumothorax) or from trauma to the chest.  Shingles infection (varicella-zoster virus).  Heart attack.  Damage to the bones, muscles, and cartilage that make up your chest wall. This can include:  Bruised bones due to injury.  Strained muscles or cartilage due to frequent or repeated coughing or overwork.  Fracture to one or more ribs.  Sore cartilage due to inflammation (costochondritis). RISK FACTORS  Risk factors for chest pain may include:  Activities that increase your risk for trauma or injury to your chest.  Respiratory infections or conditions that cause frequent coughing.  Medical conditions or overeating that can cause heartburn.  Heart disease or family history of heart disease.  Conditions or health behaviors that increase your risk of developing a blood clot.  Having had chicken pox (varicella zoster). SIGNS AND SYMPTOMS Chest pain can feel like:  Burning or tingling on the surface of your chest or deep in your chest.  Crushing, pressure, aching, or squeezing pain.  Dull or sharp pain that is worse when you move, cough, or take a deep breath.  Pain that is also felt in your back, neck, shoulder, or arm, or pain that spreads to any of these areas. Your chest pain may come and go, or it may stay  constant. DIAGNOSIS Lab tests or other studies may be needed to find the cause of your pain. Your health care provider may have you take a test called an ambulatory ECG (electrocardiogram). An ECG records your heartbeat patterns at the time the test is performed. You may also have other tests, such as:  Transthoracic echocardiogram (TTE). During echocardiography, sound waves are used to create a picture of all of the heart structures and to look at how blood flows through your heart.  Transesophageal echocardiogram (TEE).This is a more advanced imaging test that obtains images from inside your body. It allows your health care provider to see your heart in finer detail.  Cardiac monitoring. This allows your health care provider to monitor your heart rate and rhythm in real time.  Holter monitor. This is a portable device that records your heartbeat and can help to diagnose abnormal heartbeats. It allows your health care provider to track your heart activity for several days, if needed.  Stress tests. These can be done through exercise or by taking medicine that makes your heart beat more quickly.  Blood tests.  Imaging tests. TREATMENT  Your treatment depends on what is causing your chest pain. Treatment may include:  Medicines. These may include:  Acid blockers for heartburn.  Anti-inflammatory medicine.  Pain medicine for inflammatory conditions.  Antibiotic medicine, if an infection is present.  Medicines to dissolve blood clots.  Medicines to treat coronary artery disease.  Supportive care for conditions that do not require medicines. This may include:  Resting.  Applying heat  or cold packs to injured areas.  Limiting activities until pain decreases. HOME CARE INSTRUCTIONS  If you were prescribed an antibiotic medicine, finish it all even if you start to feel better.  Avoid any activities that bring on chest pain.  Do not use any tobacco products, including  cigarettes, chewing tobacco, or electronic cigarettes. If you need help quitting, ask your health care provider.  Do not drink alcohol.  Take medicines only as directed by your health care provider.  Keep all follow-up visits as directed by your health care provider. This is important. This includes any further testing if your chest pain does not go away.  If heartburn is the cause for your chest pain, you may be told to keep your head raised (elevated) while sleeping. This reduces the chance that acid will go from your stomach into your esophagus.  Make lifestyle changes as directed by your health care provider. These may include:  Getting regular exercise. Ask your health care provider to suggest some activities that are safe for you.  Eating a heart-healthy diet. A registered dietitian can help you to learn healthy eating options.  Maintaining a healthy weight.  Managing diabetes, if necessary.  Reducing stress. SEEK MEDICAL CARE IF:  Your chest pain does not go away after treatment.  You have a rash with blisters on your chest.  You have a fever. SEEK IMMEDIATE MEDICAL CARE IF:   Your chest pain is worse.  You have an increasing cough, or you cough up blood.  You have severe abdominal pain.  You have severe weakness.  You faint.  You have chills.  You have sudden, unexplained chest discomfort.  You have sudden, unexplained discomfort in your arms, back, neck, or jaw.  You have shortness of breath at any time.  You suddenly start to sweat, or your skin gets clammy.  You feel nauseous or you vomit.  You suddenly feel light-headed or dizzy.  Your heart begins to beat quickly, or it feels like it is skipping beats. These symptoms may represent a serious problem that is an emergency. Do not wait to see if the symptoms will go away. Get medical help right away. Call your local emergency services (911 in the [place] Do not drive yourself to the hospital.   This  information is not intended to replace advice given to you by your health care provider. Make sure you discuss any questions you have with your health care provider.   Document Released: 06/23/2005 Document Revised: 10/04/2014 Document Reviewed: 04/19/2014 Elsevier Interactive Patient Education 2016 [employer]  Hiatal Hernia A hiatal hernia occurs when part of your stomach slides above the muscle that separates your abdomen from your chest (diaphragm). You can be born with a hiatal hernia (congenital), or it may develop over time. In almost all cases of hiatal hernia, only the top part of the stomach pushes through.  Many people have a hiatal hernia with no symptoms. The larger the hernia, the more likely that you will have symptoms. In some cases, a hiatal hernia allows stomach acid to flow back into the tube that carries food from your mouth to your stomach (esophagus). This may cause heartburn symptoms. Severe heartburn symptoms may mean you have developed a condition called gastroesophageal reflux disease (GERD).  CAUSES  Hiatal hernias are caused by a weakness in the opening (hiatus) where your esophagus passes through your diaphragm to attach to the upper part of your stomach. You may be born with a weakness in  your hiatus, or a weakness can develop. RISK FACTORS Older age is a major risk factor for a hiatal hernia. Anything that increases pressure on your diaphragm can also increase your risk of a hiatal hernia. This includes:  Pregnancy.  Excess weight.  Frequent constipation. SIGNS AND SYMPTOMS  People with a hiatal hernia often have no symptoms. If symptoms develop, they are almost always caused by GERD. They may include:  Heartburn.  Belching.  Indigestion.  Trouble swallowing.  Coughing or wheezing.  Sore throat.  Hoarseness.  Chest pain. DIAGNOSIS  A hiatal hernia is sometimes found during an exam for another problem. Your health care provider may suspect a  hiatal hernia if you have symptoms of GERD. Tests may be done to diagnose GERD. These may include:  X-rays of your stomach or chest.  An upper gastrointestinal (GI) series. This is an X-ray exam of your GI tract involving the use of a chalky liquid that you swallow. The liquid shows up clearly on the X-ray.  Endoscopy. This is a procedure to look into your stomach using a thin, flexible tube that has a tiny camera and light on the end of it. TREATMENT  If you have no symptoms, you may not need treatment. If you have symptoms, treatment may include:  Dietary and lifestyle changes to help reduce GERD symptoms.  Medicines. These may include:  Over-the-counter antacids.  Medicines that make your stomach empty more quickly.  Medicines that block the production of stomach acid (H2 blockers).  Stronger medicines to reduce stomach acid (proton pump inhibitors).  You may need surgery to repair the hernia if other treatments are not helping. HOME CARE INSTRUCTIONS   Take all medicines as directed by your health care provider.  Quit smoking, if you smoke.  Try to achieve and maintain a healthy body weight.  Eat frequent small meals instead of three large meals a day. This keeps your stomach from getting too full.  Eat slowly.  Do not lie down right after eating.  Do noteat 1-2 hours before bed.   Do not drink beverages with caffeine. These include cola, coffee, cocoa, and tea.  Do not drink alcohol.  Avoid foods that can make symptoms of GERD worse. These may include:  Fatty foods.  Citrus fruits.  Other foods and drinks that contain acid.  Avoid putting pressure on your belly. Anything that puts pressure on your belly increases the amount of acid that may be pushed up into your esophagus.   Avoid bending over, especially after eating.  Raise the head of your bed by putting blocks under the legs. This keeps your head and esophagus higher than your stomach.  Do not  wear tight clothing around your chest or stomach.  Try not to strain when having a bowel movement, when urinating, or when lifting heavy objects. SEEK MEDICAL CARE IF:  Your symptoms are not controlled with medicines or lifestyle changes.  You are having trouble swallowing.  You have coughing or wheezing that will not go away. SEEK IMMEDIATE MEDICAL CARE IF:  Your pain is getting worse.  Your pain spreads to your arms, neck, jaw, teeth, or back.  You have shortness of breath.  You sweat for no reason.  You feel sick to your stomach (nauseous) or vomit.  You vomit blood.  You have bright red blood in your stools.  You have black, tarry stools.    This information is not intended to replace advice given to you by your health care  provider. Make sure you discuss any questions you have with your health care provider.   Document Released: 12/04/2003 Document Revised: 10/04/2014 Document Reviewed: 08/31/2013 Elsevier Interactive Patient Education [employer].

## 2016-06-14 ENCOUNTER — Emergency Department (HOSPITAL_COMMUNITY)
Admission: EM | Admit: 2016-06-14 | Discharge: 2016-06-15 | Disposition: A | Discharge status: Other Acute Inpatient Hospital | Payer: Medicaid Other | Attending: Emergency Medicine | Admitting: Emergency Medicine

## 2016-06-14 DIAGNOSIS — I110 Hypertensive heart disease with heart failure: Secondary | ICD-10-CM | POA: Diagnosis not present

## 2016-06-14 DIAGNOSIS — Z046 Encounter for general psychiatric examination, requested by authority: Secondary | ICD-10-CM | POA: Diagnosis present

## 2016-06-14 DIAGNOSIS — F329 Major depressive disorder, single episode, unspecified: Secondary | ICD-10-CM | POA: Diagnosis not present

## 2016-06-14 DIAGNOSIS — E119 Type 2 diabetes mellitus without complications: Secondary | ICD-10-CM | POA: Insufficient documentation

## 2016-06-14 DIAGNOSIS — Z91010 Allergy to peanuts: Secondary | ICD-10-CM | POA: Insufficient documentation

## 2016-06-14 DIAGNOSIS — F32A Depression, unspecified: Secondary | ICD-10-CM

## 2016-06-14 DIAGNOSIS — Z79899 Other long term (current) drug therapy: Secondary | ICD-10-CM | POA: Insufficient documentation

## 2016-06-14 DIAGNOSIS — J449 Chronic obstructive pulmonary disease, unspecified: Secondary | ICD-10-CM | POA: Insufficient documentation

## 2016-06-14 DIAGNOSIS — E876 Hypokalemia: Secondary | ICD-10-CM | POA: Diagnosis not present

## 2016-06-14 DIAGNOSIS — I509 Heart failure, unspecified: Secondary | ICD-10-CM | POA: Insufficient documentation

## 2016-06-14 DIAGNOSIS — Z8543 Personal history of malignant neoplasm of ovary: Secondary | ICD-10-CM | POA: Insufficient documentation
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
## 2016-06-14 NOTE — ED Notes (Addendum)
Pt has in belonging bag:  Black sandals, grey pants, grey sweat pants, silver colored ring, black and purple phone, with black charger, silver medical ID bracelet.

## 2016-06-14 NOTE — ED Notes (Signed)
Bed: WTR5 Expected date:  Expected time:  Means of arrival:  Comments: 

## 2016-06-15 ENCOUNTER — Inpatient Hospital Stay (HOSPITAL_COMMUNITY)
Admission: EM | Admit: 2016-06-15 | Discharge: 2016-06-16 | DRG: 882 | Disposition: A | Discharge status: Home / Self Care | Payer: Medicaid Other | Source: Intra-hospital | Attending: Psychiatry | Admitting: Psychiatry

## 2016-06-15 ENCOUNTER — Encounter (HOSPITAL_COMMUNITY): Payer: Self-pay

## 2016-06-15 ENCOUNTER — Emergency Department (HOSPITAL_COMMUNITY): Payer: Medicaid Other

## 2016-06-15 DIAGNOSIS — Z8249 Family history of ischemic heart disease and other diseases of the circulatory system: Secondary | ICD-10-CM

## 2016-06-15 DIAGNOSIS — F329 Major depressive disorder, single episode, unspecified: Secondary | ICD-10-CM | POA: Diagnosis not present

## 2016-06-15 DIAGNOSIS — F17210 Nicotine dependence, cigarettes, uncomplicated: Secondary | ICD-10-CM | POA: Diagnosis present

## 2016-06-15 DIAGNOSIS — F1994 Other psychoactive substance use, unspecified with psychoactive substance-induced mood disorder: Secondary | ICD-10-CM | POA: Diagnosis not present

## 2016-06-15 DIAGNOSIS — Z79899 Other long term (current) drug therapy: Secondary | ICD-10-CM

## 2016-06-15 DIAGNOSIS — Z841 Family history of disorders of kidney and ureter: Secondary | ICD-10-CM

## 2016-06-15 DIAGNOSIS — F4312 Post-traumatic stress disorder, chronic: Principal | ICD-10-CM

## 2016-06-15 DIAGNOSIS — Z791 Long term (current) use of non-steroidal anti-inflammatories (NSAID): Secondary | ICD-10-CM | POA: Diagnosis not present

## 2016-06-15 DIAGNOSIS — F4310 Post-traumatic stress disorder, unspecified: Secondary | ICD-10-CM | POA: Diagnosis present

## 2016-06-15 DIAGNOSIS — G8929 Other chronic pain: Secondary | ICD-10-CM | POA: Diagnosis present

## 2016-06-15 DIAGNOSIS — Z8349 Family history of other endocrine, nutritional and metabolic diseases: Secondary | ICD-10-CM

## 2016-06-15 DIAGNOSIS — E119 Type 2 diabetes mellitus without complications: Secondary | ICD-10-CM | POA: Diagnosis present

## 2016-06-15 DIAGNOSIS — Z833 Family history of diabetes mellitus: Secondary | ICD-10-CM

## 2016-06-15 DIAGNOSIS — Z880 Allergy status to penicillin: Secondary | ICD-10-CM

## 2016-06-15 DIAGNOSIS — Z888 Allergy status to other drugs, medicaments and biological substances status: Secondary | ICD-10-CM

## 2016-06-15 LAB — ETHANOL

## 2016-06-15 LAB — RAPID URINE DRUG SCREEN, HOSP PERFORMED
AMPHETAMINES: NOT DETECTED
Barbiturates: NOT DETECTED
Benzodiazepines: NOT DETECTED
COCAINE: POSITIVE — AB
OPIATES: NOT DETECTED
TETRAHYDROCANNABINOL: NOT DETECTED

## 2016-06-15 LAB — COMPREHENSIVE METABOLIC PANEL
ALT: 14 U/L (ref 14–54)
ANION GAP: 6 (ref 5–15)
AST: 17 U/L (ref 15–41)
Albumin: 3.6 g/dL (ref 3.5–5.0)
Alkaline Phosphatase: 83 U/L (ref 38–126)
BUN: 11 mg/dL (ref 6–20)
CHLORIDE: 113 mmol/L — AB (ref 101–111)
CO2: 22 mmol/L (ref 22–32)
Calcium: 8.9 mg/dL (ref 8.9–10.3)
Creatinine, Ser: 0.71 mg/dL (ref 0.44–1.00)
GFR calc Af Amer: >60 mL/min (ref >60)
Glucose, Bld: 98 mg/dL (ref 65–99)
POTASSIUM: 2.8 mmol/L — AB (ref 3.5–5.1)
SODIUM: 141 mmol/L (ref 135–145)
Total Bilirubin: 0.2 mg/dL — ABNORMAL LOW (ref 0.3–1.2)
Total Protein: 6.5 g/dL (ref 6.5–8.1)

## 2016-06-15 LAB — CBC
HCT: 33.6 % — ABNORMAL LOW (ref 36.0–46.0)
HEMOGLOBIN: 10.9 g/dL — AB (ref 12.0–15.0)
MCH: 24.3 pg — AB (ref 26.0–34.0)
MCHC: 32.4 g/dL (ref 30.0–36.0)
MCV: 75 fL — ABNORMAL LOW (ref 78.0–100.0)
Platelets: 308 10*3/uL (ref 150–400)
RBC: 4.48 MIL/uL (ref 3.87–5.11)
RDW: 15.5 % (ref 11.5–15.5)
WBC: 6.3 10*3/uL (ref 4.0–10.5)

## 2016-06-15 LAB — GLUCOSE, CAPILLARY
GLUCOSE-CAPILLARY: 97 mg/dL (ref 65–99)
GLUCOSE-CAPILLARY: 150 mg/dL — AB (ref 65–99)

## 2016-06-15 LAB — SALICYLATE LEVEL

## 2016-06-15 LAB — ACETAMINOPHEN LEVEL: Acetaminophen (Tylenol), Serum: <10 ug/mL — ABNORMAL LOW (ref 10–30)

## 2016-06-15 MED ORDER — POTASSIUM CHLORIDE CRYS ER 20 MEQ PO TBCR
20.0000 meq | EXTENDED_RELEASE_TABLET | Freq: Two times a day (BID) | ORAL | Status: DC
  Administered 2016-06-15 – 2016-06-16 (×4): 20 meq via ORAL
  Filled 2016-06-15 (×6): qty 1

## 2016-06-15 MED ORDER — GABAPENTIN 800 MG PO TABS
400.0000 mg | ORAL_TABLET | Freq: Three times a day (TID) | ORAL | Status: DC
  Filled 2016-06-15 (×4): qty 0.5

## 2016-06-15 MED ORDER — TRAZODONE HCL 100 MG PO TABS: 100.0000 mg | ORAL_TABLET | Freq: Every day | ORAL | Status: DC

## 2016-06-15 MED ORDER — FERROUS GLUCONATE 324 (38 FE) MG PO TABS: 324.0000 mg | ORAL_TABLET | Freq: Every day | ORAL | Status: DC

## 2016-06-15 MED ORDER — METOPROLOL SUCCINATE ER 50 MG PO TB24
50.0000 mg | ORAL_TABLET | Freq: Every day | ORAL | Status: DC
  Administered 2016-06-15: 50 mg via ORAL
  Filled 2016-06-15 (×4): qty 1

## 2016-06-15 MED ORDER — PANTOPRAZOLE SODIUM 40 MG PO TBEC
40.0000 mg | DELAYED_RELEASE_TABLET | Freq: Every day | ORAL | Status: DC
  Administered 2016-06-15 – 2016-06-16 (×2): 40 mg via ORAL
  Filled 2016-06-15 (×4): qty 1

## 2016-06-15 MED ORDER — POTASSIUM CHLORIDE CRYS ER 20 MEQ PO TBCR
40.0000 meq | EXTENDED_RELEASE_TABLET | Freq: Four times a day (QID) | ORAL | Status: DC
Start: 2016-06-15 — End: 2016-06-15

## 2016-06-15 MED ORDER — PANTOPRAZOLE SODIUM 40 MG PO TBEC: 40.0000 mg | DELAYED_RELEASE_TABLET | Freq: Every day | ORAL | Status: DC

## 2016-06-15 MED ORDER — ONDANSETRON HCL 4 MG PO TABS: 4.0000 mg | ORAL_TABLET | Freq: Three times a day (TID) | ORAL | Status: DC | PRN

## 2016-06-15 MED ORDER — FERROUS GLUCONATE 324 (38 FE) MG PO TABS
324.0000 mg | ORAL_TABLET | Freq: Two times a day (BID) | ORAL | Status: DC
  Filled 2016-06-15 (×3): qty 1

## 2016-06-15 MED ORDER — ENSURE ENLIVE PO LIQD
237.0000 mL | Freq: Two times a day (BID) | ORAL | Status: DC
  Administered 2016-06-15: 237 mL via ORAL

## 2016-06-15 MED ORDER — ALBUTEROL SULFATE (2.5 MG/3ML) 0.083% IN NEBU: 2.5000 mg | INHALATION_SOLUTION | Freq: Four times a day (QID) | RESPIRATORY_TRACT | Status: DC | PRN

## 2016-06-15 MED ORDER — LEVOTHYROXINE SODIUM 125 MCG PO TABS
125.0000 ug | ORAL_TABLET | Freq: Every day | ORAL | Status: DC
  Filled 2016-06-15: qty 1

## 2016-06-15 MED ORDER — TRAZODONE HCL 100 MG PO TABS
100.0000 mg | ORAL_TABLET | Freq: Every day | ORAL | Status: DC
  Administered 2016-06-15: 100 mg via ORAL
  Filled 2016-06-15 (×3): qty 1

## 2016-06-15 MED ORDER — DILTIAZEM HCL ER COATED BEADS 360 MG PO CP24
360.0000 mg | ORAL_CAPSULE | Freq: Every day | ORAL | Status: DC
  Administered 2016-06-15 – 2016-06-16 (×2): 360 mg via ORAL
  Filled 2016-06-15 (×4): qty 1

## 2016-06-15 MED ORDER — ALUM & MAG HYDROXIDE-SIMETH 200-200-20 MG/5ML PO SUSP: 30.0000 mL | ORAL | Status: DC | PRN

## 2016-06-15 MED ORDER — CLONIDINE HCL 0.1 MG PO TABS
0.1000 mg | ORAL_TABLET | Freq: Two times a day (BID) | ORAL | Status: DC
  Administered 2016-06-15 – 2016-06-16 (×4): 0.1 mg via ORAL
  Filled 2016-06-15 (×7): qty 1

## 2016-06-15 MED ORDER — POTASSIUM CHLORIDE CRYS ER 20 MEQ PO TBCR
40.0000 meq | EXTENDED_RELEASE_TABLET | Freq: Once | ORAL | Status: AC
  Administered 2016-06-15: 40 meq via ORAL
  Filled 2016-06-15: qty 2

## 2016-06-15 MED ORDER — ACETAMINOPHEN 325 MG PO TABS
650.0000 mg | ORAL_TABLET | Freq: Four times a day (QID) | ORAL | Status: DC | PRN
  Administered 2016-06-15 – 2016-06-16 (×2): 650 mg via ORAL
  Filled 2016-06-15 (×2): qty 2

## 2016-06-15 MED ORDER — MAGNESIUM OXIDE 400 (241.3 MG) MG PO TABS
400.0000 mg | ORAL_TABLET | Freq: Two times a day (BID) | ORAL | Status: DC
  Administered 2016-06-15 – 2016-06-16 (×4): 400 mg via ORAL
  Filled 2016-06-15 (×7): qty 1

## 2016-06-15 MED ORDER — LEVOTHYROXINE SODIUM 125 MCG PO TABS
125.0000 ug | ORAL_TABLET | Freq: Every day | ORAL | Status: DC
  Administered 2016-06-15 – 2016-06-16 (×2): 125 ug via ORAL
  Filled 2016-06-15 (×4): qty 1

## 2016-06-15 MED ORDER — GABAPENTIN 400 MG PO CAPS: 800.0000 mg | ORAL_CAPSULE | Freq: Three times a day (TID) | ORAL | Status: DC

## 2016-06-15 MED ORDER — MAGNESIUM HYDROXIDE 400 MG/5ML PO SUSP: 30.0000 mL | Freq: Every day | ORAL | Status: DC | PRN

## 2016-06-15 MED ORDER — ENSURE ENLIVE PO LIQD
237.0000 mL | Freq: Every day | ORAL | Status: DC | PRN
  Administered 2016-06-15: 237 mL via ORAL
  Filled 2016-06-15: qty 237

## 2016-06-15 MED ORDER — METOPROLOL SUCCINATE ER 50 MG PO TB24: 50.0000 mg | ORAL_TABLET | Freq: Every day | ORAL | Status: DC

## 2016-06-15 MED ORDER — GABAPENTIN 400 MG PO CAPS
400.0000 mg | ORAL_CAPSULE | Freq: Three times a day (TID) | ORAL | Status: DC
  Administered 2016-06-15 – 2016-06-16 (×6): 400 mg via ORAL
  Filled 2016-06-15 (×10): qty 1

## 2016-06-15 MED ORDER — OXYCODONE HCL 5 MG PO TABS
15.0000 mg | ORAL_TABLET | Freq: Two times a day (BID) | ORAL | Status: DC | PRN
  Administered 2016-06-15 – 2016-06-16 (×3): 15 mg via ORAL
  Filled 2016-06-15 (×3): qty 3

## 2016-06-15 MED ORDER — CYCLOBENZAPRINE HCL 10 MG PO TABS
10.0000 mg | ORAL_TABLET | Freq: Three times a day (TID) | ORAL | Status: DC | PRN
  Administered 2016-06-15 – 2016-06-16 (×4): 10 mg via ORAL
  Filled 2016-06-15 (×4): qty 1

## 2016-06-15 MED ORDER — RISPERIDONE 0.25 MG PO TABS
0.2500 mg | ORAL_TABLET | Freq: Every day | ORAL | Status: DC
  Administered 2016-06-15: 0.25 mg via ORAL
  Filled 2016-06-15 (×2): qty 1

## 2016-06-15 MED ORDER — ZOLPIDEM TARTRATE 5 MG PO TABS: 5.0000 mg | ORAL_TABLET | Freq: Every evening | ORAL | Status: DC | PRN

## 2016-06-15 MED ORDER — NICOTINE 21 MG/24HR TD PT24: 21.0000 mg | MEDICATED_PATCH | Freq: Every day | TRANSDERMAL | Status: DC

## 2016-06-15 MED ORDER — ACETAMINOPHEN 325 MG PO TABS: 650.0000 mg | ORAL_TABLET | ORAL | Status: DC | PRN

## 2016-06-15 MED ORDER — ALBUTEROL SULFATE HFA 108 (90 BASE) MCG/ACT IN AERS: 2.0000 | INHALATION_SPRAY | Freq: Four times a day (QID) | RESPIRATORY_TRACT | Status: DC | PRN

## 2016-06-15 MED ORDER — NITROGLYCERIN 0.4 MG SL SUBL: 0.4000 mg | SUBLINGUAL_TABLET | SUBLINGUAL | Status: DC | PRN

## 2016-06-15 MED ORDER — RISPERIDONE 0.5 MG PO TABS: 0.2500 mg | ORAL_TABLET | Freq: Every day | ORAL | Status: DC

## 2016-06-15 MED ORDER — FERROUS SULFATE 325 (65 FE) MG PO TABS
325.0000 mg | ORAL_TABLET | Freq: Two times a day (BID) | ORAL | Status: DC
  Administered 2016-06-15 – 2016-06-16 (×4): 325 mg via ORAL
  Filled 2016-06-15 (×7): qty 1

## 2016-06-15 MED ORDER — DILTIAZEM HCL ER COATED BEADS 360 MG PO CP24: 360.0000 mg | ORAL_CAPSULE | Freq: Every day | ORAL | Status: DC

## 2016-06-15 MED ORDER — MAGNESIUM OXIDE 400 (241.3 MG) MG PO TABS: 400.0000 mg | ORAL_TABLET | Freq: Two times a day (BID) | ORAL | Status: DC

## 2016-06-15 MED ORDER — OXYCODONE HCL 5 MG PO TABS: 15.0000 mg | ORAL_TABLET | Freq: Three times a day (TID) | ORAL | Status: DC | PRN

## 2016-06-15 MED ORDER — FLUTICASONE PROPIONATE 50 MCG/ACT NA SUSP
2.0000 | Freq: Every day | NASAL | Status: DC
  Administered 2016-06-16: 2 via NASAL
  Filled 2016-06-15: qty 16

## 2016-06-15 NOTE — Progress Notes (Signed)
Patient ID: Erica Andrews, female   DOB: 1980-10-31, 35 y.o.   MRN: [id number]  DAR: Pt. Denies SI/HI and A/V Hallucinations. She reports sleep is poor, appetite is poor, energy level is low, and concentration is poor. She rates depression 7/10, hopelessness 1/10, and anxiety 7/10. Patient does report generalized pain and is receiving PRN Oxycodone for this. She denies any withdrawal symptoms at this time. She reports she is wanting to discharge. Minimal insight at this time. Support and encouragement provided to the patient. Scheduled medications as well as PRN Flexeril administered to patient per physician's orders. Patient is minimal but cooperative. She is seen in the milieu more as the day goes on. She interacts appropriately with her peers. Q15 minute checks are maintained for safety.
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
## 2016-06-15 NOTE — [facility] Group Notes (Signed)
[place] Group Notes:  (Nursing/MHT/Case Management/Adjunct)  Date:  06/15/2016  Time:  9:50 AM  Type of Therapy:  Psychoeducational Skills  Participation Level:  Did Not Attend  Participation Quality:  N/A  Affect:  N/A  Cognitive:  N/A  Insight:  None  Engagement in Group:  None  Modes of Intervention:  Discussion and Education  Summary of Progress/Problems: Patient was invited to group but did not attend.
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
## 2016-06-15 NOTE — ED Notes (Signed)
Dr Stark Jock at bedside to eval pt.
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
## 2016-06-15 NOTE — Tx Team (Addendum)
Interdisciplinary Treatment and Diagnostic Plan Update  06/15/2016 Time of Session: 9:30am Erica Andrews MRN: [id number]  Principal Diagnosis: Chronic post-traumatic stress disorder (PTSD)  Secondary Diagnoses: Active Problems:   Substance induced mood disorder (HCC)   Current Medications:  Current Facility-Administered Medications  Medication Dose Route Frequency Provider Last Rate Last Dose  . acetaminophen (TYLENOL) tablet 650 mg  650 mg Oral Q6H PRN Laverle Hobby, PA-C      . albuterol (PROVENTIL HFA;VENTOLIN HFA) 108 (90 Base) MCG/ACT inhaler 2 puff  2 puff Inhalation Q6H PRN Laverle Hobby, PA-C      . alum & mag hydroxide-simeth (MAALOX/MYLANTA) 200-200-20 MG/5ML suspension 30 mL  30 mL Oral Q4H PRN Laverle Hobby, PA-C      . cloNIDine (CATAPRES) tablet 0.1 mg  0.1 mg Oral BID Laverle Hobby, PA-C   0.1 mg at 06/15/16 0825  . cyclobenzaprine (FLEXERIL) tablet 10 mg  10 mg Oral TID PRN Laverle Hobby, PA-C   10 mg at 06/15/16 0826  . diltiazem (CARDIZEM CD) 24 hr capsule 360 mg  360 mg Oral Daily Laverle Hobby, PA-C   360 mg at 06/15/16 0825  . feeding supplement (ENSURE ENLIVE) (ENSURE ENLIVE) liquid 237 mL  237 mL Oral Daily PRN Myer Peer Cobos, MD      . ferrous sulfate tablet 325 mg  325 mg Oral BID WC Jenne Campus, MD   325 mg at 06/15/16 0825  . fluticasone (FLONASE) 50 MCG/ACT nasal spray 2 spray  2 spray Each Nare Daily Laverle Hobby, PA-C      . gabapentin (NEURONTIN) capsule 400 mg  400 mg Oral TID Jenne Campus, MD   400 mg at 06/15/16 0825  . levothyroxine (SYNTHROID, LEVOTHROID) tablet 125 mcg  125 mcg Oral QAC breakfast Laverle Hobby, PA-C   125 mcg at 06/15/16 0825  . magnesium hydroxide (MILK OF MAGNESIA) suspension 30 mL  30 mL Oral Daily PRN Laverle Hobby, PA-C      . magnesium oxide (MAG-OX) tablet 400 mg  400 mg Oral BID Laverle Hobby, PA-C   400 mg at 06/15/16 0825  . metoprolol succinate (TOPROL-XL) 24 hr tablet 50 mg  50 mg Oral Daily  Laverle Hobby, PA-C   50 mg at 06/15/16 0825  . nitroGLYCERIN (NITROSTAT) SL tablet 0.4 mg  0.4 mg Sublingual Q5 min PRN Laverle Hobby, PA-C      . pantoprazole (PROTONIX) EC tablet 40 mg  40 mg Oral Daily Laverle Hobby, PA-C   40 mg at 06/15/16 0825  . potassium chloride SA (K-DUR,KLOR-CON) CR tablet 20 mEq  20 mEq Oral BID Laverle Hobby, PA-C   20 mEq at 06/15/16 0825  . risperiDONE (RISPERDAL) tablet 0.25 mg  0.25 mg Oral QHS Spencer E Simon, PA-C      . traZODone (DESYREL) tablet 100 mg  100 mg Oral QHS Laverle Hobby, PA-C       PTA Medications: Prescriptions Prior to Admission  Medication Sig Dispense Refill Last Dose  . albuterol (PROVENTIL HFA;VENTOLIN HFA) 108 (90 BASE) MCG/ACT inhaler Inhale 2 puffs into the lungs every 6 (six) hours as needed for wheezing or shortness of breath.    06/14/2016 at Unknown time  . albuterol (PROVENTIL) (2.5 MG/3ML) 0.083% nebulizer solution Take 2.5 mg by nebulization every 6 (six) hours as needed for wheezing or shortness of breath.   06/14/2016 at Unknown time  . clonazePAM (KLONOPIN) 1 MG tablet  Take 1 mg by mouth 3 (three) times daily as needed for anxiety.   06/14/2016 at Unknown time  . cloNIDine (CATAPRES) 0.1 MG tablet Take 0.1 mg by mouth 2 (two) times daily.   06/14/2016 at Unknown time  . cyclobenzaprine (FLEXERIL) 10 MG tablet Take 10 mg by mouth 3 (three) times daily as needed for muscle spasms.    06/14/2016 at Unknown time  . diltiazem (CARDIZEM CD) 360 MG 24 hr capsule Take 360 mg by mouth daily.   06/14/2016 at Unknown time  . EPINEPHrine (EPIPEN 2-PAK) 0.3 mg/0.3 mL IJ SOAJ injection Inject 0.3 mg into the muscle once as needed (for severe allergic reaction).   Past Month at Unknown time  . ferrous gluconate (FERGON) 324 MG tablet Take 324 mg by mouth 2 (two) times daily with a meal.    06/14/2016 at Unknown time  . fluticasone (FLONASE) 50 MCG/ACT nasal spray Place 2 sprays into both nostrils daily.    06/14/2016 at Unknown time  .  gabapentin (NEURONTIN) 800 MG tablet Take 800 mg by mouth 4 (four) times daily.    06/14/2016 at Unknown time  . ibuprofen (ADVIL,MOTRIN) 800 MG tablet Take 800 mg by mouth 2 (two) times daily.   06/14/2016 at 1800  . levothyroxine (SYNTHROID, LEVOTHROID) 125 MCG tablet Take 125 mcg by mouth daily before breakfast.    06/14/2016 at Unknown time  . magnesium oxide (MAG-OX) 400 (241.3 Mg) MG tablet Take 400 mg by mouth 2 (two) times daily.   06/14/2016 at Unknown time  . metoprolol succinate (TOPROL-XL) 50 MG 24 hr tablet Take 50 mg by mouth daily. Take with or immediately following a meal.   06/14/2016 at 1000  . nitroGLYCERIN (NITROSTAT) 0.4 MG SL tablet Place 0.4 mg under the tongue every 5 (five) minutes as needed for chest pain.    Past Month at Unknown time  . omeprazole (PRILOSEC) 20 MG capsule Take 20 mg by mouth 3 (three) times daily.   06/14/2016 at Unknown time  . ondansetron (ZOFRAN) 8 MG tablet Take 8 mg by mouth every 8 (eight) hours as needed for nausea or vomiting.    06/14/2016 at Unknown time  . oxyCODONE (ROXICODONE) 15 MG immediate release tablet Take 15 mg by mouth 4 (four) times daily.   0 06/14/2016 at 1800  . potassium chloride SA (K-DUR,KLOR-CON) 20 MEQ tablet Take 40 mEq by mouth 4 (four) times daily.    06/14/2016 at Unknown time  . promethazine (PHENERGAN) 25 MG tablet Take 1 tablet (25 mg total) by mouth every 6 (six) hours as needed for nausea. 30 tablet 0 06/14/2016 at Unknown time  . risperiDONE (RISPERDAL) 0.25 MG tablet Take 0.25 mg by mouth at bedtime.   06/14/2016 at Unknown time  . SUMAtriptan (IMITREX) 100 MG tablet Take 100 mg by mouth every 2 (two) hours as needed for migraine.    06/14/2016 at Unknown time  . traZODone (DESYREL) 100 MG tablet Take 100 mg by mouth at bedtime.   06/14/2016 at Unknown time    Treatment Modalities: Medication Management, Group therapy, Case management,  1 to 1 session with clinician, Psychoeducation, Recreational therapy.   Physician  Treatment Plan for Primary Diagnosis: Chronic post-traumatic stress disorder (PTSD) Long Term Goal(s): Improvement in symptoms so as ready for discharge   Short Term Goals: Ability to identify changes in lifestyle to reduce recurrence of condition will improve, Ability to identify and develop effective coping behaviors will improve and Compliance with prescribed medications will  improve  Medication Management: Evaluate patient's response, side effects, and tolerance of medication regimen.  Therapeutic Interventions: 1 to 1 sessions, Unit Group sessions and Medication administration.  Evaluation of Outcomes: Not Met  Physician Treatment Plan for Secondary Diagnosis: Active Problems:   Substance induced mood disorder ([place])  Long Term Goal(s): Improvement in symptoms so as ready for discharge  Short Term Goals: Ability to identify changes in lifestyle to reduce recurrence of condition will improve, Ability to identify and develop effective coping behaviors will improve and Compliance with prescribed medications will improve  Medication Management: Evaluate patient's response, side effects, and tolerance of medication regimen.  Therapeutic Interventions: 1 to 1 sessions, Unit Group sessions and Medication administration.  Evaluation of Outcomes: Not Met   RN Treatment Plan for Primary Diagnosis: Chronic post-traumatic stress disorder (PTSD) Long Term Goal(s): Knowledge of disease and therapeutic regimen to maintain health will improve  Short Term Goals: Ability to remain free from injury will improve, Ability to identify and develop effective coping behaviors will improve and Compliance with prescribed medications will improve  Medication Management: RN will administer medications as ordered by provider, will assess and evaluate patient's response and provide education to patient for prescribed medication. RN will report any adverse and/or side effects to prescribing  provider.  Therapeutic Interventions: 1 on 1 counseling sessions, Psychoeducation, Medication administration, Evaluate responses to treatment, Monitor vital signs and CBGs as ordered, Perform/monitor CIWA, COWS, AIMS and Fall Risk screenings as ordered, Perform wound care treatments as ordered.  Evaluation of Outcomes: Not Met   LCSW Treatment Plan for Primary Diagnosis: Chronic post-traumatic stress disorder (PTSD) Long Term Goal(s): Safe transition to appropriate next level of care at discharge, Engage patient in therapeutic group addressing interpersonal concerns.  Short Term Goals: Engage patient in aftercare planning with referrals and resources, Increase emotional regulation and Increase skills for wellness and recovery  Therapeutic Interventions: Assess for all discharge needs, 1 to 1 time with Social worker, Explore available resources and support systems, Assess for adequacy in community support network, Educate family and significant other(s) on suicide prevention, Complete Psychosocial Assessment, Interpersonal group therapy.  Evaluation of Outcomes: Not Met   Progress in Treatment :  Attending groups: Continuing to assess  Participating in groups: Continuing to assess  Taking medication as prescribed: Yes, MD continuing to assess for appropriate medication regimen  Toleration medication: Yes  Family/Significant other contact made: Treatment team assessing for appropriate contacts  Patient understands diagnosis: Yes  Discussing patient identified problems/goals with staff: Yes  Medical problems stabilized or resolved: Yes  Denies suicidal/homicidal ideation: Treatment team continuing to asses  Issues/concerns per patient self-inventory: None reported  Other: N/A  New problem(s) identified: None reported at this time    New Short Term/Long Term Goal(s): None at this time    Discharge Plan or Barriers: Treatment team continuing to assess.    Reason for  Continuation of Hospitalization: Anxiety Depression Medication stabilization Suicidal Ideations Withdrawal symptoms  Estimated Length of Stay: 3-5 days    Attendees:  Patient:   Physician: Dr. Parke Poisson, Dr. Shea Evans, Dr. Sharolyn Douglas , MD  06/15/2016   9:30am  Nursing: Franco Nones, RN 06/15/2016 9:30am  RN Care Manager: Lars Pinks, [place]  06/15/2016 9:30am  Social Workers: Peri Maris, LCSW, Leauna Sharber, LCSW  06/15/2016 9:30am  Nurse Pratictioners: Samuel Jester, NP, Lindell Spar, NP 06/15/2016 9:30am  Other:  06/15/2016 9:30am    Scribe for Treatment Team: Tilden Fossa, [place] Worker [facility] [phone]

## 2016-06-15 NOTE — Tx Team (Signed)
Initial Treatment Plan 06/15/2016 5:36 AM Ferol Luz [id number]    PATIENT STRESSORS: Financial difficulties Marital or family conflict Medication change or noncompliance   PATIENT STRENGTHS: General fund of knowledge Motivation for treatment/growth   PATIENT IDENTIFIED PROBLEMS: "Help with depression"  "get back on my medicine"                   DISCHARGE CRITERIA:  Improved stabilization in mood, thinking, and/or behavior Motivation to continue treatment in a less acute level of care Verbal commitment to aftercare and medication compliance  PRELIMINARY DISCHARGE PLAN: Attend aftercare/continuing care group Outpatient therapy Placement in alternative living arrangements  PATIENT/FAMILY INVOLVEMENT: This treatment plan has been presented to and reviewed with the patient, Erica Andrews, and/or family member, .  The patient and family have been given the opportunity to ask questions and make suggestions.  Migdalia Dk, RN 06/15/2016, 5:36 AM
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
## 2016-06-15 NOTE — ED Notes (Signed)
Report called to [place], [facility].  Pending [facility] transport.
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
## 2016-06-15 NOTE — ED Notes (Signed)
[facility] transport requested. 

## 2016-06-15 NOTE — Progress Notes (Signed)
Adult Psychoeducational Group Note  Date:  06/15/2016 Time:  9:14 PM  Group Topic/Focus:  Wrap-Up Group:   The focus of this group is to help patients review their daily goal of treatment and discuss progress on daily workbooks.   Participation Level:  Minimal  Participation Quality:  Appropriate  Affect:  Flat  Cognitive:  Appropriate  Insight: Limited  Engagement in Group:  Improving  Modes of Intervention:  Activity  Additional Comments:  Patient rated her day a 5. Goal is to get ready for discharge and get back to family. Donato Heinz 06/15/2016, 9:14 PM
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
## 2016-06-15 NOTE — BHH Group Notes (Signed)
[place] LCSW Group Therapy 06/15/2016  1:15 PM   Type of Therapy: Group Therapy  Participation Level: Did Not Attend. Patient invited to participate but declined.   Tilden Fossa, MSW, [place] Clinical Social Worker [facility] [phone]

## 2016-06-16 ENCOUNTER — Encounter (HOSPITAL_COMMUNITY): Payer: Self-pay | Admitting: Psychiatry

## 2016-06-16 DIAGNOSIS — F4312 Post-traumatic stress disorder, chronic: Secondary | ICD-10-CM

## 2016-06-16 LAB — BASIC METABOLIC PANEL
Anion gap: 6 (ref 5–15)
BUN: 16 mg/dL (ref 6–20)
CALCIUM: 9.6 mg/dL (ref 8.9–10.3)
CO2: 26 mmol/L (ref 22–32)
CREATININE: 0.78 mg/dL (ref 0.44–1.00)
Chloride: 107 mmol/L (ref 101–111)
GFR calc Af Amer: >60 mL/min (ref >60)
GLUCOSE: 93 mg/dL (ref 65–99)
Potassium: 3.8 mmol/L (ref 3.5–5.1)
Sodium: 139 mmol/L (ref 135–145)

## 2016-06-16 LAB — LIPID PANEL
CHOL/HDL RATIO: 3.6 ratio
Cholesterol: 174 mg/dL (ref 0–200)
HDL: 48 mg/dL (ref >40)
LDL CALC: 105 mg/dL — AB (ref 0–99)
Triglycerides: 105 mg/dL (ref <150)
VLDL: 21 mg/dL (ref 0–40)

## 2016-06-16 LAB — TSH: TSH: 4.185 u[IU]/mL (ref 0.350–4.500)

## 2016-06-16 LAB — MAGNESIUM: Magnesium: 1.8 mg/dL (ref 1.7–2.4)

## 2016-06-16 MED ORDER — SUMATRIPTAN SUCCINATE 50 MG PO TABS
100.0000 mg | ORAL_TABLET | ORAL | Status: DC | PRN
  Administered 2016-06-16: 100 mg via ORAL
  Filled 2016-06-16: qty 2

## 2016-06-16 MED ORDER — FLUTICASONE PROPIONATE 50 MCG/ACT NA SUSP: 2.0000 | Freq: Every day | NASAL | 0 refills | Status: AC

## 2016-06-16 MED ORDER — ALBUTEROL SULFATE HFA 108 (90 BASE) MCG/ACT IN AERS: 2.0000 | INHALATION_SPRAY | Freq: Four times a day (QID) | RESPIRATORY_TRACT | Status: AC | PRN

## 2016-06-16 MED ORDER — SUMATRIPTAN SUCCINATE 100 MG PO TABS: 100.0000 mg | ORAL_TABLET | ORAL | 0 refills | Status: AC | PRN

## 2016-06-16 MED ORDER — FERROUS GLUCONATE 324 (38 FE) MG PO TABS: 324.0000 mg | ORAL_TABLET | Freq: Two times a day (BID) | ORAL | 0 refills | Status: AC

## 2016-06-16 MED ORDER — OMEPRAZOLE 20 MG PO CPDR: 20.0000 mg | DELAYED_RELEASE_CAPSULE | Freq: Three times a day (TID) | ORAL | Status: AC

## 2016-06-16 MED ORDER — CLONIDINE HCL 0.1 MG PO TABS: 0.1000 mg | ORAL_TABLET | Freq: Two times a day (BID) | ORAL | 0 refills | Status: AC

## 2016-06-16 MED ORDER — NITROGLYCERIN 0.4 MG SL SUBL: 0.4000 mg | SUBLINGUAL_TABLET | SUBLINGUAL | 12 refills | Status: AC | PRN

## 2016-06-16 MED ORDER — GABAPENTIN 800 MG PO TABS: 400.0000 mg | ORAL_TABLET | Freq: Three times a day (TID) | ORAL | Status: AC

## 2016-06-16 MED ORDER — LEVOTHYROXINE SODIUM 125 MCG PO TABS: 125.0000 ug | ORAL_TABLET | Freq: Every day | ORAL | Status: AC

## 2016-06-16 MED ORDER — CYCLOBENZAPRINE HCL 10 MG PO TABS: 10.0000 mg | ORAL_TABLET | Freq: Three times a day (TID) | ORAL | 0 refills | Status: AC | PRN

## 2016-06-16 MED ORDER — METOPROLOL SUCCINATE ER 50 MG PO TB24: 50.0000 mg | ORAL_TABLET | Freq: Every day | ORAL | Status: AC

## 2016-06-16 MED ORDER — TRAZODONE HCL 100 MG PO TABS: 100.0000 mg | ORAL_TABLET | Freq: Every day | ORAL | Status: AC

## 2016-06-16 MED ORDER — MAGNESIUM OXIDE 400 (241.3 MG) MG PO TABS: 400.0000 mg | ORAL_TABLET | Freq: Two times a day (BID) | ORAL | Status: AC

## 2016-06-16 MED ORDER — DILTIAZEM HCL ER COATED BEADS 360 MG PO CP24: 360.0000 mg | ORAL_CAPSULE | Freq: Every day | ORAL | Status: AC

## 2016-06-16 MED ORDER — OXYCODONE HCL 15 MG PO TABS: 15.0000 mg | ORAL_TABLET | Freq: Four times a day (QID) | ORAL | 0 refills | Status: AC

## 2016-06-16 MED ORDER — POTASSIUM CHLORIDE CRYS ER 20 MEQ PO TBCR: 20.0000 meq | EXTENDED_RELEASE_TABLET | Freq: Two times a day (BID) | ORAL | Status: AC
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
## 2016-06-16 NOTE — BHH Group Notes (Signed)
[place] LCSW Group Therapy 06/16/2016  1:15 PM   Type of Therapy: Group Therapy  Participation Level: Did Not Attend. Patient invited to participate but declined.   Tilden Fossa, MSW, [place] Clinical Social Worker [facility] [phone]

## 2016-06-16 NOTE — BHH Group Notes (Signed)
.  [facility] LCSW Aftercare Discharge Planning Group Note  06/16/2016  8:45 AM  Participation Quality: Did Not Attend. Patient invited to participate but declined.  Tilden Fossa, MSW, [place] Clinical Social Worker [facility] [phone]

## 2016-06-16 NOTE — Progress Notes (Signed)
Recreation Therapy Notes  Date: 06/16/16 Time: 0930 Location: 300 Hall Group Room  Group Topic: Stress Management  Goal Area(s) Addresses:  Patient will verbalize importance of using healthy stress management.  Patient will identify positive emotions associated with healthy stress management.   Intervention: Stress Management  Activity :  Latina Craver Imagery.  LRT introduced the technique of guided imagery to patients.  Patients were to follow along as LRT read script.  LRT read script so patients could participate in the technique.  Education:  Stress Management, Discharge Planning.   Education Outcome: Needs additional education  Clinical Observations/Feedback: Pt did not attend group.   Victorino Sparrow, LRT/CTRS         Victorino Sparrow A 06/16/2016 12:29 PM

## 2016-06-16 NOTE — BHH Suicide Risk Assessment (Signed)
[facility] INPATIENT:  Family/Significant Other Suicide Prevention Education  Suicide Prevention Education:  Patient Refusal for Family/Significant Other Suicide Prevention Education: The patient Erica Andrews has refused to provide written consent for family/significant other to be provided Family/Significant Other Suicide Prevention Education during admission and/or prior to discharge.  Physician notified. SPE reviewed with patient and brochure provided. Patient encouraged to return to hospital if having suicidal thoughts, patient verbalized his/her understanding and has no further questions at this time.   Yahya Boldman L Kimball Appleby 06/16/2016, 11:01 AM

## 2016-06-16 NOTE — Progress Notes (Signed)
  [facility] Adult Case Management Discharge Plan :  Will you be returning to the same living situation after discharge:  Yes,  patient plans to return home At discharge, do you have transportation home?: Yes,  husband to pick up Do you have the ability to pay for your medications: Yes,  patient will be provided with prescriptions at discharge  Release of information consent forms completed and in the chart;  Patient's signature needed at discharge.  Patient to Follow up at: [place] .   Specialty:  Professional Counselor Why:  Please go to walk-in clinic within 7 days of discharge Monday-Friday at 8am for assessment for therapy and medication management services. Contact information: [facility] [place] [address] [phone]           Next level of care provider has access to [place] and Suicide Prevention discussed: Yes,  with patient  Have you used any form of tobacco in the last 30 days? (Cigarettes, Smokeless Tobacco, Cigars, and/or Pipes): Yes  Has patient been referred to the Quitline?: Patient refused referral  Patient has been referred for addiction treatment: Yes  Erica Andrews 06/16/2016, 11:09 AM
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
## 2016-06-16 NOTE — Progress Notes (Signed)
Adult Psychoeducational Group Note  Date:  06/16/2016 Time:  4:37 PM  Group Topic/Focus:  Coping With Mental Health Crisis:   The purpose of this group is to help patients identify strategies for coping with mental health crisis.  Group discusses possible causes of crisis and ways to manage them effectively.   Participation Level:  Did Not Attend  Participation Quality:  Did not attend group  Additional Comments:  Pt did not attend group. Abe People Brittini 06/16/2016, 4:37 PM

## 2016-06-16 NOTE — Tx Team (Signed)
Interdisciplinary Treatment and Diagnostic Plan Update  06/16/2016 Time of Session: 9:30am Erica Andrews MRN: [id number]  Principal Diagnosis: Chronic post-traumatic stress disorder (PTSD)  Secondary Diagnoses: Principal Problem:   Chronic post-traumatic stress disorder (PTSD)   Current Medications:  Current Facility-Administered Medications  Medication Dose Route Frequency Provider Last Rate Last Dose  . acetaminophen (TYLENOL) tablet 650 mg  650 mg Oral Q6H PRN Laverle Hobby, PA-C   650 mg at 06/16/16 0809  . albuterol (PROVENTIL HFA;VENTOLIN HFA) 108 (90 Base) MCG/ACT inhaler 2 puff  2 puff Inhalation Q6H PRN Laverle Hobby, PA-C      . alum & mag hydroxide-simeth (MAALOX/MYLANTA) 200-200-20 MG/5ML suspension 30 mL  30 mL Oral Q4H PRN Laverle Hobby, PA-C      . cloNIDine (CATAPRES) tablet 0.1 mg  0.1 mg Oral BID Laverle Hobby, PA-C   0.1 mg at 06/16/16 0805  . cyclobenzaprine (FLEXERIL) tablet 10 mg  10 mg Oral TID PRN Laverle Hobby, PA-C   10 mg at 06/16/16 0809  . diltiazem (CARDIZEM CD) 24 hr capsule 360 mg  360 mg Oral Daily Laverle Hobby, PA-C   360 mg at 06/16/16 0804  . feeding supplement (ENSURE ENLIVE) (ENSURE ENLIVE) liquid 237 mL  237 mL Oral Daily PRN Jenne Campus, MD   237 mL at 06/15/16 2003  . ferrous sulfate tablet 325 mg  325 mg Oral BID WC Jenne Campus, MD   325 mg at 06/16/16 0804  . fluticasone (FLONASE) 50 MCG/ACT nasal spray 2 spray  2 spray Each Nare Daily Laverle Hobby, PA-C   2 spray at 06/16/16 [id number]  . gabapentin (NEURONTIN) capsule 400 mg  400 mg Oral TID Jenne Campus, MD   400 mg at 06/16/16 0804  . levothyroxine (SYNTHROID, LEVOTHROID) tablet 125 mcg  125 mcg Oral QAC breakfast Laverle Hobby, PA-C   125 mcg at 06/16/16 0610  . magnesium hydroxide (MILK OF MAGNESIA) suspension 30 mL  30 mL Oral Daily PRN Laverle Hobby, PA-C      . magnesium oxide (MAG-OX) tablet 400 mg  400 mg Oral BID Laverle Hobby, PA-C   400 mg at 06/16/16  0804  . metoprolol succinate (TOPROL-XL) 24 hr tablet 50 mg  50 mg Oral Daily Laverle Hobby, PA-C   50 mg at 06/16/16 0804  . nitroGLYCERIN (NITROSTAT) SL tablet 0.4 mg  0.4 mg Sublingual Q5 min PRN Laverle Hobby, PA-C      . oxyCODONE (Oxy IR/ROXICODONE) immediate release tablet 15 mg  15 mg Oral Q12H PRN Linard Millers, MD   15 mg at 06/16/16 0359  . pantoprazole (PROTONIX) EC tablet 40 mg  40 mg Oral Daily Laverle Hobby, PA-C   40 mg at 06/16/16 0804  . potassium chloride SA (K-DUR,KLOR-CON) CR tablet 20 mEq  20 mEq Oral BID Laverle Hobby, PA-C   20 mEq at 06/16/16 0804  . SUMAtriptan (IMITREX) tablet 100 mg  100 mg Oral Q2H PRN Linard Millers, MD      . traZODone (DESYREL) tablet 100 mg  100 mg Oral QHS Laverle Hobby, PA-C   100 mg at 06/15/16 2109   PTA Medications: Prescriptions Prior to Admission  Medication Sig Dispense Refill Last Dose  . albuterol (PROVENTIL HFA;VENTOLIN HFA) 108 (90 BASE) MCG/ACT inhaler Inhale 2 puffs into the lungs every 6 (six) hours as needed for wheezing or shortness of breath.    06/14/2016 at Unknown  time  . albuterol (PROVENTIL) (2.5 MG/3ML) 0.083% nebulizer solution Take 2.5 mg by nebulization every 6 (six) hours as needed for wheezing or shortness of breath.   06/14/2016 at Unknown time  . clonazePAM (KLONOPIN) 1 MG tablet Take 1 mg by mouth 3 (three) times daily as needed for anxiety.   06/14/2016 at Unknown time  . cloNIDine (CATAPRES) 0.1 MG tablet Take 0.1 mg by mouth 2 (two) times daily.   06/14/2016 at Unknown time  . cyclobenzaprine (FLEXERIL) 10 MG tablet Take 10 mg by mouth 3 (three) times daily as needed for muscle spasms.    06/14/2016 at Unknown time  . diltiazem (CARDIZEM CD) 360 MG 24 hr capsule Take 360 mg by mouth daily.   06/14/2016 at Unknown time  . EPINEPHrine (EPIPEN 2-PAK) 0.3 mg/0.3 mL IJ SOAJ injection Inject 0.3 mg into the muscle once as needed (for severe allergic reaction).   Past Month at Unknown time  . ferrous  gluconate (FERGON) 324 MG tablet Take 324 mg by mouth 2 (two) times daily with a meal.    06/14/2016 at Unknown time  . fluticasone (FLONASE) 50 MCG/ACT nasal spray Place 2 sprays into both nostrils daily.    06/14/2016 at Unknown time  . gabapentin (NEURONTIN) 800 MG tablet Take 800 mg by mouth 4 (four) times daily.    06/14/2016 at Unknown time  . ibuprofen (ADVIL,MOTRIN) 800 MG tablet Take 800 mg by mouth 2 (two) times daily.   06/14/2016 at 1800  . levothyroxine (SYNTHROID, LEVOTHROID) 125 MCG tablet Take 125 mcg by mouth daily before breakfast.    06/14/2016 at Unknown time  . magnesium oxide (MAG-OX) 400 (241.3 Mg) MG tablet Take 400 mg by mouth 2 (two) times daily.   06/14/2016 at Unknown time  . metoprolol succinate (TOPROL-XL) 50 MG 24 hr tablet Take 50 mg by mouth daily. Take with or immediately following a meal.   06/14/2016 at 1000  . nitroGLYCERIN (NITROSTAT) 0.4 MG SL tablet Place 0.4 mg under the tongue every 5 (five) minutes as needed for chest pain.    Past Month at Unknown time  . omeprazole (PRILOSEC) 20 MG capsule Take 20 mg by mouth 3 (three) times daily.   06/14/2016 at Unknown time  . ondansetron (ZOFRAN) 8 MG tablet Take 8 mg by mouth every 8 (eight) hours as needed for nausea or vomiting.    06/14/2016 at Unknown time  . oxyCODONE (ROXICODONE) 15 MG immediate release tablet Take 15 mg by mouth 4 (four) times daily.   0 06/14/2016 at 1800  . potassium chloride SA (K-DUR,KLOR-CON) 20 MEQ tablet Take 40 mEq by mouth 4 (four) times daily.    06/14/2016 at Unknown time  . promethazine (PHENERGAN) 25 MG tablet Take 1 tablet (25 mg total) by mouth every 6 (six) hours as needed for nausea. 30 tablet 0 06/14/2016 at Unknown time  . risperiDONE (RISPERDAL) 0.25 MG tablet Take 0.25 mg by mouth at bedtime.   06/14/2016 at Unknown time  . SUMAtriptan (IMITREX) 100 MG tablet Take 100 mg by mouth every 2 (two) hours as needed for migraine.    06/14/2016 at Unknown time  . traZODone (DESYREL) 100 MG tablet  Take 100 mg by mouth at bedtime.   06/14/2016 at Unknown time    Treatment Modalities: Medication Management, Group therapy, Case management,  1 to 1 session with clinician, Psychoeducation, Recreational therapy.   Physician Treatment Plan for Primary Diagnosis: Chronic post-traumatic stress disorder (PTSD) Long Term Goal(s): Improvement in  symptoms so as ready for discharge   Short Term Goals: Ability to identify changes in lifestyle to reduce recurrence of condition will improve, Ability to identify and develop effective coping behaviors will improve and Compliance with prescribed medications will improve  Medication Management: Evaluate patient's response, side effects, and tolerance of medication regimen.  Therapeutic Interventions: 1 to 1 sessions, Unit Group sessions and Medication administration.  Evaluation of Outcomes: Adequate for Discharge  Physician Treatment Plan for Secondary Diagnosis: Principal Problem:   Chronic post-traumatic stress disorder (PTSD)  Long Term Goal(s): Improvement in symptoms so as ready for discharge  Short Term Goals: Ability to identify changes in lifestyle to reduce recurrence of condition will improve, Ability to identify and develop effective coping behaviors will improve and Compliance with prescribed medications will improve  Medication Management: Evaluate patient's response, side effects, and tolerance of medication regimen.  Therapeutic Interventions: 1 to 1 sessions, Unit Group sessions and Medication administration.  Evaluation of Outcomes: Adequate for Discharge   RN Treatment Plan for Primary Diagnosis: Chronic post-traumatic stress disorder (PTSD) Long Term Goal(s): Knowledge of disease and therapeutic regimen to maintain health will improve  Short Term Goals: Ability to remain free from injury will improve, Ability to identify and develop effective coping behaviors will improve and Compliance with prescribed medications will  improve  Medication Management: RN will administer medications as ordered by provider, will assess and evaluate patient's response and provide education to patient for prescribed medication. RN will report any adverse and/or side effects to prescribing provider.  Therapeutic Interventions: 1 on 1 counseling sessions, Psychoeducation, Medication administration, Evaluate responses to treatment, Monitor vital signs and CBGs as ordered, Perform/monitor CIWA, COWS, AIMS and Fall Risk screenings as ordered, Perform wound care treatments as ordered.  Evaluation of Outcomes: Adequate for Discharge   LCSW Treatment Plan for Primary Diagnosis: Chronic post-traumatic stress disorder (PTSD) Long Term Goal(s): Safe transition to appropriate next level of care at discharge, Engage patient in therapeutic group addressing interpersonal concerns.  Short Term Goals: Engage patient in aftercare planning with referrals and resources, Increase emotional regulation and Increase skills for wellness and recovery  Therapeutic Interventions: Assess for all discharge needs, 1 to 1 time with Social worker, Explore available resources and support systems, Assess for adequacy in community support network, Educate family and significant other(s) on suicide prevention, Complete Psychosocial Assessment, Interpersonal group therapy.  Evaluation of Outcomes: Adequate for Discharge   Progress in Treatment :  Attending groups: Continuing to assess  Participating in groups: Continuing to assess  Taking medication as prescribed: Yes, MD continuing to assess for appropriate medication regimen  Toleration medication: Yes  Family/Significant other contact made: No, patient declines family contact  Patient understands diagnosis: Yes  Discussing patient identified problems/goals with staff: Yes  Medical problems stabilized or resolved: Yes  Denies suicidal/homicidal ideation: Yes, denies  Issues/concerns per patient  self-inventory: None reported  Other: N/A  New problem(s) identified: None reported at this time    New Short Term/Long Term Goal(s): None at this time    Discharge Plan or Barriers: Patient plans to return home to follow up with outpatient services    Reason for Continuation of Hospitalization: Anxiety Depression Medication stabilization Suicidal Ideations Withdrawal symptoms  Estimated Length of Stay: Discharge anticipated for today 06/16/16    Attendees:  Patient:   Physician: Dr. Sharolyn Douglas , MD  06/16/2016   9:30am  Nursing: Alric Ran Friendman RN 06/16/2016 9:30am  RN Care Manager: Lars Pinks, CM  06/16/2016 9:30am  Social Workers: Peri Maris, LCSW, Tilden Fossa, LCSW  06/16/2016 9:30am  Nurse Pratictioners: Samuel Jester, NP, Lindell Spar, NP 06/16/2016 9:30am  Other:  06/16/2016 9:30am    Scribe for Treatment Team: Tilden Fossa, [place] Worker [facility] [phone]

## 2016-06-16 NOTE — Progress Notes (Signed)
Patient ID: Erica Andrews, female   DOB: 12-06-1980, 35 y.o.   MRN: [id number] Discharge note:  Patient discharged home per MD order.  Patient received all personal belongings from unit and locker.  She denies any thoughts of self harm.  Patient was upset because she stated her husband was picking her up.  Her husband came out and dropped her keys off in the lobby and left the car.  Reviewed AVS/transitition record with patient and she indicated understanding.  Patient was not eligible for prescriptions, as she had all her meds at home.  She is to follow up with [place].  She left ambulatory in her own transportation.
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
## 2016-06-17 LAB — HEMOGLOBIN A1C
HEMOGLOBIN A1C: 5.4 % (ref 4.8–5.6)
MEAN PLASMA GLUCOSE: 108 mg/dL

## 2016-06-23 ENCOUNTER — Ambulatory Visit (HOSPITAL_COMMUNITY)
Admission: RE | Admit: 2016-06-23 | Discharge: 2016-06-23 | Disposition: A | Discharge status: Home / Self Care | Payer: Medicaid Other | Attending: Psychiatry | Admitting: Psychiatry

## 2016-06-23 DIAGNOSIS — F331 Major depressive disorder, recurrent, moderate: Secondary | ICD-10-CM | POA: Insufficient documentation
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
## 2016-07-21 ENCOUNTER — Inpatient Hospital Stay
Admit: 2016-07-21 | Discharge: 2016-07-21 | Disposition: A | Discharge status: Home / Self Care | Attending: Emergency Medicine

## 2016-07-21 ENCOUNTER — Emergency Department: Admit: 2016-07-21 | Primary: Family

## 2016-07-21 DIAGNOSIS — R079 Chest pain, unspecified: Secondary | ICD-10-CM

## 2016-07-21 LAB — COMPREHENSIVE METABOLIC PANEL
ALT: 12 U/L (ref 4–36)
AST: 15 U/L (ref 8–33)
Albumin/Globulin Ratio: 1.2 (ref 0.8–2.0)
Albumin: 3.9 g/dL (ref 3.4–4.8)
Alkaline Phosphatase: 149 U/L — ABNORMAL HIGH (ref 25–100)
Anion Gap: 13 (ref 3–16)
BUN: 10 mg/dL (ref 6–20)
CO2: 24 mmol/L (ref 20–30)
Calcium: 9.4 mg/dL (ref 8.5–10.5)
Chloride: 103 mmol/L (ref 98–107)
Creatinine: 0.9 mg/dL (ref 0.4–1.2)
GFR African American: >59 (ref >59)
GFR Non-African American: >60 (ref >59)
Globulin: 3.3 g/dL
Glucose: 108 mg/dL — ABNORMAL HIGH (ref 74–106)
Potassium: 4.1 mmol/L (ref 3.4–5.1)
Sodium: 140 mmol/L (ref 136–145)
Total Bilirubin: <0.2 mg/dL — ABNORMAL LOW (ref 0.3–1.2)
Total Protein: 7.2 g/dL (ref 6.4–8.3)

## 2016-07-21 LAB — CBC WITH AUTO DIFFERENTIAL
Basophils %: 0.3 %
Basophils Absolute: 0 10*3/uL (ref 0.0–0.1)
Eosinophils %: 5.5 %
Eosinophils Absolute: 0.5 10*3/uL — ABNORMAL HIGH (ref 0.0–0.4)
Hematocrit: 36.7 % — ABNORMAL LOW (ref 37.0–47.0)
Hemoglobin: 11.8 g/dL (ref 11.5–16.5)
Lymphocytes %: 22.4 % (ref 20.0–40.0)
Lymphocytes Absolute: 2.2 10*3/uL (ref 1.5–4.0)
MCH: 25.2 pg — ABNORMAL LOW (ref 27.0–32.0)
MCHC: 32.2 g/dL (ref 31.0–35.0)
MCV: 78.3 fL — ABNORMAL LOW (ref 80.0–100.0)
MPV: 10.4 fL — ABNORMAL HIGH (ref 6.0–10.0)
Monocytes %: 4.6 % (ref 3.0–10.0)
Monocytes Absolute: 0.5 10*3/uL (ref 0.2–0.8)
Neutrophils %: 67.2 %
Neutrophils Absolute: 6.5 10*3/uL (ref 2.0–7.5)
Platelets: 246 10*3/uL (ref 150–400)
RBC: 4.69 M/uL (ref 3.80–5.80)
RDW: 15.9 % (ref 11.0–16.0)
WBC: 9.7 10*3/uL (ref 4.0–11.0)

## 2016-07-21 LAB — D-DIMER, QUANTITATIVE: D-Dimer, Quant: <100 ng/mL DDU (ref <600)

## 2016-07-21 LAB — MAGNESIUM: Magnesium: 1.7 mg/dL (ref 1.7–2.4)

## 2016-07-21 LAB — TROPONIN: Troponin: <0.3 ng/mL (ref <0.30)

## 2016-07-21 MED ORDER — CLONIDINE HCL 0.1 MG PO TABS
0.1 MG | ORAL_TABLET | Freq: Two times a day (BID) | ORAL | 0 refills | Status: DC
Start: 2016-07-21 — End: 2018-10-14

## 2016-07-21 MED ORDER — DOXAZOSIN MESYLATE 4 MG PO TABS
4 MG | ORAL_TABLET | Freq: Every evening | ORAL | 0 refills | Status: DC
Start: 2016-07-21 — End: 2018-10-14

## 2016-07-21 MED ORDER — MORPHINE SULFATE (PF) 4 MG/ML IV SOLN
4 MG/ML | INTRAVENOUS | Status: DC | PRN
Start: 2016-07-21 — End: 2016-07-21
  Administered 2016-07-21: 20:00:00 4 mg via INTRAVENOUS

## 2016-07-21 MED ORDER — DILTIAZEM HCL ER COATED BEADS 360 MG PO CP24
360 MG | ORAL_CAPSULE | Freq: Every day | ORAL | 0 refills | Status: DC
Start: 2016-07-21 — End: 2018-10-14

## 2016-07-21 MED ORDER — GABAPENTIN 400 MG PO CAPS
400 MG | Freq: Once | ORAL | Status: AC
Start: 2016-07-21 — End: 2016-07-21
  Administered 2016-07-21: 21:00:00 800 mg via ORAL

## 2016-07-21 MED ORDER — OMEPRAZOLE 20 MG PO CPDR
20 MG | ORAL_CAPSULE | Freq: Every day | ORAL | 0 refills | Status: DC
Start: 2016-07-21 — End: 2018-10-14

## 2016-07-21 MED ORDER — ASPIRIN 81 MG PO CHEW
81 MG | Freq: Once | ORAL | Status: AC
Start: 2016-07-21 — End: 2016-07-21
  Administered 2016-07-21: 20:00:00 324 mg via ORAL

## 2016-07-21 MED ORDER — CYCLOBENZAPRINE HCL 10 MG PO TABS
10 MG | ORAL_TABLET | Freq: Three times a day (TID) | ORAL | 0 refills | Status: AC | PRN
Start: 2016-07-21 — End: 2016-07-31

## 2016-07-21 MED ORDER — ONDANSETRON HCL 4 MG/2ML IJ SOLN
4 MG/2ML | INTRAMUSCULAR | Status: DC | PRN
Start: 2016-07-21 — End: 2016-07-21
  Administered 2016-07-21: 20:00:00 4 mg via INTRAVENOUS

## 2016-07-21 MED ORDER — POTASSIUM CHLORIDE CRYS ER 10 MEQ PO TBCR
10 MEQ | ORAL_TABLET | Freq: Every day | ORAL | 0 refills | Status: DC
Start: 2016-07-21 — End: 2018-10-14

## 2016-07-21 MED ORDER — PROMETHAZINE HCL 25 MG PO TABS
25 MG | ORAL_TABLET | Freq: Four times a day (QID) | ORAL | 0 refills | Status: AC | PRN
Start: 2016-07-21 — End: 2016-07-28

## 2016-07-21 MED FILL — ONDANSETRON HCL 4 MG/2ML IJ SOLN: 4 MG/2ML | INTRAMUSCULAR | Qty: 2

## 2016-07-21 MED FILL — MORPHINE SULFATE (PF) 4 MG/ML IV SOLN: 4 mg/mL | INTRAVENOUS | Qty: 1

## 2016-07-21 MED FILL — ASPIRIN LOW DOSE 81 MG PO CHEW: 81 MG | ORAL | Qty: 4

## 2016-07-21 MED FILL — GABAPENTIN 400 MG PO CAPS: 400 MG | ORAL | Qty: 2

## 2016-07-21 NOTE — ED Notes (Signed)
Dc instructions given to patient with rx's, follow up appt, no other questions or concerns.     Nicanor Alconhris Dallis Darden, RN  07/21/16 [phone]

## 2016-07-21 NOTE — ED Notes (Signed)
Patient with c/o chest pain x 2 days, patient states that she had a heart attack [place] ago [place], patient states that she is out of her heart meds and bp meds for 3 weeks.     Nicanor Alconhris Tevita Gomer, RN  07/21/16 [id number]

## 2016-07-21 NOTE — ED Notes (Signed)
Carollee HerterShannon in lab aware of d dimer       Consuela MimesCara N Shalece Staffa, RN  07/21/16 1600

## 2016-07-21 NOTE — ED Provider Notes (Signed)
[facility] EMERGENCY DEPARTMENT  eMERGENCY dEPARTMENT eNCOUnter      Pt Name: Kristin Munoz  MRN: [id number]  Birthdate: [birth year]  Date of evaluation: 07/21/2016  Provider: Effie Berkshire, DO    CHIEF COMPLAINT       Chief Complaint   Patient presents with   ??? Chest Pain         HISTORY OF PRESENT ILLNESS  (Location/Symptom, Timing/Onset, Context/Setting, Quality, Duration, Modifying Factors, Severity.)   Kristin Munoz is a 35 y.o. female who presents to the emergency department For evaluation of retrosternal chest pain, pressure sensation on off her last couple days which is been worsening over the last few hours in addition to sharp chest pain rated and to the right shoulder and right arm.  No nausea or vomiting, no cough or congestion, no hemoptysis.  Patient doesn't make to significant medical history including some type of cardiac disease 3 or 4 weeks ago when she was in [place].  No catheterizations or stent placements she was told she had heart problem.  She also notes a significant medical history with ovarian cancer, blood clots, high blood pressure, kidney disease since she was young but has been waxing and waning with her symptoms, mainly treated in [place] over the last 8 years, was in [place] before then.  She denies any fevers or chills or recent illness, she does admit to decreased urinary output.  She denies any drugs of abuse, no smoking or alcohol abuse.  She is supposed to be on a few different medications but has since been out since returning to the area.  Has an appointment with her PCP coming up in about 1 week to establish care locally.  She denies any other acute systemic complaints at this time.       Nursing notes were reviewed.    REVIEW OF SYSTEMS    (2-9 systems for level 4, 10 or more for level 5)   ROS:  General:  No fevers, no chills, no weakness  Cardiovascular:  + chest pain, no palpitations  Respiratory:  No shortness of breath, no cough, no  wheezing  Gastrointestinal:  No pain, no nausea, no vomiting, no diarrhea  Musculoskeletal:  No muscle pain, no joint pain  Skin:  No rash, no easy bruising  Neurologic:  No speech problems, no headache, no extremity numbness, no extremity tingling, no extremity weakness  Psychiatric:  No anxiety  Genitourinary:  No dysuria, no hematuria    Except as noted above the remainder of the review of systems was reviewed and negative.       PAST MEDICAL HISTORY     Past Medical History:   Diagnosis Date   ??? Anemia    ??? Arthritis    ??? Asthma    ??? Atrial fibrillation (HCC)    ??? CAD (coronary artery disease)    ??? Cancer (HCC)     ovarian, bone, breast and colon   ??? Cardiac arrest [facility])     patient states that her heart stopped and she had to be revived by her daughter   ??? Cerebral artery occlusion with cerebral infarction [facility])    ??? CHF (congestive heart failure) (HCC)    ??? Chronic kidney disease    ??? COPD (chronic obstructive pulmonary disease) (HCC)    ??? Depression    ??? Diabetes mellitus (HCC)     diet control   ??? Headache    ??? Hypertension    ??? Hypokalemia    ???  Kidney stone    ??? Myocardial infarction 06/2016   ??? Pulmonary embolism (HCC)    ??? Thyroid disease          SURGICAL HISTORY       Past Surgical History:   Procedure Laterality Date   ??? APPENDECTOMY     ??? BACK SURGERY     ??? BREAST LUMPECTOMY Left    ??? BREAST SURGERY     ??? CHOLECYSTECTOMY     ??? HEMORRHOID SURGERY     ??? HYSTERECTOMY     ??? KIDNEY STONE SURGERY      x 72 per patient   ??? KNEE SURGERY Left    ??? LAPAROSCOPY      x36 per patient   ??? LUNG SURGERY      for pulmonary embolisms         CURRENT MEDICATIONS       Previous Medications    CLONAZEPAM (KLONOPIN) 0.5 MG TABLET    Take 0.5 mg by mouth 2 times daily as needed    CLONIDINE (CATAPRES) 0.1 MG TABLET    Take 0.1 mg by mouth 2 times daily    CYCLOBENZAPRINE (FLEXERIL) 10 MG TABLET    Take 10 mg by mouth 3 times daily as needed for Muscle spasms    DILTIAZEM (CARDIZEM CD) 360 MG EXTENDED RELEASE CAPSULE    Take  360 mg by mouth daily    DOXAZOSIN (CARDURA) 4 MG TABLET    Take 4 mg by mouth 2 times daily    FERROUS SULFATE 325 (65 FE) MG EC TABLET    Take 325 mg by mouth daily (with breakfast)    GABAPENTIN (NEURONTIN) 400 MG CAPSULE    Take 800 mg by mouth 4 times daily    HYDROXYZINE (ATARAX) 50 MG TABLET    Take 50 mg by mouth 2 times daily    IBUPROFEN (ADVIL;MOTRIN) 800 MG TABLET    Take 800 mg by mouth every 6 hours as needed for Pain    LEVOTHYROXINE (SYNTHROID) 125 MCG TABLET    Take 125 mcg by mouth Daily    LISINOPRIL (PRINIVIL;ZESTRIL) 40 MG TABLET    Take 40 mg by mouth daily    MAGNESIUM 500 MG CAPS    Take 1 each by mouth 2 times daily    METOPROLOL TARTRATE (LOPRESSOR) 50 MG TABLET    Take 50 mg by mouth 2 times daily    OMEPRAZOLE (PRILOSEC) 20 MG DELAYED RELEASE CAPSULE    Take 20 mg by mouth three times daily    ONDANSETRON (ZOFRAN-ODT) 8 MG DISINTEGRATING TABLET    Take 8 mg by mouth every 8 hours as needed for Nausea or Vomiting    OXYCODONE (OXY-IR) 15 MG IMMEDIATE RELEASE TABLET    Take 15 mg by mouth every 8 hours as needed for Pain .    POTASSIUM CHLORIDE (KLOR-CON) 20 MEQ PACKET    Take 20 mEq by mouth 3 times daily    PROMETHAZINE (PHENERGAN) 25 MG TABLET    Take 25 mg by mouth every 6 hours as needed for Nausea    SUMATRIPTAN (IMITREX) 100 MG TABLET    Take 100 mg by mouth once as needed for Migraine    TRAZODONE (DESYREL) 100 MG TABLET    Take 100 mg by mouth nightly       ALLERGIES     Pcn [penicillins]; Zanaflex [tizanidine hcl]; Pertussis vaccines; Polio virus vaccine live oral trivalent; Tetanus toxoids; and Levaquin [levofloxacin in  d5w]    FAMILY HISTORY     History reviewed. No pertinent family history.       SOCIAL HISTORY       Social History     Social History   ??? Marital status: Married     Spouse name: N/A   ??? Number of children: N/A   ??? Years of education: N/A     Social History Main Topics   ??? Smoking status: Never Smoker   ??? Smokeless tobacco: Never Used   ??? Alcohol use No   ??? Drug  use:      Types: Marijuana   ??? Sexual activity: Not Asked     Other Topics Concern   ??? None     Social History Narrative   ??? None         PHYSICAL EXAM    (up to 7 for level 4, 8 or more for level 5)     ED Triage Vitals   BP Temp Temp src Pulse Resp SpO2 Height Weight   -- -- -- -- -- -- -- --       Physical Exam  General :Patient is awake, alert, oriented, in no acute distress, nontoxic appearing  HEENT: Pupils are equally round and reactive to light, EOMI, conjunctivae clear, sclerae white, there is no injection no icterus.  Oral mucosa is moist, no exudate. Uvula is midline  Neck: Neck is supple, full range of motion, trachea midline  Cardiac: Heart regular rate, rhythm, There is a mild systolic ejection murmur with radiation to the bilateral axilla heard to the left second intercostal space.  Lungs: Lungs are clear to auscultation, there is no wheezing, rhonchi, or rales. There is no use of accessory muscles.  Abdomen: Abdomen is soft, nontender, nondistended. There is no firm or pulsatile masses, no rebound rigidity or guarding.   Musculoskeletal: 5 out of 5 strength in all 4 extremities.  No focal muscle deficits are appreciated  Neuro: Motor intact, sensory intact, level of consciousness is normal, GCS 15  Dermatology: Skin is warm and dry  Psych: Mentation is grossly normal, cognition is grossly normal. Affect is appropriate.      DIAGNOSTIC RESULTS     EKG: All EKG's are interpreted by the Emergency Department Physician who either signs or Co-signs this chart in the absence of a cardiologist.    EKG interpreted by me as normal sinus rhythm at 93 bpm, there is no acute ST elevation, T-wave flattening in aVL, 1.  Intervals within normal limits.  I do not have an old EKG available for comparison at this time.         RADIOLOGY:   Non-plain film images such as CT, Ultrasound and MRI are read by the radiologist. Plain radiographic images are visualized and preliminarily interpreted by the emergency physician  with the below findings:      []  Radiologist's Report Reviewed:  XR Chest Portable   Final Result      No acute pulmonary pathology                        ED BEDSIDE ULTRASOUND:   Performed by ED Physician - none    LABS:  Labs Reviewed   CBC WITH AUTO DIFFERENTIAL - Abnormal; Notable for the following:        Result Value    Hematocrit 36.7 (*)     MCV 78.3 (*)     MCH 25.2 (*)  MPV 10.4 (*)     Eosinophils # 0.5 (*)     All other components within normal limits    Narrative:     Performed at:  [facility]  [address]  [place], [place] [address]   Phone [phone]   COMPREHENSIVE METABOLIC PANEL - Abnormal; Notable for the following:     Glucose 108 (*)     Total Bilirubin <0.2 (*)     Alkaline Phosphatase 149 (*)     All other components within normal limits    Narrative:     Performed at:  [facility]  [address],  [place], [place] [address]   Phone [phone]   TROPONIN    Narrative:     Performed at:  [facility]  [address],  [place], [place] [address]   Phone [phone]   D-DIMER, QUANTITATIVE    Narrative:     Performed at:  [facility]  [address]  [place], [place] [address]   Phone [phone]   MAGNESIUM    Narrative:     Performed at:  [facility]  [address],  [place], [place] [address]   Phone [phone]       I have reviewed and interpreted all of the currently available lab results from this visit (if applicable):  Results for orders placed or performed during the hospital encounter of 07/21/16   CBC Auto Differential   Result Value Ref Range    WBC 9.7 4.0 - 11.0 K/uL    RBC 4.69 3.80 - 5.80 M/uL    Hemoglobin 11.8 11.5 - 16.5 g/dL    Hematocrit 43.3 (L) 37.0 - 47.0 %    MCV 78.3 (L) 80.0 - 100.0 fL    MCH 25.2 (L) 27.0 - 32.0 pg    MCHC 32.2 31.0 - 35.0  g/dL    RDW 29.5 18.8 - 41.6 %    Platelets 246 150 - 400 K/uL    MPV 10.4 (H) 6.0 - 10.0 fL    Neutrophils % 67.2 %    Lymphocytes % 22.4 20.0 - 40.0 %    Monocytes % 4.6 3.0 - 10.0 %    Eosinophils % 5.5 %    Basophils % 0.3 %    Neutrophils # 6.5 2.0 - 7.5 K/uL    Lymphocytes # 2.2 1.5 - 4.0 K/uL    Monocytes # 0.5 0.2 - 0.8 K/uL    Eosinophils # 0.5 (H) 0.0 - 0.4 K/uL    Basophils # 0.0 0.0 - 0.1 K/uL   Comprehensive Metabolic Panel   Result Value Ref Range    Sodium 140 136 - 145 mmol/L    Potassium 4.1 3.4 - 5.1 mmol/L    Chloride 103 98 - 107 mmol/L    CO2 24 20 - 30 mmol/L    Anion Gap 13 3 - 16    Glucose 108 (H) 74 - 106 mg/dL    BUN 10 6 - 20 mg/dL    CREATININE 0.9 0.4 - 1.2 mg/dL    GFR Non-African American >60 >59    GFR African American >59 >59    Calcium 9.4 8.5 - 10.5 mg/dL    Total Protein 7.2 6.4 - 8.3 g/dL    Alb 3.9 3.4 -  4.8 g/dL    Albumin/Globulin Ratio 1.2 0.8 - 2.0    Total Bilirubin <0.2 (L) 0.3 - 1.2 mg/dL    Alkaline Phosphatase 149 (H) 25 - 100 U/L    ALT 12 4 - 36 U/L    AST 15 8 - 33 U/L    Globulin 3.3 g/dL   Troponin   Result Value Ref Range    Troponin <0.30 <0.30 ng/mL   D-Dimer, Quantitative   Result Value Ref Range    D-Dimer, Quant <100 <600 ng/mL DDU   Magnesium   Result Value Ref Range    Magnesium 1.7 1.7 - 2.4 mg/dL        All other labs were within normal range or not returned as of this dictation.    EMERGENCY DEPARTMENT COURSE and DIFFERENTIAL DIAGNOSIS/MDM:   Vitals:    Vitals:    07/21/16 1600 07/21/16 1615 07/21/16 1630 07/21/16 1645   BP: (!) 156/94 (!) 142/91 136/86 (!) 142/92   Pulse: 91 87 90 85   Resp:       Temp:       TempSrc:       SpO2: 100% 99% 100% 100%   Weight:       Height:           Patient with extensive medical history given her age with chest pain on and off for last couple days, pressure sensation as well as sharp in sensation, radiating to the right side of her chest and arm.  She does not appear acutely ill or toxic, vital signs are stable with an  elevated blood pressure upon arrival.  EKG without acute ischemic changes.  Given her history we did obtain IV, labs, d-dimer as well as imaging.  Patient given full dose aspirin, pain and nausea control.  Results reviewed as above, no significant abnormality noted.  I updated the patient on these results.  We discussed repeat troponin 3 hours for reevaluation to lower her risk.  The patient notes she does not want to stay for that long, she would like to go home, follow up with her PCP and referral to cardiologist.  She understands the risks of leaving without full workup at this time, understands if she has any recurrent chest pain she should return to the ED for reevaluation.  We discussed starting her on some of her long-term medications which are not narcotic in nature. The patient will follow-up with their PCP in 1-2 days for reevaluation.  If the patient or family members have any further concerns or any worsening symptoms they will return to the ED for reevaluation.         CONSULTS:  None    PROCEDURES:  Procedures    CRITICAL CARE TIME    Total Critical Care time was 0 minutes, excluding separately reportable procedures.   There was a high probability of clinically significant/life threatening deterioration in the patient's condition which required my urgent intervention.      FINAL IMPRESSION      1. Chest pain, unspecified type          DISPOSITION/PLAN   DISPOSITION     PATIENT REFERRED TO:  Colon Branch or one of our clinic physicians    In 2 days      Oneida Alar, MD  [address]  [address]  [place] [address]  [phone]    Schedule an appointment as soon as possible for a visit   Cardiologist    [facility]  Emergency Department  [address]  [place] [address]  [phone]    If symptoms worsen      DISCHARGE MEDICATIONS:  New Prescriptions    CLONIDINE (CATAPRES) 0.1 MG TABLET    Take 0.5 tablets by mouth 2 times daily    CYCLOBENZAPRINE (FLEXERIL) 10 MG TABLET    Take 1 tablet by  mouth 3 times daily as needed for Muscle spasms    DILTIAZEM (CARDIZEM CD) 360 MG EXTENDED RELEASE CAPSULE    Take 1 capsule by mouth daily    DOXAZOSIN (CARDURA) 4 MG TABLET    Take 1 tablet by mouth nightly    OMEPRAZOLE (PRILOSEC) 20 MG DELAYED RELEASE CAPSULE    Take 1 capsule by mouth daily    POTASSIUM CHLORIDE (KLOR-CON M) 10 MEQ EXTENDED RELEASE TABLET    Take 1 tablet by mouth daily    PROMETHAZINE (PHENERGAN) 25 MG TABLET    Take 1 tablet by mouth every 6 hours as needed for Nausea       Comment: Please note this report has been produced using speech recognition software and may contain errors related to that system including errors in grammar, punctuation, and spelling, as well as words and phrases that may be inappropriate. If there are any questions or concerns please feel free to contact the dictating provider for clarification.    Effie Berkshire, DO  Attending Emergency Physician              Effie Berkshire, DO  07/21/16 1712

## 2018-02-12 ENCOUNTER — Encounter (HOSPITAL_COMMUNITY): Payer: Self-pay | Admitting: Emergency Medicine

## 2018-02-12 ENCOUNTER — Emergency Department (HOSPITAL_COMMUNITY)
Admission: EM | Admit: 2018-02-12 | Discharge: 2018-02-13 | Disposition: A | Discharge status: Home / Self Care | Payer: Medicaid Other | Attending: Emergency Medicine | Admitting: Emergency Medicine

## 2018-02-12 DIAGNOSIS — F1994 Other psychoactive substance use, unspecified with psychoactive substance-induced mood disorder: Secondary | ICD-10-CM | POA: Diagnosis not present

## 2018-02-12 DIAGNOSIS — F1490 Cocaine use, unspecified, uncomplicated: Secondary | ICD-10-CM | POA: Diagnosis not present

## 2018-02-12 DIAGNOSIS — R45851 Suicidal ideations: Secondary | ICD-10-CM | POA: Insufficient documentation

## 2018-02-12 DIAGNOSIS — F329 Major depressive disorder, single episode, unspecified: Secondary | ICD-10-CM | POA: Diagnosis not present

## 2018-02-12 DIAGNOSIS — R451 Restlessness and agitation: Secondary | ICD-10-CM | POA: Diagnosis present

## 2018-02-12 DIAGNOSIS — F1290 Cannabis use, unspecified, uncomplicated: Secondary | ICD-10-CM | POA: Diagnosis not present

## 2018-02-12 DIAGNOSIS — F1590 Other stimulant use, unspecified, uncomplicated: Secondary | ICD-10-CM | POA: Diagnosis not present

## 2018-02-12 LAB — COMPREHENSIVE METABOLIC PANEL
ALK PHOS: 96 U/L (ref 38–126)
ALT: 18 U/L (ref 14–54)
ANION GAP: 8 (ref 5–15)
AST: 22 U/L (ref 15–41)
Albumin: 3.6 g/dL (ref 3.5–5.0)
BILIRUBIN TOTAL: 0.3 mg/dL (ref 0.3–1.2)
BUN: 16 mg/dL (ref 6–20)
CALCIUM: 8.9 mg/dL (ref 8.9–10.3)
CO2: 22 mmol/L (ref 22–32)
CREATININE: 0.97 mg/dL (ref 0.44–1.00)
Chloride: 108 mmol/L (ref 101–111)
GFR calc non Af Amer: >60 mL/min (ref >60)
Glucose, Bld: 80 mg/dL (ref 65–99)
Potassium: 3.5 mmol/L (ref 3.5–5.1)
SODIUM: 138 mmol/L (ref 135–145)
TOTAL PROTEIN: 6.5 g/dL (ref 6.5–8.1)

## 2018-02-12 LAB — RAPID URINE DRUG SCREEN, HOSP PERFORMED
AMPHETAMINES: POSITIVE — AB
BARBITURATES: NOT DETECTED
BENZODIAZEPINES: NOT DETECTED
Cocaine: POSITIVE — AB
Opiates: NOT DETECTED
TETRAHYDROCANNABINOL: POSITIVE — AB

## 2018-02-12 LAB — CBC
HEMATOCRIT: 39.7 % (ref 36.0–46.0)
HEMOGLOBIN: 12.5 g/dL (ref 12.0–15.0)
MCH: 26.1 pg (ref 26.0–34.0)
MCHC: 31.5 g/dL (ref 30.0–36.0)
MCV: 82.9 fL (ref 78.0–100.0)
PLATELETS: 203 10*3/uL (ref 150–400)
RBC: 4.79 MIL/uL (ref 3.87–5.11)
RDW: 13.8 % (ref 11.5–15.5)
WBC: 6.3 10*3/uL (ref 4.0–10.5)

## 2018-02-12 LAB — ACETAMINOPHEN LEVEL: Acetaminophen (Tylenol), Serum: <10 ug/mL — ABNORMAL LOW (ref 10–30)

## 2018-02-12 LAB — SALICYLATE LEVEL

## 2018-02-12 LAB — ETHANOL: Alcohol, Ethyl (B): <10 mg/dL (ref <10)

## 2018-02-12 MED ORDER — ACETAMINOPHEN 325 MG PO TABS
650.0000 mg | ORAL_TABLET | Freq: Three times a day (TID) | ORAL | Status: AC | PRN
  Filled 2018-02-12: qty 2

## 2018-02-12 MED ORDER — DILTIAZEM HCL ER COATED BEADS 180 MG PO CP24
360.0000 mg | ORAL_CAPSULE | Freq: Every day | ORAL | Status: DC
  Administered 2018-02-12: 360 mg via ORAL
  Filled 2018-02-12 (×2): qty 2

## 2018-02-12 MED ORDER — OLANZAPINE 10 MG PO TABS
10.0000 mg | ORAL_TABLET | Freq: Two times a day (BID) | ORAL | Status: DC
  Administered 2018-02-12 – 2018-02-13 (×2): 10 mg via ORAL
  Filled 2018-02-12 (×3): qty 1

## 2018-02-12 MED ORDER — FERROUS GLUCONATE 324 (38 FE) MG PO TABS
324.0000 mg | ORAL_TABLET | Freq: Two times a day (BID) | ORAL | Status: DC
  Administered 2018-02-12 – 2018-02-13 (×2): 324 mg via ORAL
  Filled 2018-02-12 (×3): qty 1

## 2018-02-12 MED ORDER — GABAPENTIN 300 MG PO CAPS
300.0000 mg | ORAL_CAPSULE | Freq: Three times a day (TID) | ORAL | Status: DC
  Administered 2018-02-12 – 2018-02-13 (×2): 300 mg via ORAL
  Filled 2018-02-12 (×3): qty 1

## 2018-02-12 MED ORDER — METOPROLOL SUCCINATE ER 50 MG PO TB24
50.0000 mg | ORAL_TABLET | Freq: Every day | ORAL | Status: DC
  Administered 2018-02-12: 50 mg via ORAL
  Filled 2018-02-12 (×2): qty 1

## 2018-02-12 MED ORDER — TRAZODONE HCL 100 MG PO TABS
100.0000 mg | ORAL_TABLET | Freq: Every day | ORAL | Status: DC
  Filled 2018-02-12: qty 1

## 2018-02-12 MED ORDER — LEVOTHYROXINE SODIUM 125 MCG PO TABS
125.0000 ug | ORAL_TABLET | Freq: Every day | ORAL | Status: DC
  Administered 2018-02-13: 125 ug via ORAL
  Filled 2018-02-12: qty 1

## 2018-02-12 MED ORDER — ACETAMINOPHEN 325 MG PO TABS: 650.0000 mg | ORAL_TABLET | Freq: Once | ORAL | Status: DC

## 2018-02-12 MED ORDER — CLONIDINE HCL 0.1 MG PO TABS
0.1000 mg | ORAL_TABLET | Freq: Two times a day (BID) | ORAL | Status: DC
  Administered 2018-02-12: 0.1 mg via ORAL
  Filled 2018-02-12: qty 1
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
## 2018-02-12 NOTE — ED Notes (Signed)
Bed: WLPT3 Expected date:  Expected time:  Means of arrival:  Comments: 

## 2018-02-12 NOTE — ED Triage Notes (Signed)
Patient here from home IVC'd brought in by [facility]. Hx of bipolar disorder. Off meds. Told husband she wants to hurt self and has been hostile. Denies SI/HI currently.
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
## 2018-02-12 NOTE — ED Notes (Signed)
MD notified of pt.'s latest V/S. Pt. Resting ,asymtomatic.
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
## 2018-02-13 DIAGNOSIS — F1290 Cannabis use, unspecified, uncomplicated: Secondary | ICD-10-CM

## 2018-02-13 DIAGNOSIS — F17210 Nicotine dependence, cigarettes, uncomplicated: Secondary | ICD-10-CM | POA: Diagnosis not present

## 2018-02-13 DIAGNOSIS — F1490 Cocaine use, unspecified, uncomplicated: Secondary | ICD-10-CM | POA: Diagnosis not present

## 2018-02-13 DIAGNOSIS — F1994 Other psychoactive substance use, unspecified with psychoactive substance-induced mood disorder: Secondary | ICD-10-CM

## 2018-02-13 DIAGNOSIS — F1590 Other stimulant use, unspecified, uncomplicated: Secondary | ICD-10-CM | POA: Diagnosis not present

## 2018-02-13 MED ORDER — SUMATRIPTAN SUCCINATE 50 MG PO TABS
100.0000 mg | ORAL_TABLET | ORAL | Status: DC | PRN
  Administered 2018-02-13: 100 mg via ORAL
  Filled 2018-02-13: qty 2

## 2018-02-13 NOTE — Progress Notes (Signed)
CSW provided patient with SCAT application for assistance with transportation.   Kingsley Spittle, [facility] Emergency Room Clinical Social Worker [phone]
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
## 2018-02-13 NOTE — Patient Outreach (Signed)
[facility] met with the patient and provided substance use recovery support. Patient states that she is not interested in help with substance use recovery support or help with substance use recovery resources. CPSS still provided [facility]PSS contact information. CPSS strongly encouraged the patient contact [facility] if she becomes interested in substance use recovery resources, so [facility]PSS can help her get connected with recovery resources or for further substance use recovery support.

## 2018-02-13 NOTE — Discharge Instructions (Signed)
For your behavioral health needs, you are advised to follow up with [facility]:       [facility]      [address] [place]      [place], [place] [address]      [phone]
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
## 2018-02-13 NOTE — BH Assessment (Signed)
[place] Assessment Progress Note  Per Ambrose Finland, MD, this pt does not require psychiatric hospitalization at this time.  Pt presents under IVC initiated by pt's husband, which Dr Louretta Shorten has rescinded.  Pt is to be discharged from [facility] with recommendation to follow up with [facility].  This has been included in pt's discharge instructions.  Pt's nurse, Loma Sousa, has been notified.  Jalene Mullet, [place] Triage Specialist [phone]

## 2018-07-24 IMAGING — CR DG CHEST 2V
2 series · 2 of 2 positions shown · non-contrast
Comparison: Chest and CT chest 01/28/2006

CLINICAL DATA: Cough and Mid chest pain for several weeks. Fever.
History of recent pneumonia. Finished antibiotics 1 week ago.

EXAM:
CHEST  2 VIEW

[w chest pa]
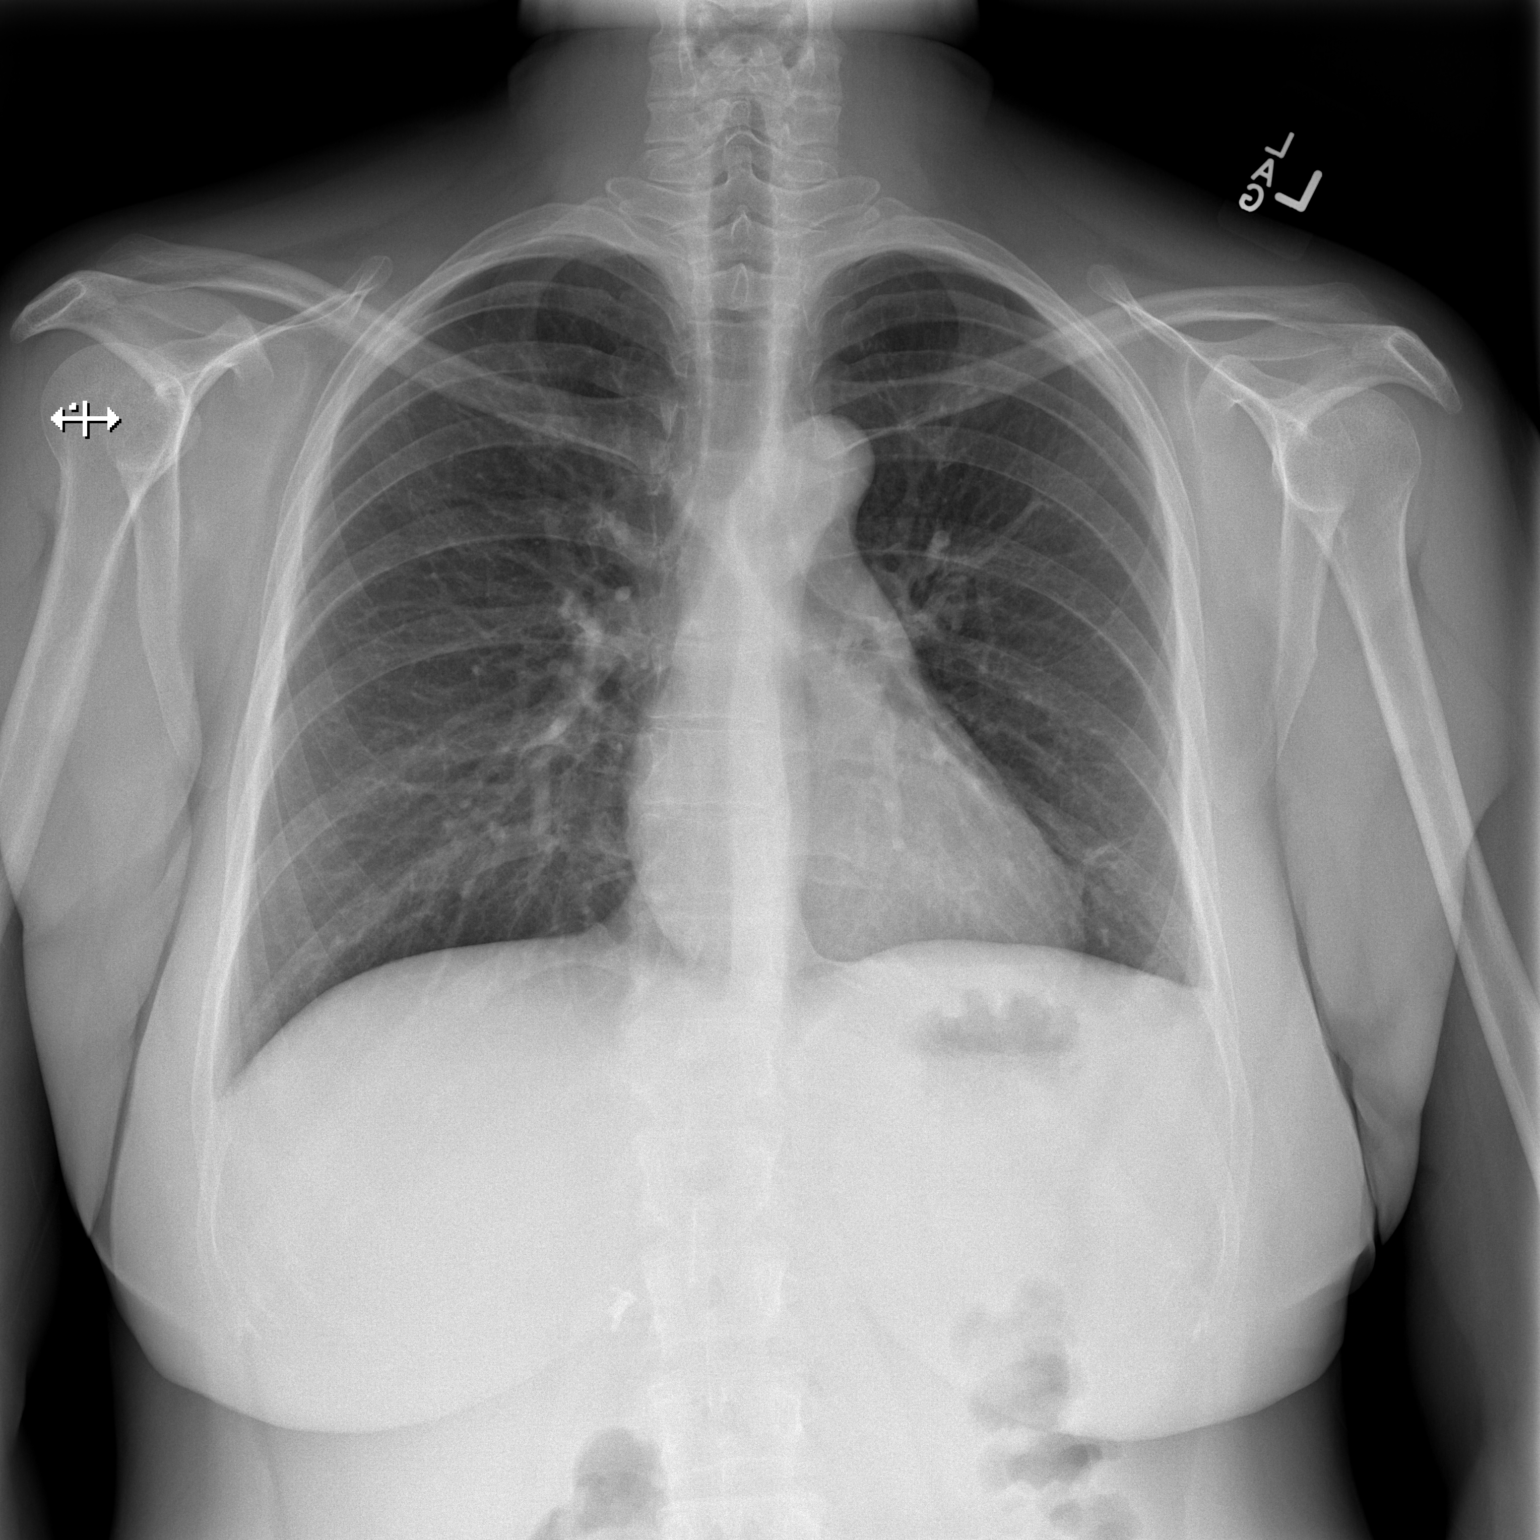

[w chest lat]
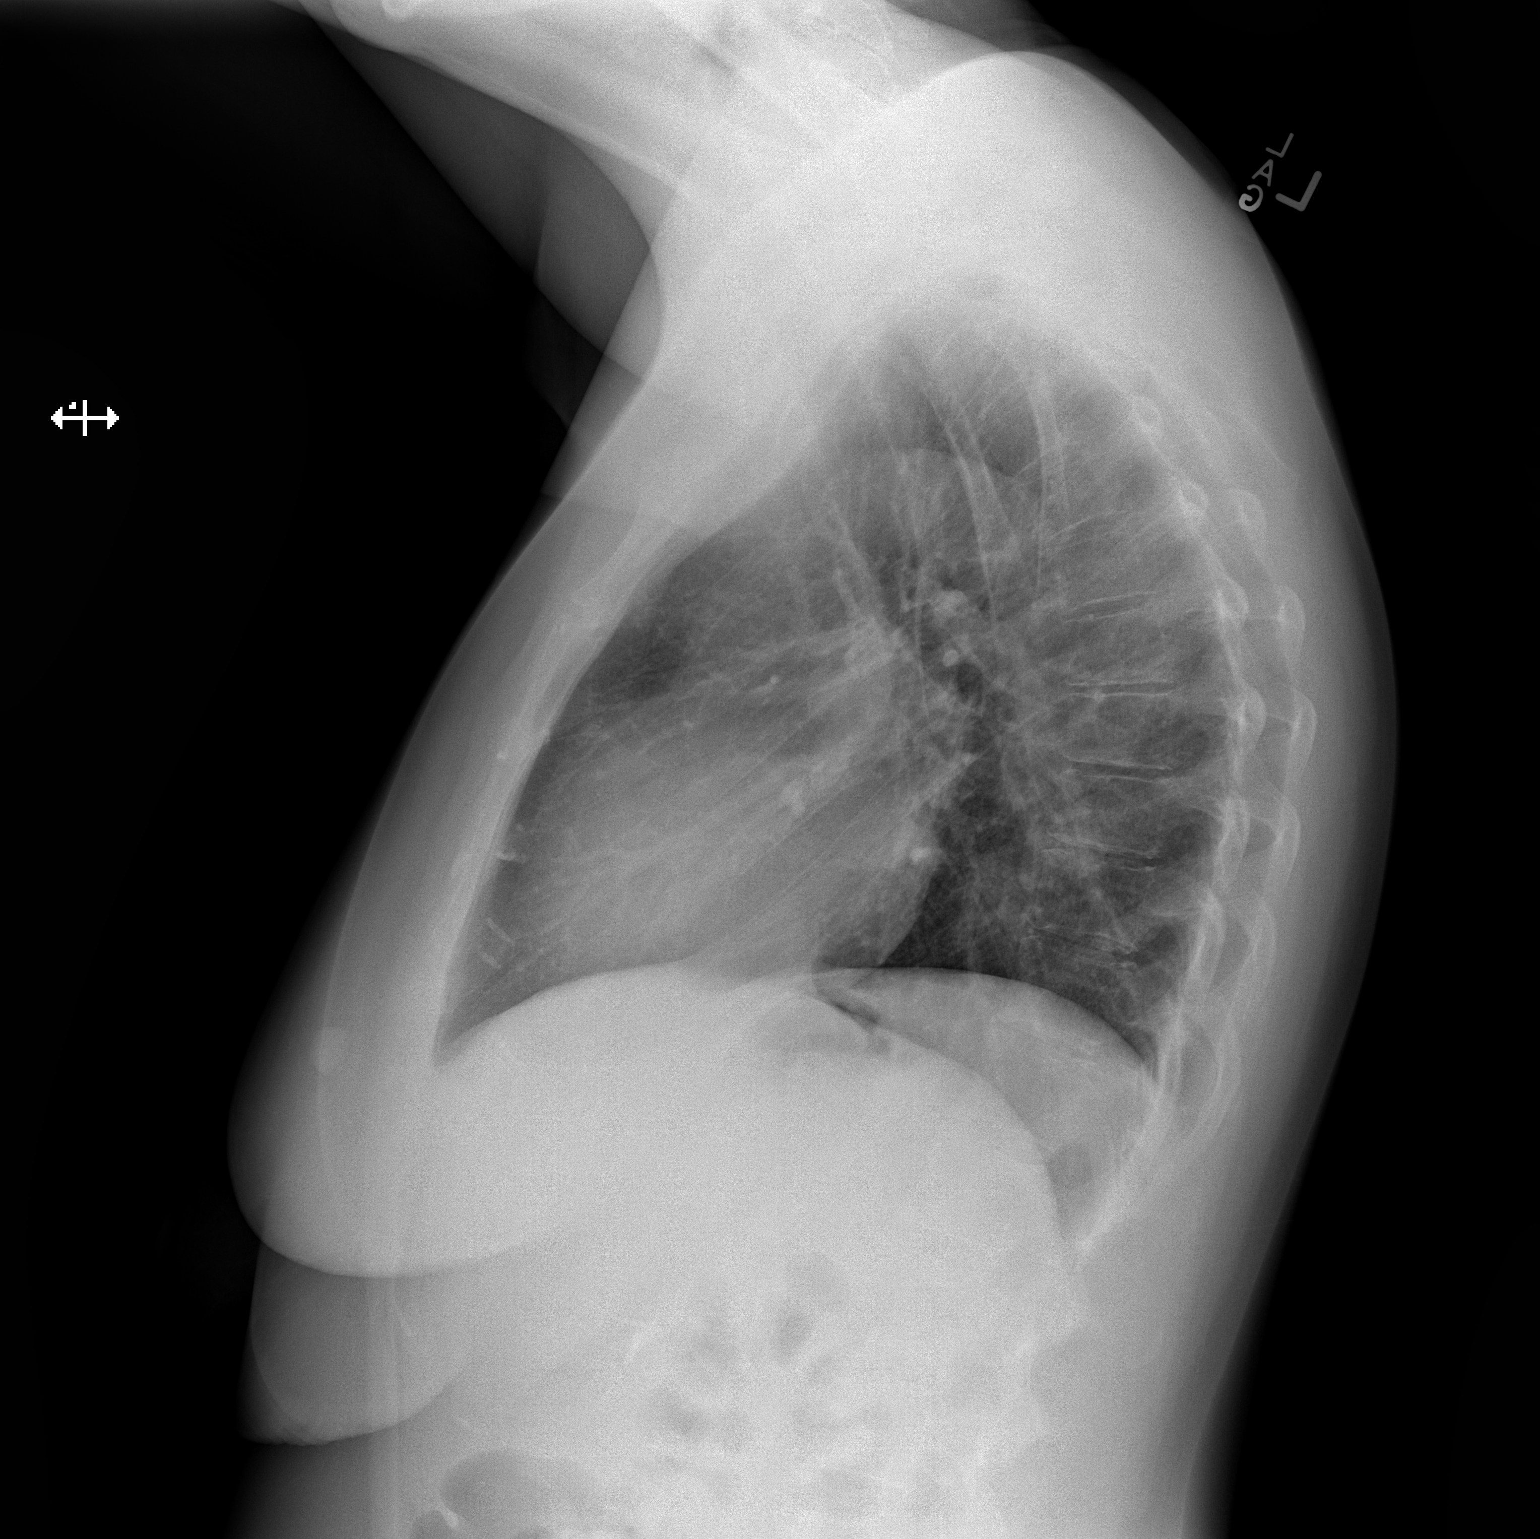

[2 of 2 positions shown; findings below may reference images not displayed]

FINDINGS: Normal heart size and pulmonary vascularity. No focal airspace
disease or consolidation in the lungs. No blunting of costophrenic
angles. No pneumothorax. Mediastinal contours appear intact. Slight
fibrosis in the left lung base. Surgical clips in the right upper
quadrant.
IMPRESSION: No active cardiopulmonary disease.

## 2018-10-14 ENCOUNTER — Emergency Department: Admit: 2018-10-14 | Payer: MEDICAID | Primary: Family

## 2018-10-14 ENCOUNTER — Inpatient Hospital Stay
Admit: 2018-10-14 | Discharge: 2018-10-14 | Disposition: A | Discharge status: Home / Self Care | Payer: MEDICAID | Attending: Emergency Medicine

## 2018-10-14 DIAGNOSIS — R109 Unspecified abdominal pain: Secondary | ICD-10-CM

## 2018-10-14 LAB — AMYLASE: Amylase: 90 U/L (ref 20–104)

## 2018-10-14 LAB — URINALYSIS WITH REFLEX TO CULTURE
Bilirubin Urine: NEGATIVE
Blood, Urine: NEGATIVE
Glucose, Ur: NEGATIVE mg/dL
Ketones, Urine: NEGATIVE mg/dL
Leukocyte Esterase, Urine: NEGATIVE
Nitrite, Urine: NEGATIVE
Protein, UA: NEGATIVE mg/dL
Specific Gravity, UA: 1.02 (ref 1.005–1.030)
Urobilinogen, Urine: 0.2 E.U./dL (ref <2.0)
pH, UA: 7 (ref 5.0–8.0)

## 2018-10-14 LAB — COMPREHENSIVE METABOLIC PANEL
ALT: 14 U/L (ref 4–36)
AST: 18 U/L (ref 8–33)
Albumin/Globulin Ratio: 1.4 (ref 0.8–2.0)
Albumin: 4.8 g/dL (ref 3.4–4.8)
Alkaline Phosphatase: 126 U/L — ABNORMAL HIGH (ref 25–100)
Anion Gap: 11 (ref 3–16)
BUN: 12 mg/dL (ref 6–20)
CO2: 28 mmol/L (ref 20–30)
Calcium: 10.3 mg/dL (ref 8.5–10.5)
Chloride: 99 mmol/L (ref 98–107)
Creatinine: 0.8 mg/dL (ref 0.4–1.2)
GFR African American: >59 (ref >59)
GFR Non-African American: >60 (ref >59)
Globulin: 3.5 g/dL
Glucose: 94 mg/dL (ref 74–106)
Potassium: 3.6 mmol/L (ref 3.4–5.1)
Sodium: 138 mmol/L (ref 136–145)
Total Bilirubin: 0.3 mg/dL (ref 0.3–1.2)
Total Protein: 8.3 g/dL (ref 6.4–8.3)

## 2018-10-14 LAB — CBC WITH AUTO DIFFERENTIAL
Basophils %: 0.4 %
Basophils Absolute: 0 10*3/uL (ref 0.0–0.1)
Eosinophils %: 1.7 %
Eosinophils Absolute: 0.1 10*3/uL (ref 0.0–0.4)
Hematocrit: 44.7 % (ref 37.0–47.0)
Hemoglobin: 13.8 g/dL (ref 11.5–16.5)
Immature Granulocytes #: 0 10*3/uL
Immature Granulocytes %: 0.2 % (ref 0.0–5.0)
Lymphocytes %: 45.6 %
Lymphocytes Absolute: 2.4 10*3/uL (ref 1.5–4.0)
MCH: 25 pg — ABNORMAL LOW (ref 27.0–32.0)
MCHC: 30.9 g/dL — ABNORMAL LOW (ref 31.0–35.0)
MCV: 80.8 fL (ref 80.0–100.0)
MPV: 10 fL (ref 6.0–10.0)
Monocytes %: 5.1 %
Monocytes Absolute: 0.3 10*3/uL (ref 0.2–0.8)
Neutrophils %: 47 %
Neutrophils Absolute: 2.5 10*3/uL (ref 2.0–7.5)
Platelets: 175 10*3/uL (ref 150–400)
RBC: 5.53 M/uL (ref 3.80–5.80)
RDW: 13.4 % (ref 11.0–16.0)
WBC: 5.3 10*3/uL (ref 4.0–11.0)

## 2018-10-14 LAB — PROTIME-INR
INR: 1 (ref 0.90–1.10)
Protime: 9.4 s (ref 8.9–10.7)

## 2018-10-14 LAB — LIPASE: Lipase: 40 U/L (ref 5.6–51.3)

## 2018-10-14 MED ORDER — HYDROCODONE-ACETAMINOPHEN 5-325 MG PO TABS
5-325 MG | ORAL_TABLET | Freq: Three times a day (TID) | ORAL | 0 refills | Status: AC | PRN
Start: 2018-10-14 — End: 2018-10-16

## 2018-10-14 NOTE — ED Notes (Signed)
Reviewed discharge plan with Kristin Munoz.  Encouraged her to f/u with Kristin Freer, MD and she understood.  NAD noted on discharge, gait steady.      Current Discharge Medication List           Details   HYDROcodone-acetaminophen (NORCO) 5-325 MG per tablet Take 1 tablet by mouth every 8 hours as needed for Pain for up to 2 days. Intended supply: 3 days. Take lowest dose possible to manage pain  Qty: 6 tablet, Refills: 0    Associated Diagnoses: Flank pain              Details   diltiazem (CARDIZEM CD) 360 MG extended release capsule Take 360 mg by mouth daily      cloNIDine (CATAPRES) 0.1 MG tablet Take 0.1 mg by mouth 2 times daily      potassium chloride (KLOR-CON) 20 MEQ packet Take 40 mEq by mouth 3 times daily       Magnesium 500 MG CAPS Take 1 each by mouth 2 times daily      ferrous sulfate 325 (65 Fe) MG EC tablet Take 325 mg by mouth daily (with breakfast)      omeprazole (PRILOSEC) 20 MG delayed release capsule Take 20 mg by mouth three times daily      traZODone (DESYREL) 100 MG tablet Take 100 mg by mouth nightly      cyclobenzaprine (FLEXERIL) 10 MG tablet Take 10 mg by mouth 3 times daily as needed for Muscle spasms      clonazePAM (KLONOPIN) 0.5 MG tablet Take 0.5 mg by mouth 2 times daily as needed      gabapentin (NEURONTIN) 400 MG capsule Take 800 mg by mouth 4 times daily      SUMAtriptan (IMITREX) 100 MG tablet Take 100 mg by mouth once as needed for Migraine      ibuprofen (ADVIL;MOTRIN) 200 MG CAPS Take 800 mg by mouth every 6 hours as needed for Pain       metoprolol tartrate (LOPRESSOR) 50 MG tablet Take 50 mg by mouth 2 times daily      promethazine (PHENERGAN) 25 MG tablet Take 25 mg by mouth every 6 hours as needed for Nausea      ondansetron (ZOFRAN-ODT) 8 MG disintegrating tablet Take 8 mg by mouth every 8 hours as needed for Nausea or Vomiting      levothyroxine (SYNTHROID) 125 MCG tablet Take 125 mcg by mouth Daily      lisinopril (PRINIVIL;ZESTRIL) 40 MG tablet Take 40 mg  by mouth daily             Kristin Munoz states understanding of how and when to take medications.    Vitals:    10/14/18 1421   BP: (!) 155/95   Pulse: 106   Resp: 18   Temp: 98.5 F (36.9 C)   SpO2: 100%       Electronically signed by Jeanann Lewandowsky, RN on 10/14/2018 at [address] [place], RN  10/14/18 1525

## 2018-10-14 NOTE — ED Notes (Signed)
Patient states is having bilateral side pain, patient states that she has liver failure and renal failure. Patient states that she just moved back from [place].     Nicanor Alcon, RN  10/14/18 [phone]
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
## 2018-10-25 ENCOUNTER — Ambulatory Visit: Admit: 2018-10-25 | Discharge: 2018-10-25 | Payer: MEDICAID | Attending: Family | Primary: Family

## 2018-10-25 DIAGNOSIS — R319 Hematuria, unspecified: Secondary | ICD-10-CM

## 2018-10-25 MED ORDER — ALBUTEROL SULFATE HFA 108 (90 BASE) MCG/ACT IN AERS
108 (90 Base) MCG/ACT | RESPIRATORY_TRACT | 5 refills | Status: DC | PRN
Start: 2018-10-25 — End: 2019-07-09

## 2018-10-25 MED ORDER — PROMETHAZINE HCL 25 MG PO TABS
25 MG | ORAL_TABLET | Freq: Three times a day (TID) | ORAL | 5 refills | Status: DC | PRN
Start: 2018-10-25 — End: 2019-02-28

## 2018-10-25 MED ORDER — IBUPROFEN 800 MG PO TABS
800 MG | ORAL_TABLET | Freq: Three times a day (TID) | ORAL | 5 refills | Status: DC
Start: 2018-10-25 — End: 2019-02-28

## 2018-10-25 MED ORDER — LEVOTHYROXINE SODIUM 125 MCG PO TABS
125 MCG | ORAL_TABLET | Freq: Every day | ORAL | 5 refills | Status: DC
Start: 2018-10-25 — End: 2019-02-28

## 2018-10-25 MED ORDER — FERROUS GLUCONATE 324 (38 FE) MG PO TABS
324 (38 Fe) MG | ORAL_TABLET | Freq: Every day | ORAL | 5 refills | Status: DC
Start: 2018-10-25 — End: 2019-02-28

## 2018-10-25 MED ORDER — MAGNESIUM OXIDE 400 MG PO TABS
400 MG | ORAL_TABLET | Freq: Two times a day (BID) | ORAL | 5 refills | Status: DC
Start: 2018-10-25 — End: 2019-02-28

## 2018-10-25 MED ORDER — NITROGLYCERIN 0.4 MG SL SUBL
0.4 MG | ORAL_TABLET | SUBLINGUAL | 5 refills | Status: DC | PRN
Start: 2018-10-25 — End: 2019-06-06

## 2018-10-25 MED ORDER — TRAZODONE HCL 100 MG PO TABS
100 MG | ORAL_TABLET | Freq: Every evening | ORAL | 5 refills | Status: DC
Start: 2018-10-25 — End: 2019-02-28

## 2018-10-25 MED ORDER — AZITHROMYCIN 250 MG PO TABS
250 MG | ORAL_TABLET | ORAL | 0 refills | Status: AC
Start: 2018-10-25 — End: 2018-11-04

## 2018-10-25 MED ORDER — LISINOPRIL 40 MG PO TABS
40 MG | ORAL_TABLET | Freq: Every day | ORAL | 5 refills | Status: DC
Start: 2018-10-25 — End: 2019-02-28

## 2018-10-25 MED ORDER — DILTIAZEM HCL ER COATED BEADS 360 MG PO CP24
360 MG | ORAL_CAPSULE | Freq: Every day | ORAL | 5 refills | Status: DC
Start: 2018-10-25 — End: 2019-05-01

## 2018-10-25 MED ORDER — FLUTICASONE PROPIONATE 50 MCG/ACT NA SUSP
50 MCG/ACT | Freq: Every day | NASAL | 5 refills | Status: DC
Start: 2018-10-25 — End: 2019-02-28

## 2018-10-25 MED ORDER — METOPROLOL SUCCINATE ER 50 MG PO TB24
50 MG | ORAL_TABLET | Freq: Every day | ORAL | 3 refills | Status: DC
Start: 2018-10-25 — End: 2019-02-28

## 2018-10-25 MED ORDER — EPINEPHRINE 0.3 MG/0.3ML IJ SOAJ
0.3 | INTRAMUSCULAR | 2 refills | 30.00000 days | Status: DC | PRN
Start: 2018-10-25 — End: 2020-01-19

## 2018-10-25 MED ORDER — DICLOFENAC SODIUM 1 % TD GEL
1 % | Freq: Four times a day (QID) | TRANSDERMAL | 5 refills | Status: DC | PRN
Start: 2018-10-25 — End: 2019-02-03

## 2018-10-25 MED ORDER — CLONIDINE HCL 0.1 MG PO TABS
0.1 MG | ORAL_TABLET | Freq: Two times a day (BID) | ORAL | 5 refills | Status: DC
Start: 2018-10-25 — End: 2019-05-01

## 2018-10-25 MED ORDER — POTASSIUM CHLORIDE CRYS ER 20 MEQ PO TBCR
20 MEQ | ORAL_TABLET | Freq: Three times a day (TID) | ORAL | 5 refills | Status: DC
Start: 2018-10-25 — End: 2019-06-06

## 2018-10-25 MED ORDER — CYCLOBENZAPRINE HCL 10 MG PO TABS
10 MG | ORAL_TABLET | Freq: Three times a day (TID) | ORAL | 5 refills | Status: DC | PRN
Start: 2018-10-25 — End: 2019-02-28

## 2018-10-25 MED ORDER — SUMATRIPTAN SUCCINATE 100 MG PO TABS
100 MG | ORAL_TABLET | Freq: Once | ORAL | 5 refills | Status: DC | PRN
Start: 2018-10-25 — End: 2019-02-28

## 2018-10-25 NOTE — Progress Notes (Signed)
Chief Complaint   Patient presents with   ??? Pharyngitis     Patient here today for a sore throat, and coughing up blood, she states she has also had a migraine. She states this has been going on for about a week. She is a former patient of Dr. Oren Bracket in [place].    ??? Cough   ??? Migraine     Have you seen any other physician or provider since your last visit? yes - Dr. Sharen Counter [place]    Have you had any other diagnostic tests since your last visit? no    Have you changed or stopped any medications since your last visit including any over-the-counter medicines, vitamins, or herbal medicines? no     Are you taking all your prescribed medications? Yes  If NO, why? -  N/A    I have recommended that this patient have a flu shot but she declines at this time. I have discussed the risks and benefits of this examination with her. The patient verbalizes understanding.        REVIEW OF SYSTEMS:  Review of Systems   Constitutional: Negative for chills and fever.   HENT: Positive for sore throat. Negative for ear pain.    Eyes: Negative for pain and visual disturbance.   Respiratory: Positive for cough. Negative for shortness of breath.    Cardiovascular: Negative for chest pain, palpitations and leg swelling.   Gastrointestinal: Negative for abdominal pain, nausea and vomiting.   Genitourinary: Negative for dysuria and hematuria.   Musculoskeletal: Negative for joint swelling.   Skin: Negative for rash.   Neurological: Positive for dizziness. Negative for weakness.        States she got dizzy and passed out at home   Psychiatric/Behavioral: Negative for sleep disturbance.

## 2018-11-01 ENCOUNTER — Ambulatory Visit: Payer: MEDICAID | Primary: Family
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
## 2018-11-08 ENCOUNTER — Encounter

## 2018-11-08 ENCOUNTER — Inpatient Hospital Stay: Admit: 2018-11-08 | Payer: MEDICAID | Primary: Family

## 2018-11-08 DIAGNOSIS — R928 Other abnormal and inconclusive findings on diagnostic imaging of breast: Secondary | ICD-10-CM

## 2018-11-08 DIAGNOSIS — Z853 Personal history of malignant neoplasm of breast: Secondary | ICD-10-CM

## 2018-11-14 ENCOUNTER — Emergency Department: Admit: 2018-11-14 | Payer: MEDICAID | Primary: Family

## 2018-11-14 ENCOUNTER — Inpatient Hospital Stay
Admit: 2018-11-14 | Discharge: 2018-11-14 | Disposition: A | Discharge status: Home / Self Care | Payer: MEDICAID | Attending: Emergency Medicine

## 2018-11-14 DIAGNOSIS — R0789 Other chest pain: Secondary | ICD-10-CM

## 2018-11-14 LAB — COMPREHENSIVE METABOLIC PANEL
ALT: 12 U/L (ref 4–36)
AST: 15 U/L (ref 8–33)
Albumin/Globulin Ratio: 1.4 (ref 0.8–2.0)
Albumin: 4.6 g/dL (ref 3.4–4.8)
Alkaline Phosphatase: 113 U/L — ABNORMAL HIGH (ref 25–100)
Anion Gap: 15 (ref 3–16)
BUN: 18 mg/dL (ref 6–20)
CO2: 24 mmol/L (ref 20–30)
Calcium: 9.9 mg/dL (ref 8.5–10.5)
Chloride: 102 mmol/L (ref 98–107)
Creatinine: 0.7 mg/dL (ref 0.4–1.2)
GFR African American: >59 (ref >59)
GFR Non-African American: >60 (ref >59)
Globulin: 3.2 g/dL
Glucose: 131 mg/dL — ABNORMAL HIGH (ref 74–106)
Potassium: 3.3 mmol/L — ABNORMAL LOW (ref 3.4–5.1)
Sodium: 141 mmol/L (ref 136–145)
Total Bilirubin: 0.6 mg/dL (ref 0.3–1.2)
Total Protein: 7.8 g/dL (ref 6.4–8.3)

## 2018-11-14 LAB — LIPASE: Lipase: 24 U/L (ref 5.6–51.3)

## 2018-11-14 LAB — DRUG SCREEN MULTI URINE
Amphetamine Screen, Urine: NEGATIVE (ref <1000)
Barbiturate Screen, Ur: NEGATIVE (ref <200)
Benzodiazepine Screen, Urine: NEGATIVE (ref <300)
Cannabinoid Scrn, Ur: POSITIVE — AB (ref <50)
Cocaine Metabolite Screen, Urine: NEGATIVE (ref <300)
Methadone Screen, Urine: NEGATIVE (ref <300)
Methamphetamine, Urine: NEGATIVE (ref <1000)
Opiate Scrn, Ur: NEGATIVE (ref <300)
PCP Screen, Urine: NEGATIVE (ref <25)
Propoxyphene Screen, Urine: NEGATIVE (ref <300)
Tricyclic: POSITIVE — AB (ref <300)
UR Oxycodone Rapid Screen: NEGATIVE (ref <100)

## 2018-11-14 LAB — URINALYSIS WITH REFLEX TO CULTURE
Blood, Urine: NEGATIVE
Glucose, Ur: NEGATIVE mg/dL
Ketones, Urine: NEGATIVE mg/dL
Leukocyte Esterase, Urine: NEGATIVE
Nitrite, Urine: NEGATIVE
Specific Gravity, UA: >=1.03 (ref 1.005–1.030)
Urobilinogen, Urine: 0.2 E.U./dL (ref <2.0)
pH, UA: 6 (ref 5.0–8.0)

## 2018-11-14 LAB — CBC WITH AUTO DIFFERENTIAL
Basophils %: 0.1 %
Basophils Absolute: 0 10*3/uL (ref 0.0–0.1)
Eosinophils %: 0.1 %
Eosinophils Absolute: 0 10*3/uL (ref 0.0–0.4)
Hematocrit: 40.5 % (ref 37.0–47.0)
Hemoglobin: 13.2 g/dL (ref 11.5–16.5)
Immature Granulocytes #: 0 10*3/uL
Immature Granulocytes %: 0.2 % (ref 0.0–5.0)
Lymphocytes %: 7.8 %
Lymphocytes Absolute: 0.7 10*3/uL — ABNORMAL LOW (ref 1.5–4.0)
MCH: 25.3 pg — ABNORMAL LOW (ref 27.0–32.0)
MCHC: 32.6 g/dL (ref 31.0–35.0)
MCV: 77.7 fL — ABNORMAL LOW (ref 80.0–100.0)
MPV: 9.9 fL (ref 6.0–10.0)
Monocytes %: 2.6 %
Monocytes Absolute: 0.2 10*3/uL (ref 0.2–0.8)
Neutrophils %: 89.2 %
Neutrophils Absolute: 7.9 10*3/uL — ABNORMAL HIGH (ref 2.0–7.5)
Platelets: 194 10*3/uL (ref 150–400)
RBC: 5.21 M/uL (ref 3.80–5.80)
RDW: 13.4 % (ref 11.0–16.0)
WBC: 8.9 10*3/uL (ref 4.0–11.0)

## 2018-11-14 LAB — BRAIN NATRIURETIC PEPTIDE: Pro-BNP: 53 pg/mL (ref 0–900)

## 2018-11-14 LAB — MICROSCOPIC URINALYSIS: RBC, UA: NONE SEEN /HPF (ref 0–2)

## 2018-11-14 LAB — TROPONIN: Troponin: <0.3 ng/mL (ref <0.30)

## 2018-11-14 LAB — PREGNANCY, URINE: HCG(Urine) Pregnancy Test: NEGATIVE

## 2018-11-14 LAB — TSH WITH REFLEX: TSH: 0.45 u[IU]/mL (ref 0.35–5.50)

## 2018-11-14 MED ORDER — SODIUM CHLORIDE 0.9 % IV BOLUS
0.9 % | Freq: Once | INTRAVENOUS | Status: AC
Start: 2018-11-14 — End: 2018-11-14
  Administered 2018-11-14: 10:00:00 1000 mL via INTRAVENOUS

## 2018-11-14 MED ORDER — IOPAMIDOL 76 % IV SOLN
76 % | Freq: Once | INTRAVENOUS | Status: AC | PRN
Start: 2018-11-14 — End: 2018-11-14
  Administered 2018-11-14: 12:00:00 100 mL via INTRAVENOUS

## 2018-11-14 MED ORDER — ONDANSETRON HCL 4 MG/2ML IJ SOLN
4 MG/2ML | Freq: Once | INTRAMUSCULAR | Status: AC
Start: 2018-11-14 — End: 2018-11-14
  Administered 2018-11-14: 11:00:00 4 mg via INTRAVENOUS

## 2018-11-14 MED ORDER — KETOROLAC TROMETHAMINE 30 MG/ML IJ SOLN
30 MG/ML | Freq: Once | INTRAMUSCULAR | Status: AC
Start: 2018-11-14 — End: 2018-11-14
  Administered 2018-11-14: 11:00:00 30 mg via INTRAVENOUS

## 2018-11-14 MED FILL — KETOROLAC TROMETHAMINE 30 MG/ML IJ SOLN: 30 mg/mL | INTRAMUSCULAR | Qty: 1

## 2018-11-14 MED FILL — ONDANSETRON HCL 4 MG/2ML IJ SOLN: 4 MG/2ML | INTRAMUSCULAR | Qty: 2

## 2018-11-14 NOTE — ED Notes (Signed)
AVS reviewed with patient, understanding verbalized. Patient encouraged to follow up with pcp and /or return to ED if symptoms start again. Patient ambulates out of ED at this time with no further needs or concerns voiced. No chest pain / soa voiced.     Eston Mould, RN  11/14/18 [phone]

## 2018-11-14 NOTE — ED Notes (Signed)
Pt. Unable to void at this time.     Tommie Ard, RN  11/14/18 [phone]

## 2018-11-14 NOTE — ED Notes (Signed)
Dr Orbie Pyo notified that radiology reports are back.     Eston Mould, RN  11/14/18 [phone]

## 2018-11-14 NOTE — ED Notes (Signed)
Labs obtained and sent to lab.     Tommie Ard, RN  11/14/18 [phone]
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
## 2018-11-14 NOTE — ED Notes (Signed)
Report given to [facility].     Tommie Ard, RN  11/14/18 [phone]

## 2018-11-14 NOTE — ED Notes (Signed)
Patient to room from CT. Patient made comfortable. No complaints of chest pain at this time. ST on monitor. RR regular and unlabored. Skin pk / w /d. Patient informed to call ED staff if CP / soa comes back. Call bell within reach.     Eston Mould, RN  11/14/18 [phone]

## 2018-11-14 NOTE — ED Provider Notes (Signed)
[facility] AND [facility] EMERGENCY DEPARTMENT  eMERGENCY dEPARTMENT eNCOUnter      Pt Name: Kristin Munoz  MRN: [id number]  Birthdate 1981-09-07  Date of evaluation: 11/14/2018  Provider: Robie Ridge, MD    CHIEF COMPLAINT       Chief Complaint   Patient presents with   . Chest Pain     Onset 24 hrs.C/o n/v/d.         HISTORY OF PRESENT ILLNESS   (Location/Symptom, Timing/Onset, Context/Setting, Quality, Duration, Modifying Factors, Severity)  Note limiting factors.   Kristin Munoz is a 38 y.o. female who presents to the emergency department signed out to me per Dr Helmut Muster pending work up results and to d/c patient should results be negative. Patient sleeping comfortably in exam room.      Nursing Notes were reviewed.    REVIEW OF SYSTEMS    (2-9 systems for level 4, 10 or more forlevel 5)     Review of Systems   Constitutional: Negative for chills and fever.   HENT: Negative for congestion, ear pain, postnasal drip, rhinorrhea, sinus pressure, sneezing, sore throat and trouble swallowing.    Eyes: Negative for pain, discharge and redness.   Respiratory: Negative for cough, chest tightness, shortness of breath and wheezing.    Cardiovascular: Positive for chest pain. Negative for palpitations and leg swelling.   Gastrointestinal: Positive for diarrhea, nausea and vomiting. Negative for abdominal pain and constipation.   Genitourinary: Negative for dysuria, flank pain, frequency, hematuria and urgency.   Musculoskeletal: Negative for back pain, joint swelling and neck pain.   Skin: Negative for pallor and rash.   Neurological: Negative for dizziness, syncope, weakness, numbness and headaches.   Psychiatric/Behavioral: Negative for confusion and hallucinations. The patient is not nervous/anxious.            PAST MEDICAL HISTORY     Past Medical History:   Diagnosis Date   . Anemia    . Arthritis    . Asthma    . Atrial fibrillation (HCC)    . CAD (coronary artery disease)    . Cancer (HCC)     ovarian, bone,  breast and colon   . Cardiac arrest [place])     patient states that her heart stopped and she had to be revived by her daughter   . Cerebral artery occlusion with cerebral infarction (HCC)    . CHF (congestive heart failure) (HCC)    . Chronic kidney disease    . COPD (chronic obstructive pulmonary disease) (HCC)    . Depression    . Diabetes mellitus (HCC)     diet control   . Headache    . Hypertension    . Hypokalemia    . Kidney stone    . Liver failure (HCC)    . Myocardial infarction (HCC) 06/2016   . Pulmonary embolism (HCC)    . Thyroid disease          SURGICAL HISTORY       Past Surgical History:   Procedure Laterality Date   . APPENDECTOMY     . BACK SURGERY     . BREAST LUMPECTOMY Left    . BREAST SURGERY     . CHOLECYSTECTOMY     . HEMORRHOID SURGERY     . HYSTERECTOMY     . KIDNEY STONE SURGERY      x 72 per patient   . KNEE SURGERY Left    . LAPAROSCOPY  x36 per patient   . LUNG SURGERY      for pulmonary embolisms         CURRENT MEDICATIONS       Discharge Medication List as of 11/14/2018  8:14 AM      CONTINUE these medications which have NOT CHANGED    Details   diltiazem (CARDIZEM CD) 360 MG extended release capsule Take 1 capsule by mouth daily, Disp-30 capsule, R-5Normal      cloNIDine (CATAPRES) 0.1 MG tablet Take 1 tablet by mouth 2 times daily, Disp-60 tablet, R-5Normal      potassium chloride (KLOR-CON M) 20 MEQ extended release tablet Take 2 tablets by mouth 3 times daily, Disp-180 tablet, R-5Normal      Melatonin 10 MG TABS Take by mouthHistorical Med      Potassium Chloride (KLOR-CON PO) Take 20 mEq by mouth Take one tablet by mouth 2 times a dayHistorical Med      traMADol (ULTRAM) 50 MG tablet Take 50 mg by mouth every 6 hours as needed for Pain.Historical Med      diclofenac sodium 1 % GEL Apply 2 g topically 4 times daily as needed for Pain, Topical, 4 TIMES DAILY PRN Starting Wed 10/25/2018, Disp-1 Tube, R-5, Normal      EPINEPHrine (EPIPEN) 0.3 MG/0.3ML SOAJ injection Inject 0.3  mLs into the muscle as needed (allergic reaction) Use as directed for allergic reaction, Disp-1 each, R-2Normal      ferrous gluconate (FERGON) 324 (38 Fe) MG tablet Take 1 tablet by mouth daily (with breakfast), Disp-30 tablet, R-5Normal      fluticasone (FLONASE) 50 MCG/ACT nasal spray 2 sprays by Each Nostril route daily, Disp-1 Bottle, R-5Normal      nitroGLYCERIN (NITROSTAT) 0.4 MG SL tablet Place 1 tablet under the tongue every 5 minutes as needed for Chest pain up to max of 3 total doses. If no relief after 1 dose, call 911., Disp-25 tablet, R-5Normal      traZODone (DESYREL) 100 MG tablet Take 1 tablet by mouth nightly, Disp-30 tablet, R-5Normal      cyclobenzaprine (FLEXERIL) 10 MG tablet Take 1 tablet by mouth 3 times daily as needed for Muscle spasms, Disp-90 tablet, R-5Normal      SUMAtriptan (IMITREX) 100 MG tablet Take 1 tablet by mouth once as needed for Migraine, Disp-9 tablet, R-5Normal      ibuprofen (ADVIL;MOTRIN) 800 MG tablet Take 1 tablet by mouth every 8 hours, Disp-90 tablet, R-5Normal      levothyroxine (SYNTHROID) 125 MCG tablet Take 1 tablet by mouth Daily, Disp-30 tablet, R-5Normal      lisinopril (PRINIVIL;ZESTRIL) 40 MG tablet Take 1 tablet by mouth daily, Disp-30 tablet, R-5Normal      metoprolol succinate (TOPROL XL) 50 MG extended release tablet Take 1 tablet by mouth daily, Disp-30 tablet, R-3Normal      magnesium oxide (MAG-OX) 400 MG tablet Take 1 tablet by mouth 2 times daily, Disp-60 tablet, R-5Normal      albuterol sulfate HFA 108 (90 Base) MCG/ACT inhaler Inhale 2 puffs into the lungs every 4 hours as needed for Wheezing, Disp-1 Inhaler, R-5Normal      promethazine (PHENERGAN) 25 MG tablet Take 1 tablet by mouth every 8 hours as needed for Nausea, Disp-60 tablet, R-5Normal      omeprazole (PRILOSEC) 20 MG delayed release capsule Take 20 mg by mouth three times dailyHistorical Med      clonazePAM (KLONOPIN) 0.5 MG tablet Take 0.5 mg by mouth 2 times daily as  neededHistorical  Med      gabapentin (NEURONTIN) 400 MG capsule Take 400 mg by mouth 4 times daily. Historical Med      metoprolol tartrate (LOPRESSOR) 50 MG tablet Take 50 mg by mouth daily Historical Med             ALLERGIES     Pcn [penicillins]; Zanaflex [tizanidine hcl]; Pertussis vaccines; Polio virus vaccine live oral trivalent; Tetanus toxoids; and Levaquin [levofloxacin in d5w]    FAMILY HISTORY     No family history on file.       SOCIAL HISTORY       Social History     Socioeconomic History   . Marital status: Married     Spouse name: Not on file   . Number of children: Not on file   . Years of education: Not on file   . Highest education level: Not on file   Occupational History   . Not on file   Social Needs   . Financial resource strain: Not on file   . Food insecurity:     Worry: Not on file     Inability: Not on file   . Transportation needs:     Medical: Not on file     Non-medical: Not on file   Tobacco Use   . Smoking status: Never Smoker   . Smokeless tobacco: Never Used   Substance and Sexual Activity   . Alcohol use: No   . Drug use: Yes     Types: Marijuana   . Sexual activity: Not on file   Lifestyle   . Physical activity:     Days per week: Not on file     Minutes per session: Not on file   . Stress: Not on file   Relationships   . Social connections:     Talks on phone: Not on file     Gets together: Not on file     Attends religious service: Not on file     Active member of club or organization: Not on file     Attends meetings of clubs or organizations: Not on file     Relationship status: Not on file   . Intimate partner violence:     Fear of current or ex partner: Not on file     Emotionally abused: Not on file     Physically abused: Not on file     Forced sexual activity: Not on file   Other Topics Concern   . Not on file   Social History Narrative   . Not on file       SCREENINGS             PHYSICAL EXAM    (up to 7 for level 4, 8 or more for level 5)     ED Triage Vitals [11/14/18 0455]   BP Temp  Temp Source Pulse Resp SpO2 Height Weight   (!) 152/107 99.4 F (37.4 C) Oral 107 16 98 % 5\' 2"  (1.575 m) 165 lb (74.8 kg)       Physical Exam  Constitutional:       Appearance: She is well-developed.   HENT:      Head: Normocephalic and atraumatic.   Eyes:      Conjunctiva/sclera: Conjunctivae normal.      Pupils: Pupils are equal, round, and reactive to light.   Neck:      Musculoskeletal: Neck supple.   Cardiovascular:  Rate and Rhythm: Normal rate and regular rhythm.   Pulmonary:      Effort: Pulmonary effort is normal. No respiratory distress.      Breath sounds: Normal breath sounds.   Abdominal:      General: Bowel sounds are normal. There is no distension.      Palpations: Abdomen is soft.      Tenderness: There is no abdominal tenderness. There is no guarding.   Musculoskeletal: Normal range of motion.   Skin:     General: Skin is warm and dry.   Neurological:      Mental Status: She is alert and oriented to person, place, and time.         DIAGNOSTIC RESULTS     EKG: All EKG's are interpreted by the Emergency Department Physician who either signs or Co-signs this chart in the absence of a cardiologist.    EKG no acute changes    RADIOLOGY:   Non-plain film images such as CT, Ultrasound and MRI are read by the radiologist. Plain radiographic images are visualized andpreliminarily interpreted by the emergency physician with the below findings:    CT abd/pelvis per radiology report    Interpretationper the Radiologist below, if available at the time of this note:    CT ABDOMEN PELVIS WO CONTRAST Additional Contrast? None   Final Result      1. Nonobstructive bilateral renal calculi.         No hydronephrosis.      Hyperdense bilateral renal pyramids as described.      Fatty infiltration of the liver.      Colonic diverticulosis without diverticulitis.      XR CHEST PORTABLE   Final Result      No lobar consolidation      CTA PULMONARY W CONTRAST   Final Result      1. No evidence for acute pulmonary  embolism.         6 mm pulmonary nodule left lower lobe indeterminate. Follow-up CT chest in 3 months without contrast recommended as neoplasm is not excluded.      INCIDENTAL FINDINGS:  ED INCIDENTAL FINDING LUNG NODULE            ED BEDSIDE ULTRASOUND:   Performed by ED Physician - none    LABS:  Labs Reviewed   CBC WITH AUTO DIFFERENTIAL - Abnormal; Notable for the following components:       Result Value    MCV 77.7 (*)     MCH 25.3 (*)     Neutrophils Absolute 7.9 (*)     Lymphocytes Absolute 0.7 (*)     All other components within normal limits    Narrative:     Performed at:  [facility] [place] [facility]  [address]  [place], [place] [address]   Phone [phone]   COMPREHENSIVE METABOLIC PANEL - Abnormal; Notable for the following components:    Potassium 3.3 (*)     Glucose 131 (*)     Alkaline Phosphatase 113 (*)     All other components within normal limits    Narrative:     Performed at:  [facility] [place] [facility]  [address],  [place], [place] [address]   Phone [phone]   URINE RT REFLEX TO CULTURE - Abnormal; Notable for the following components:    Bilirubin Urine SMALL (*)     Protein, UA TRACE (*)  All other components within normal limits    Narrative:     Performed at:  [facility] [place] [facility]  [address]  [place], [place] [address]   Phone [phone]   DRUG SCREEN MULTI URINE - Abnormal; Notable for the following components:    Cannabinoid Scrn, Ur POSITIVE (*)     Tricyclic POSITIVE (*)     All other components within normal limits    Narrative:     Performed at:  [facility] [place] [facility]  [address]  [place], [place] [address]   Phone [phone]   MICROSCOPIC URINALYSIS - Abnormal; Notable for the following components:    Mucus, UA 3+ (*)     All other components within normal limits    Narrative:     Performed at:  [facility] [place] [facility]  [address]  [place], [place] [address]   Phone [phone]   BRAIN NATRIURETIC PEPTIDE    Narrative:     Performed at:  [facility] [place] [facility]  [address]  [place], [place] [address]   Phone [phone]   LIPASE    Narrative:     Performed at:  [facility] [place] [facility]  [address],  [place], [place] [address]   Phone [phone]   TROPONIN    Narrative:     Performed at:  [facility] [place] [facility]  [address],  [place], [place] [address]   Phone [phone]   PREGNANCY, URINE    Narrative:     Performed at:  [facility] [place] [facility]  [address],  [place], [place] [address]   Phone [phone]   TSH WITH REFLEX    Narrative:     Performed at:  [facility] [place] [facility]  [address]  [place], [place] [address]   Phone [phone]       All other labs were within normal range or not returned as of this dictation.    EMERGENCY DEPARTMENT COURSE and DIFFERENTIAL DIAGNOSIS/MDM:   Vitals:    Vitals:    11/14/18 0736 11/14/18 0745 11/14/18 0800 11/14/18 0824   BP: 125/86 125/86 129/86    Pulse: 99 113 92    Resp:    18   Temp:       TempSrc:       SpO2: 99% 99% 98%    Weight:       Height:               CRITICAL CARE TIME   Total Critical Care time was  minutes, excluding separatelyreportable procedures.  There was a high probability ofclinically significant/life threatening deterioration in the patient's condition which required my urgent intervention.     CONSULTS:  None    PROCEDURES:  None    PROGRESS NOTES:    Complete work up done per Dr Helmut Muster negative. Patient informed of negative results. D/C home. Follow up with PCP as needed.    FINAL IMPRESSION      1. Chest pain, unspecified type    2. Gastroenteritis          Final diagnoses:   Chest pain, unspecified type   Gastroenteritis       DISPOSITION/PLAN   DISPOSITION  Decision To Discharge  11/14/2018 08:14:09 AM      PATIENT REFERRED TO:  Robin Searing, APRN  [address] [place] [address]  [phone]      If symptoms worsen      DISCHARGE MEDICATIONS:  Discharge Medication List as of 11/14/2018  8:14 AM            (Please note thatportions of this note may have been completed with a voice recognition program.  Efforts were [facility] edit the dictations but occasionally words are mis-transcribed.)    Robie Ridge, MD (electronically signed)  Attending Emergency Physician        Alfonse Alpers, MD  11/21/18 [phone]

## 2018-11-14 NOTE — ED Notes (Signed)
Patient report from [place], [place]. Patient currently in CT.     Eston Mould, RN  11/14/18 [phone]

## 2018-11-21 ENCOUNTER — Inpatient Hospital Stay: Payer: MEDICAID | Primary: Family

## 2018-11-21 ENCOUNTER — Ambulatory Visit: Admit: 2018-11-21 | Discharge: 2018-11-21 | Payer: MEDICAID | Attending: Family | Primary: Family

## 2018-11-21 DIAGNOSIS — F419 Anxiety disorder, unspecified: Secondary | ICD-10-CM

## 2018-11-21 DIAGNOSIS — R319 Hematuria, unspecified: Secondary | ICD-10-CM

## 2018-11-21 LAB — URINALYSIS
Bilirubin Urine: NEGATIVE
Glucose, Ur: NEGATIVE mg/dL
Ketones, Urine: NEGATIVE mg/dL
Leukocyte Esterase, Urine: NEGATIVE
Nitrite, Urine: NEGATIVE
Protein, UA: 30 mg/dL — AB
Specific Gravity, UA: 1.02 (ref 1.005–1.030)
Urobilinogen, Urine: 0.2 E.U./dL (ref <2.0)
pH, UA: 6.5 (ref 5.0–8.0)

## 2018-11-21 LAB — MICROSCOPIC URINALYSIS: RBC, UA: >100 /HPF — AB (ref 0–4)

## 2018-11-21 MED ORDER — ONDANSETRON HCL 4 MG PO TABS
4 MG | ORAL_TABLET | Freq: Three times a day (TID) | ORAL | 2 refills | Status: DC | PRN
Start: 2018-11-21 — End: 2019-04-05

## 2018-11-21 MED ORDER — HYDROXYZINE PAMOATE 25 MG PO CAPS
25 MG | ORAL_CAPSULE | Freq: Three times a day (TID) | ORAL | 5 refills | Status: DC | PRN
Start: 2018-11-21 — End: 2019-08-06

## 2018-11-21 MED ORDER — OMEPRAZOLE 40 MG PO CPDR
40 MG | ORAL_CAPSULE | Freq: Every day | ORAL | 5 refills | Status: DC
Start: 2018-11-21 — End: 2019-03-16

## 2018-11-21 MED ORDER — FREESTYLE LANCETS MISC
Freq: Every day | 5 refills | Status: DC
Start: 2018-11-21 — End: 2019-06-20

## 2018-11-21 MED ORDER — CYANOCOBALAMIN 1000 MCG/ML IJ SOLN
1000 MCG/ML | Freq: Once | INTRAMUSCULAR | Status: AC
Start: 2018-11-21 — End: 2018-11-21
  Administered 2018-11-21: 20:00:00 1000 ug via INTRAMUSCULAR

## 2018-11-21 MED ORDER — FREESTYLE SYSTEM KIT
PACK | Freq: Every day | 0 refills | Status: DC
Start: 2018-11-21 — End: 2019-06-20

## 2018-11-21 MED ORDER — ESCITALOPRAM OXALATE 10 MG PO TABS
10 MG | ORAL_TABLET | Freq: Every day | ORAL | 1 refills | Status: DC
Start: 2018-11-21 — End: 2019-01-31

## 2018-11-21 MED ORDER — GLUCOSE BLOOD VI STRP
Freq: Every day | 5 refills | Status: DC
Start: 2018-11-21 — End: 2019-06-20

## 2018-11-21 NOTE — Patient Instructions (Signed)
Stop Magnesium.  Zofran as needed.  Restart Prilosec.

## 2018-11-21 NOTE — Progress Notes (Signed)
Chief Complaint   Patient presents with   . Back Pain   . Congestive Heart Failure   . Anemia   . Atrial Fibrillation       Have you seen any other physician or provider since your last visit yes - Urology    Have you had any other diagnostic tests since your last visit? no    Have you changed or stopped any medications since your last visit? no     Flu shot pended.          Pt here for f/u. Requesting refills on Tramadol, Klonopin and Gabapentin. Scheduled to see Cardiology 2/27. Waiting on records from Oncology.     Vaccine Information Sheet, "Influenza - Inactivated"  given to Kristin Munoz, or parent/legal guardian of  Kristin Munoz and verbalized understanding.    Patient responses:    Have you ever had a reaction to a flu vaccine? No  Do you have any current illness?  No  Have you ever had Guillian Barre Syndrome?  No  Do you have a serious allergy to any of the follow: Neomycin, Polymyxin, Thimerosal, eggs or egg products? No    Flu vaccine given per order. Please see immunization tab.    Risks and benefits explained.  Current VIS given.      Immunizations Administered     Name Date Dose Route    Influenza, Quadv, IM, PF (6 mo and older Fluzone, Flulaval, Fluarix, and 3 yrs and older Afluria) 11/21/2018 0.5 mL Intramuscular    Site: Deltoid- Left    Lot: [id number]    NDC: 33332-319-02        After obtaining consent, and per orders of Randolm Idol, injection of B12 given in Right deltoid by Minna Merritts. Patient instructed to remain in clinic for 20 minutes afterwards, and to report any adverse reaction to me immediately.

## 2018-11-21 NOTE — Progress Notes (Signed)
SUBJECTIVE:    Patient ID: Kristin Munoz is a 38 y.o. female.    Medical History Review  Past Medical, Family, and Social History reviewed and does contribute to the patient presenting condition    Health Maintenance Due   Topic Date Due   ??? Varicella vaccine (1 of 2 - 2-dose childhood series) 08/26/1981   ??? HIV screen  08/27/1995   ??? Cervical cancer screen  08/26/2001       HPI:   Chief Complaint   Patient presents with   ??? Back Pain   ??? Congestive Heart Failure   ??? Anemia   ??? Atrial Fibrillation   She has had nausea, vomiting, and diarrhea for the past 13 days. She is having upper abd pain as well and feels like the area swells. Se would like to have a flu and pneumonia. She states she needs diabetic testing supplies.    Patient's medications, allergies, past medical, surgical, social and family histories were reviewed and updated as appropriate.      Review of Systems Reviewed and acurate. See MA note.    OBJECTIVE:  BP (!) 143/95    Pulse 91    Resp 16    Wt 168 lb (76.2 kg)    SpO2 98%    BMI 30.73 kg/m??    Physical Exam  Vitals signs reviewed.   Constitutional:       General: She is not in acute distress.     Appearance: She is well-developed.   HENT:      Head: Normocephalic.      Right Ear: Tympanic membrane normal.      Left Ear: Tympanic membrane normal.      Mouth/Throat:      Pharynx: No oropharyngeal exudate.   Eyes:      General: Lids are normal.   Neck:      Musculoskeletal: Neck supple.   Cardiovascular:      Rate and Rhythm: Normal rate and regular rhythm.      Heart sounds: Normal heart sounds.   Pulmonary:      Effort: Pulmonary effort is normal.      Breath sounds: Normal breath sounds.   Abdominal:      General: Bowel sounds are normal. There is no distension.      Palpations: Abdomen is soft.      Tenderness: There is abdominal tenderness.   Lymphadenopathy:      Cervical: No cervical adenopathy.   Skin:     General: Skin is warm and dry.   Neurological:      Mental Status: She is alert  and oriented to person, place, and time.         Results in Past 30 Days  Result Component Current Result Ref Range Previous Result Ref Range   Alb 4.6 (11/14/2018) 3.4 - 4.8 g/dL Not in Time Range    Albumin/Globulin Ratio 1.4 (11/14/2018) 0.8 - 2.0 Not in Time Range    Alkaline Phosphatase 113 (H) (11/14/2018) 25 - 100 U/L Not in Time Range    ALT 12 (11/14/2018) 4 - 36 U/L Not in Time Range    AST 15 (11/14/2018) 8 - 33 U/L Not in Time Range    BUN 18 (11/14/2018) 6 - 20 mg/dL Not in Time Range    Calcium 9.9 (11/14/2018) 8.5 - 10.5 mg/dL Not in Time Range    Chloride 102 (11/14/2018) 98 - 107 mmol/L Not in Time Range    CO2 24 (11/14/2018)  20 - 30 mmol/L Not in Time Range    CREATININE 0.7 (11/14/2018) 0.4 - 1.2 mg/dL Not in Time Range    GFR African American >59 (11/14/2018) >59 Not in Time Range    GFR Non-African American >60 (11/14/2018) >59 Not in Time Range    Globulin 3.2 (11/14/2018) g/dL Not in Time Range    Glucose 131 (H) (11/14/2018) 74 - 106 mg/dL Not in Time Range    Potassium 3.3 (L) (11/14/2018) 3.4 - 5.1 mmol/L Not in Time Range    Sodium 141 (11/14/2018) 136 - 145 mmol/L Not in Time Range    Total Bilirubin 0.6 (11/14/2018) 0.3 - 1.2 mg/dL Not in Time Range    Total Protein 7.8 (11/14/2018) 6.4 - 8.3 g/dL Not in Time Range      Microscopic Examination (no units)   Date Value   11/21/2018 YES       Lab Results   Component Value Date    WBC 8.9 11/14/2018    NEUTROABS 7.9 (H) 11/14/2018    HGB 13.2 11/14/2018    HCT 40.5 11/14/2018    MCV 77.7 (L) 11/14/2018    PLT 194 11/14/2018     No results found for: TSH    Prior to Visit Medications    Medication Sig Taking? Authorizing Provider   omeprazole (PRILOSEC) 40 MG delayed release capsule Take 1 capsule by mouth Daily Yes Broadus John, APRN   glucose monitoring kit (FREESTYLE) monitoring kit 1 kit by Does not apply route daily May sub covered brand Dx:DM2 Yes Broadus John, APRN   FreeStyle Lancets MISC 1 each by Does not apply route daily Dx:DM2 Yes Broadus John, APRN   blood glucose test strips (FREESTYLE TEST STRIPS) strip 1 each by In Vitro route daily Dx:DM2 Yes Broadus John, APRN   escitalopram (LEXAPRO) 10 MG tablet Take 1 tablet by mouth daily Yes Broadus John, APRN   hydrOXYzine (VISTARIL) 25 MG capsule Take 1 capsule by mouth 3 times daily as needed for Anxiety Yes Broadus John, APRN   ondansetron (ZOFRAN) 4 MG tablet Take 1 tablet by mouth 3 times daily as needed for Nausea or Vomiting Yes Broadus John, APRN   diltiazem (CARDIZEM CD) 360 MG extended release capsule Take 1 capsule by mouth daily Yes Broadus John, APRN   cloNIDine (CATAPRES) 0.1 MG tablet Take 1 tablet by mouth 2 times daily Yes Broadus John, APRN   potassium chloride (KLOR-CON M) 20 MEQ extended release tablet Take 2 tablets by mouth 3 times daily Yes Broadus John, APRN   ferrous gluconate (FERGON) 324 (38 Fe) MG tablet Take 1 tablet by mouth daily (with breakfast) Yes Broadus John, APRN   fluticasone (FLONASE) 50 MCG/ACT nasal spray 2 sprays by Each Nostril route daily Yes Broadus John, APRN   nitroGLYCERIN (NITROSTAT) 0.4 MG SL tablet Place 1 tablet under the tongue every 5 minutes as needed for Chest pain up to max of 3 total doses. If no relief after 1 dose, call 911. Yes Broadus John, APRN   traZODone (DESYREL) 100 MG tablet Take 1 tablet by mouth nightly Yes Broadus John, APRN   cyclobenzaprine (FLEXERIL) 10 MG tablet Take 1 tablet by mouth 3 times daily as needed for Muscle spasms Yes Broadus John, APRN   levothyroxine (SYNTHROID) 125 MCG tablet Take 1 tablet by mouth Daily Yes Broadus John, APRN   lisinopril (PRINIVIL;ZESTRIL) 40 MG tablet  Take 1 tablet by mouth daily Yes Broadus John, APRN   metoprolol succinate (TOPROL XL) 50 MG extended release tablet Take 1 tablet by mouth daily Yes Broadus John, APRN   magnesium oxide (MAG-OX) 400 MG tablet Take 1 tablet by mouth 2 times daily Yes Broadus John, APRN   albuterol sulfate HFA 108 (90 Base) MCG/ACT  inhaler Inhale 2 puffs into the lungs every 4 hours as needed for Wheezing Yes Broadus John, APRN   promethazine (PHENERGAN) 25 MG tablet Take 1 tablet by mouth every 8 hours as needed for Nausea Yes Broadus John, APRN   Melatonin 10 MG TABS Take by mouth  Historical Provider, MD   Potassium Chloride (KLOR-CON PO) Take 20 mEq by mouth Take one tablet by mouth 2 times a day  Historical Provider, MD   traMADol (ULTRAM) 50 MG tablet Take 50 mg by mouth every 6 hours as needed for Pain.  Historical Provider, MD   diclofenac sodium 1 % GEL Apply 2 g topically 4 times daily as needed for Pain  Broadus John, APRN   EPINEPHrine (EPIPEN) 0.3 MG/0.3ML SOAJ injection Inject 0.3 mLs into the muscle as needed (allergic reaction) Use as directed for allergic reaction  Broadus John, APRN   SUMAtriptan (IMITREX) 100 MG tablet Take 1 tablet by mouth once as needed for Migraine  Broadus John, APRN   ibuprofen (ADVIL;MOTRIN) 800 MG tablet Take 1 tablet by mouth every 8 hours  Broadus John, APRN   clonazePAM (KLONOPIN) 0.5 MG tablet Take 0.5 mg by mouth 2 times daily as needed  Historical Provider, MD   gabapentin (NEURONTIN) 400 MG capsule Take 400 mg by mouth 4 times daily.   Historical Provider, MD       ASSESSMENT:  1. Anxiety    2. Gastroesophageal reflux disease, esophagitis presence not specified    3. Nausea    4. Type 2 diabetes mellitus without complication, without long-term current use of insulin ([place])    5. Need for influenza vaccination    6. B12 deficiency        PLAN:  Orders Placed This Encounter   Medications   ??? omeprazole (PRILOSEC) 40 MG delayed release capsule     Sig: Take 1 capsule by mouth Daily     Dispense:  30 capsule     Refill:  5   ??? glucose monitoring kit (FREESTYLE) monitoring kit     Sig: 1 kit by Does not apply route daily May sub covered brand Dx:DM2     Dispense:  1 kit     Refill:  0   ??? FreeStyle Lancets MISC     Sig: 1 each by Does not apply route daily Dx:DM2     Dispense:  50 each      Refill:  5   ??? blood glucose test strips (FREESTYLE TEST STRIPS) strip     Sig: 1 each by In Vitro route daily Dx:DM2     Dispense:  50 each     Refill:  5   ??? escitalopram (LEXAPRO) 10 MG tablet     Sig: Take 1 tablet by mouth daily     Dispense:  30 tablet     Refill:  1   ??? hydrOXYzine (VISTARIL) 25 MG capsule     Sig: Take 1 capsule by mouth 3 times daily as needed for Anxiety     Dispense:  90 capsule     Refill:  5   ??? ondansetron (ZOFRAN) 4 MG tablet     Sig: Take 1 tablet by mouth 3 times daily as needed for Nausea or Vomiting     Dispense:  30 tablet     Refill:  2   ??? cyanocobalamin injection 1,000 mcg     Orders Placed This Encounter   Procedures   ??? INFLUENZA, QUADV, 3 YRS AND OLDER, IM PF, PREFILL SYR OR SDV, 0.5ML (AFLURIA QUADV, PF)     Patient Instructions   Stop Magnesium.  Zofran as needed.  Restart Prilosec.         Return in about 3 months (around 02/19/2019).

## 2018-11-24 NOTE — Telephone Encounter (Signed)
Our office requested outside records (on 2/25 when pt was her for an appointment) regarding hx of cancer; no notes pertaining to dx. Cancelled referral, pt didn't answer when I called to discuss referral.

## 2018-11-26 NOTE — Telephone Encounter (Signed)
Kristin Munoz's patient

## 2018-12-21 ENCOUNTER — Encounter: Attending: Family | Primary: Family

## 2019-01-02 ENCOUNTER — Ambulatory Visit: Admit: 2019-01-02 | Discharge: 2019-01-02 | Payer: MEDICAID | Attending: Family | Primary: Family

## 2019-01-02 DIAGNOSIS — R042 Hemoptysis: Secondary | ICD-10-CM

## 2019-01-02 LAB — POCT RAPID STREP A: Strep A Ag: NOT DETECTED

## 2019-01-02 LAB — POCT INFLUENZA A/B
Influenza A Ab: NEGATIVE
Influenza B Ab: NEGATIVE

## 2019-01-02 NOTE — Patient Instructions (Signed)
??   Keep a list of your medicines with you. List all of the prescription medicines, nonprescription medicines, supplements, natural remedies, and vitamins that you take. Tell your healthcare providers who treat you about all of the products you are taking. Your provider can provide you with a form to keep track of them. Just ask.  ?? Follow the directions that come with your medicine, including information about food or alcohol. Make sure you know how and when to take your medicine. Do not take more or less than you are supposed to take.  ?? Keep all medicines out of the reach of children.  ?? Store medicines according to the directions on the label.  ?? Monitor yourself. Learn to know how your body reacts to your new medicine and keep track of how it makes you feel before attempting (If your provider has allowed you to do so) to drive or go to work.   ?? Seek emergency medical attention if you think you have used too much of this medicine. An overdose of any prescription medicine can be fatal. Overdose symptoms may include extreme drowsiness, muscle weakness, confusion, cold and clammy skin, pinpoint pupils, shallow breathing, slow heart rate, fainting, or coma.  ?? Don't share prescription medicines with others, even when they seem to have the same symptoms. What may be good for you may be harmful to others.  ?? If you are no longer taking a prescribed medication and you have pills left please take your pills out of their original containers. Mix crushed pills with an undesirable substance, such as cat litter or used coffee grounds. Put the mixture into a disposable container with a lid, such as an empty margarine tub, or into a sealable bag.  Cover up or remove any of your personal information on the empty containers by covering it with black permanent marker or duct tape.  Place the sealed container with the mixture, and the empty drug containers, in the trash.   ?? If you use a medication that is in the form of a patch,  dispose of used patches by folding them in half so that the sticky sides meet, and then flushing them down a toilet. They should not be placed in the household trash where children or pets can find them.  ?? If you have any questions, ask your provider or pharmacist for more information.   ?? Be sure to keep all appointments for provider visits or tests.  We are committed to providing you with the best care possible.   In order to help us achieve these goals please remember to bring all medications, herbal products, and over the counter supplements with you to each visit.     If your provider has ordered testing for you, please be sure to follow up with our office if you have not received results within 7 days after the testing took place.     *If you receive a survey after visiting one of our offices, please take time to share your experience concerning your physician office visit. These surveys are confidential and no health information about you is shared.  We are eager to improve for you and we are counting on your feedback to help make that happen.

## 2019-01-02 NOTE — Progress Notes (Signed)
[facility] [place]  [address]  [place] [id number]  Dept: [phone]  Dept Fax: [phone]  Loc: [place] is c/o of Cough and Shortness of Breath        HPI:     Patient complains of 2 week(s) history of headache, shortness of breath and non-productive cough.  Symptoms have been worsening with time. She denies fever and diarrhea. Patient does not work. She is not running fever.  She has history of PE but is not on any anticoagulant.  She says she did take all her blood pressure medication this am. She complains of back pain.  She denies smoking but has had a lot of second hand smoke.  She has moved from [place] and she doesn't have any medical records just what she reports.     Relevant PMH: DM, Asthma, COPD and CHF.     Smoking history:  She  reports that she has never smoked. She has never used smokeless tobacco.     She has had ill contacts with URI symptoms.     Treatment to date: none.    Recent travel or possible COVID exposure:no    Past Medical History:   Diagnosis Date   ??? Anemia    ??? Arthritis    ??? Asthma    ??? Atrial fibrillation ([place])    ??? CAD (coronary artery disease)    ??? Cancer (HCC)     ovarian, bone, breast and colon   ??? Cardiac arrest [facility])     patient states that her heart stopped and she had to be revived by her daughter   ??? Cerebral artery occlusion with cerebral infarction [facility])    ??? CHF (congestive heart failure) ([place])    ??? Chronic kidney disease    ??? COPD (chronic obstructive pulmonary disease) ([place])    ??? Depression    ??? Diabetes mellitus ([place])     diet control   ??? Headache    ??? Hypertension    ??? Hypokalemia    ??? Kidney stone    ??? Liver failure ([place])    ??? Myocardial infarction ([place]) 06/2016   ??? Pulmonary embolism ([place])    ??? Thyroid disease       Current Outpatient Medications   Medication Sig Dispense Refill   ??? omeprazole (PRILOSEC) 40 MG delayed release capsule Take 1 capsule by mouth Daily 30 capsule 5    ??? glucose monitoring kit (FREESTYLE) monitoring kit 1 kit by Does not apply route daily May sub covered brand Dx:DM2 1 kit 0   ??? FreeStyle Lancets MISC 1 each by Does not apply route daily Dx:DM2 50 each 5   ??? blood glucose test strips (FREESTYLE TEST STRIPS) strip 1 each by In Vitro route daily Dx:DM2 50 each 5   ??? escitalopram (LEXAPRO) 10 MG tablet Take 1 tablet by mouth daily 30 tablet 1   ??? ondansetron (ZOFRAN) 4 MG tablet Take 1 tablet by mouth 3 times daily as needed for Nausea or Vomiting 30 tablet 2   ??? diltiazem (CARDIZEM CD) 360 MG extended release capsule Take 1 capsule by mouth daily 30 capsule 5   ??? cloNIDine (CATAPRES) 0.1 MG tablet Take 1 tablet by mouth 2 times daily 60 tablet 5   ??? potassium chloride (KLOR-CON M) 20 MEQ extended release tablet Take 2 tablets by mouth 3 times daily 180 tablet 5   ???  Melatonin 10 MG TABS Take by mouth     ??? Potassium Chloride (KLOR-CON PO) Take 20 mEq by mouth Take one tablet by mouth 2 times a day     ??? diclofenac sodium 1 % GEL Apply 2 g topically 4 times daily as needed for Pain 1 Tube 5   ??? EPINEPHrine (EPIPEN) 0.3 MG/0.3ML SOAJ injection Inject 0.3 mLs into the muscle as needed (allergic reaction) Use as directed for allergic reaction 1 each 2   ??? ferrous gluconate (FERGON) 324 (38 Fe) MG tablet Take 1 tablet by mouth daily (with breakfast) 30 tablet 5   ??? fluticasone (FLONASE) 50 MCG/ACT nasal spray 2 sprays by Each Nostril route daily 1 Bottle 5   ??? nitroGLYCERIN (NITROSTAT) 0.4 MG SL tablet Place 1 tablet under the tongue every 5 minutes as needed for Chest pain up to max of 3 total doses. If no relief after 1 dose, call 911. 25 tablet 5   ??? traZODone (DESYREL) 100 MG tablet Take 1 tablet by mouth nightly 30 tablet 5   ??? cyclobenzaprine (FLEXERIL) 10 MG tablet Take 1 tablet by mouth 3 times daily as needed for Muscle spasms 90 tablet 5   ??? SUMAtriptan (IMITREX) 100 MG tablet Take 1 tablet by mouth once as needed for Migraine 9 tablet 5   ??? ibuprofen  (ADVIL;MOTRIN) 800 MG tablet Take 1 tablet by mouth every 8 hours 90 tablet 5   ??? levothyroxine (SYNTHROID) 125 MCG tablet Take 1 tablet by mouth Daily 30 tablet 5   ??? lisinopril (PRINIVIL;ZESTRIL) 40 MG tablet Take 1 tablet by mouth daily 30 tablet 5   ??? metoprolol succinate (TOPROL XL) 50 MG extended release tablet Take 1 tablet by mouth daily 30 tablet 3   ??? magnesium oxide (MAG-OX) 400 MG tablet Take 1 tablet by mouth 2 times daily 60 tablet 5   ??? albuterol sulfate HFA 108 (90 Base) MCG/ACT inhaler Inhale 2 puffs into the lungs every 4 hours as needed for Wheezing 1 Inhaler 5   ??? promethazine (PHENERGAN) 25 MG tablet Take 1 tablet by mouth every 8 hours as needed for Nausea 60 tablet 5   ??? traMADol (ULTRAM) 50 MG tablet Take 50 mg by mouth every 6 hours as needed for Pain.     ??? clonazePAM (KLONOPIN) 0.5 MG tablet Take 0.5 mg by mouth 2 times daily as needed     ??? gabapentin (NEURONTIN) 400 MG capsule Take 400 mg by mouth 4 times daily.        No current facility-administered medications for this visit.      Allergies   Allergen Reactions   ??? Macadamia Nut Oil Anaphylaxis   ??? Naproxen Anaphylaxis and Shortness Of Breath     Other reaction(s): Nausea Only   ??? Pcn [Penicillins] Anaphylaxis   ??? Peanut-Containing Drug Products Anaphylaxis   ??? Sweet Potato Hives   ??? Tizanidine Anaphylaxis   ??? Zanaflex [Tizanidine Hcl] Anaphylaxis   ??? Desmopressin Acetate      Other reaction(s): Other (See Comments)  Reaction: ??Unknown   Reaction: ??Unknown    ??? Pertussis Vaccines    ??? Polio Virus Vaccine Live Oral Trivalent    ??? Tetanus Toxoids    ??? Levaquin [Levofloxacin In D5w] Rash   ??? Levofloxacin Rash       Health Maintenance   Topic Date Due   ??? Varicella vaccine (1 of 2 - 2-dose childhood series) 08/26/1981   ??? HIV screen  08/27/1995   ???  Cervical cancer screen  08/26/2001   ??? Potassium monitoring  11/15/2019   ??? Creatinine monitoring  11/15/2019   ??? Flu vaccine  Completed   ??? Hepatitis A vaccine  Aged Out   ??? Hepatitis B  vaccine  Aged Out   ??? Hib vaccine  Aged Out   ??? Meningococcal (ACWY) vaccine  Aged Out   ??? Pneumococcal 0-64 years Vaccine  Aged Out       Subjective:      Review of Systems   Constitutional: Positive for fatigue. Negative for fever.   HENT: Negative.    Respiratory: Positive for cough (coughing up blood) and shortness of breath.    Cardiovascular: Negative.    Gastrointestinal: Negative.    Genitourinary: Negative.    Musculoskeletal: Positive for back pain.   Skin: Negative.    Neurological: Negative.    Psychiatric/Behavioral: Negative.        Objective:     Physical Exam  Vitals signs and nursing note reviewed.   Constitutional:       Appearance: She is well-developed.   HENT:      Head: Normocephalic and atraumatic.   Eyes:      Conjunctiva/sclera: Conjunctivae normal.      Pupils: Pupils are equal, round, and reactive to light.   Neck:      Musculoskeletal: Normal range of motion and neck supple.      Thyroid: No thyromegaly.      Vascular: No JVD.   Cardiovascular:      Rate and Rhythm: Normal rate and regular rhythm.      Heart sounds: No murmur. No friction rub. No gallop.    Pulmonary:      Effort: Pulmonary effort is normal. No respiratory distress.      Breath sounds: Examination of the right-lower field reveals decreased breath sounds. Examination of the left-lower field reveals decreased breath sounds. Decreased breath sounds present. No wheezing or rales.   Abdominal:      General: Bowel sounds are normal. There is no distension.      Palpations: Abdomen is soft.      Tenderness: There is no abdominal tenderness. There is no guarding.   Musculoskeletal:         General: No tenderness.      Comments: Wearing brace on right leg     Skin:     General: Skin is warm and dry.      Findings: No rash.   Neurological:      Mental Status: She is alert and oriented to person, place, and time.   Psychiatric:         Judgment: Judgment normal.       BP (!) 161/100 (Site: Right Wrist, Position: Sitting, Cuff Size:  Medium Adult)    Pulse 68    Temp 98.5 ??F (36.9 ??C) (Oral)    Resp 16    Ht 5' 2" (1.575 m)    SpO2 98% Comment: room air   BMI 30.73 kg/m??   Results for orders placed or performed in visit on 01/02/19   POCT Influenza A/B   Result Value Ref Range    Influenza A Ab negative     Influenza B Ab negative    POCT rapid strep A   Result Value Ref Range    Strep A Ag None Detected None Detected       Assessment/ Plan     ASSESSMENT:         Diagnosis Orders  1. Cough with hemoptysis     2. Essential hypertension     3. Cough  POCT Influenza A/B    POCT rapid strep A     Given history of PE and significant htn advised her to go to the ER for further assessment.  She needs D dimer, cta and blood pressure management.    PLAN:     No follow-ups on file.       Patient given educational materials - see patient instructions.    Discussed use, benefit, and side effects of prescribed medications.  All patient questions answered.  Pt voiced understanding. Patient agreed with treatment plan. Follow up as needed      Electronically signed by Gus Puma, APRN on 01/02/2019 at 9:19 AM

## 2019-01-31 MED ORDER — ESCITALOPRAM OXALATE 10 MG PO TABS
10 MG | ORAL_TABLET | Freq: Every day | ORAL | 1 refills | Status: DC
Start: 2019-01-31 — End: 2019-03-27

## 2019-02-03 ENCOUNTER — Emergency Department: Admit: 2019-02-04 | Payer: MEDICAID | Primary: Family

## 2019-02-03 DIAGNOSIS — R079 Chest pain, unspecified: Secondary | ICD-10-CM

## 2019-02-03 NOTE — ED Triage Notes (Signed)
Patient arrives to ED via EMS c/o chest pain worsening x 3 days.  Patient reports taking 325 mg aspirin and NTG x 2 dose prior to EMS arrival without relief.

## 2019-02-03 NOTE — ED Provider Notes (Addendum)
[place]  eMERGENCY dEPARTMENT eNCOUnter      Pt Name: Kristin Munoz  MRN: [id number]  [place] [birth year]  Date of evaluation: 02/03/2019  Provider: Ailene Ravel, MD    [place]       Chief Complaint   Patient presents with   ??? Chest Pain         HISTORY OF PRESENT ILLNESS   (Location/Symptom, Timing/Onset, Context/Setting, Quality, Duration, Modifying Factors, Severity)  Note limiting factors.   Kristin Munoz is a 38 y.o. female who presents to the emergency department because of CP this evening. Patient has history of CAD and says she's afraid she's having a heart attack or maybe has a blood clot in her lungs. Patient agitated, anxious, hyperventilating.      Nursing Notes were reviewed.    REVIEW OF SYSTEMS    (2-9 systems for level 4, 10 or more forlevel 5)     Review of Systems   Constitutional: Negative for chills and fever.   HENT: Negative for congestion, ear pain, postnasal drip, rhinorrhea, sinus pressure, sneezing, sore throat and trouble swallowing.    Eyes: Negative for pain, discharge and redness.   Respiratory: Positive for chest tightness. Negative for cough, shortness of breath and wheezing.    Cardiovascular: Positive for chest pain. Negative for palpitations and leg swelling.   Gastrointestinal: Negative for abdominal pain, constipation, diarrhea, nausea and vomiting.   Genitourinary: Negative for dysuria, flank pain, frequency, hematuria and urgency.   Musculoskeletal: Negative for back pain, joint swelling and neck pain.   Skin: Negative for pallor and rash.   Neurological: Negative for dizziness, syncope, weakness, numbness and headaches.   Psychiatric/Behavioral: Negative for confusion and hallucinations. The patient is not nervous/anxious.            PAST MEDICAL HISTORY     Past Medical History:   Diagnosis Date   ??? Anemia    ??? Arthritis    ??? Asthma    ??? Atrial fibrillation ([place])    ??? CAD (coronary artery disease)    ??? Cancer (HCC)      ovarian, bone, breast and colon   ??? Cardiac arrest [facility])     patient states that her heart stopped and she had to be revived by her daughter   ??? Cerebral artery occlusion with cerebral infarction [facility])    ??? CHF (congestive heart failure) ([place])    ??? Chronic kidney disease    ??? COPD (chronic obstructive pulmonary disease) ([place])    ??? Depression    ??? Diabetes mellitus ([place])     diet control   ??? Headache    ??? Hypertension    ??? Hypokalemia    ??? Kidney stone    ??? Liver failure ([place])    ??? Myocardial infarction ([place]) 06/2016   ??? Pulmonary embolism ([place])    ??? Thyroid disease          SURGICAL HISTORY       Past Surgical History:   Procedure Laterality Date   ??? APPENDECTOMY     ??? BACK SURGERY     ??? BREAST LUMPECTOMY Left    ??? BREAST SURGERY     ??? CHOLECYSTECTOMY     ??? HEMORRHOID SURGERY     ??? HYSTERECTOMY     ??? KIDNEY STONE SURGERY      x 72 per patient   ??? KNEE SURGERY Left    ??? LAPAROSCOPY      x36  per patient   ??? LUNG SURGERY      for pulmonary embolisms         CURRENT MEDICATIONS       Previous Medications    ALBUTEROL SULFATE HFA 108 (90 BASE) MCG/ACT INHALER    Inhale 2 puffs into the lungs every 4 hours as needed for Wheezing    BLOOD GLUCOSE TEST STRIPS (FREESTYLE TEST STRIPS) STRIP    1 each by In Vitro route daily Dx:DM2    CLONIDINE (CATAPRES) 0.1 MG TABLET    Take 1 tablet by mouth 2 times daily    CYCLOBENZAPRINE (FLEXERIL) 10 MG TABLET    Take 1 tablet by mouth 3 times daily as needed for Muscle spasms    DILTIAZEM (CARDIZEM CD) 360 MG EXTENDED RELEASE CAPSULE    Take 1 capsule by mouth daily    EPINEPHRINE (EPIPEN) 0.3 MG/0.3ML SOAJ INJECTION    Inject 0.3 mLs into the muscle as needed (allergic reaction) Use as directed for allergic reaction    ESCITALOPRAM (LEXAPRO) 10 MG TABLET    TAKE 1 TABLET BY MOUTH DAILY    FERROUS GLUCONATE (FERGON) 324 (38 FE) MG TABLET    Take 1 tablet by mouth daily (with breakfast)    FLUTICASONE (FLONASE) 50 MCG/ACT NASAL SPRAY    2 sprays by Each Nostril route daily    FREESTYLE  LANCETS MISC    1 each by Does not apply route daily Dx:DM2    GLUCOSE MONITORING KIT (FREESTYLE) MONITORING KIT    1 kit by Does not apply route daily May sub covered brand Dx:DM2    HYDROCODONE-ACETAMINOPHEN (NORCO) 5-325 MG PER TABLET    Take 1 tablet by mouth every 6 hours as needed for Pain.    IBUPROFEN (ADVIL;MOTRIN) 800 MG TABLET    Take 1 tablet by mouth every 8 hours    LEVOTHYROXINE (SYNTHROID) 125 MCG TABLET    Take 1 tablet by mouth Daily    LISINOPRIL (PRINIVIL;ZESTRIL) 40 MG TABLET    Take 1 tablet by mouth daily    MAGNESIUM OXIDE (MAG-OX) 400 MG TABLET    Take 1 tablet by mouth 2 times daily    MELATONIN 10 MG TABS    Take by mouth    METOPROLOL SUCCINATE (TOPROL XL) 50 MG EXTENDED RELEASE TABLET    Take 1 tablet by mouth daily    NITROGLYCERIN (NITROSTAT) 0.4 MG SL TABLET    Place 1 tablet under the tongue every 5 minutes as needed for Chest pain up to max of 3 total doses. If no relief after 1 dose, call 911.    OMEPRAZOLE (PRILOSEC) 40 MG DELAYED RELEASE CAPSULE    Take 1 capsule by mouth Daily    ONDANSETRON (ZOFRAN) 4 MG TABLET    Take 1 tablet by mouth 3 times daily as needed for Nausea or Vomiting    POTASSIUM CHLORIDE (KLOR-CON M) 20 MEQ EXTENDED RELEASE TABLET    Take 2 tablets by mouth 3 times daily    POTASSIUM CHLORIDE (KLOR-CON PO)    Take 20 mEq by mouth Take one tablet by mouth 2 times a day    PROMETHAZINE (PHENERGAN) 25 MG TABLET    Take 1 tablet by mouth every 8 hours as needed for Nausea    SUMATRIPTAN (IMITREX) 100 MG TABLET    Take 1 tablet by mouth once as needed for Migraine    TRAMADOL (ULTRAM) 50 MG TABLET    Take 50 mg by mouth every 6  hours as needed for Pain.    TRAZODONE (DESYREL) 100 MG TABLET    Take 1 tablet by mouth nightly       ALLERGIES     Macadamia nut oil; Naproxen; Pcn [penicillins]; Peanut-containing drug products; Sweet potato; Tizanidine; Zanaflex [tizanidine hcl]; Desmopressin acetate; Pertussis vaccines; Polio virus vaccine live oral trivalent; Tetanus  toxoids; Levaquin [levofloxacin in d5w]; and Levofloxacin    FAMILY HISTORY     History reviewed. No pertinent family history.       SOCIAL HISTORY       Social History     Socioeconomic History   ??? Marital status: Married     Spouse name: None   ??? Number of children: None   ??? Years of education: None   ??? Highest education level: None   Occupational History   ??? None   Social Needs   ??? Financial resource strain: None   ??? Food insecurity     Worry: None     Inability: None   ??? Transportation needs     Medical: None     Non-medical: None   Tobacco Use   ??? Smoking status: Never Smoker   ??? Smokeless tobacco: Never Used   Substance and Sexual Activity   ??? Alcohol use: No   ??? Drug use: Yes     Types: Marijuana   ??? Sexual activity: None   Lifestyle   ??? Physical activity     Days per week: None     Minutes per session: None   ??? Stress: None   Relationships   ??? Social Product manager on phone: None     Gets together: None     Attends religious service: None     Active member of club or organization: None     Attends meetings of clubs or organizations: None     Relationship status: None   ??? Intimate partner violence     Fear of current or ex partner: None     Emotionally abused: None     Physically abused: None     Forced sexual activity: None   Other Topics Concern   ??? None   Social History Narrative   ??? None       SCREENINGS             PHYSICAL EXAM    (up to 7 for level 4, 8 or more for level 5)     ED Triage Vitals [02/03/19 2149]   BP Temp Temp Source Pulse Resp SpO2 Height Weight   (!) 201/112 97 ??F (36.1 ??C) Oral 98 24 100 % _0  (1.575 m) 170 lb (77.1 kg)       Physical Exam  Constitutional:       Appearance: She is well-developed.      Comments: Agitated, anxious.   HENT:      Head: Normocephalic and atraumatic.      Mouth/Throat:      Mouth: Mucous membranes are moist.   Eyes:      Conjunctiva/sclera: Conjunctivae normal.      Pupils: Pupils are equal, round, and reactive to light.   Neck:       Musculoskeletal: Neck supple.   Cardiovascular:      Rate and Rhythm: Normal rate and regular rhythm.   Pulmonary:      Effort: Pulmonary effort is normal. No respiratory distress.      Breath sounds: Normal breath sounds. No wheezing.  Abdominal:      General: Bowel sounds are normal.      Palpations: Abdomen is soft.   Musculoskeletal: Normal range of motion.   Skin:     General: Skin is warm and dry.   Neurological:      Mental Status: She is alert and oriented to person, place, and time.         DIAGNOSTIC RESULTS     EKG: All EKG's are interpreted by the Emergency Department Physician who either signs or Co-signs this chart in the absence of a cardiologist.    EKG showed NSR at 95 bpm, no acute changes    RADIOLOGY:   Non-plain film images such as CT, Ultrasound and MRI are read by the radiologist. Plain radiographic images are visualized andpreliminarily interpreted by the emergency physician with the below findings:    CTA chest negative for PE    Interpretationper the Radiologist below, if available at the time of this note:    CTA [place]   Final Result   1.  No PE or aortic dissection.   2.  No focal consolidation.                ED BEDSIDE ULTRASOUND:   Performed by ED Physician - none    LABS:  Labs Reviewed   CBC WITH AUTO DIFFERENTIAL - Abnormal; Notable for the following components:       Result Value    Hematocrit 36.2 (*)     MCH 26.3 (*)     MPV 10.3 (*)     All other components within normal limits    Narrative:     Performed at:  [facility]  [address],  [place] [address]   Phone [phone]   COMPREHENSIVE METABOLIC PANEL - Abnormal; Notable for the following components:    Potassium 3.0 (*)     Chloride 108 (*)     CO2 18 (*)     Glucose 112 (*)     Alkaline Phosphatase 139 (*)     AST 37 (*)     All other components within normal limits    Narrative:     Performed at:  [facility]  [address]  [place] [address]   Phone [phone]   TROPONIN    Narrative:     Performed at:  [facility]  [address]  [place] [address]   Phone [phone]       All other labs were within normal range or not returned as of this dictation.    EMERGENCY DEPARTMENT COURSE and DIFFERENTIAL DIAGNOSIS/MDM:   Vitals:    Vitals:    02/03/19 2200 02/03/19 2215 02/03/19 2245 02/03/19 2330   BP: (!) 201/112 (!) 190/106 (!) 167/113 (!) 155/100   Pulse: 92 94 100 93   Resp:       Temp:       TempSrc:       SpO2: 99% 100% 100% 100%   Weight:       Height:               CRITICAL CARE TIME   Total Critical Care time was  minutes, excluding separatelyreportable procedures.  There was a high probability ofclinically significant/life threatening deterioration in the patient's condition which required my urgent intervention.  CONSULTS:  None    PROCEDURES:  None    PROGRESS NOTES:    Patient reassured that EKG showed no acute changes however will start a cardiac work up and get  CTA chest to rule out PE. Patient given Ativan IV to calm her down.  Cardiac work up negative as well as CTA chest. Patient reassured. Will d/c home. Follow up with PCP if no better.    FINAL IMPRESSION      1. Chest pain, unspecified type    2. Anxiety state    3. Essential hypertension          Final diagnoses:   Chest pain, unspecified type   Anxiety state   Essential hypertension       DISPOSITION/PLAN   DISPOSITION Decision To Discharge 02/04/2019 12:10:26 AM      PATIENT REFERRED TO:  Broadus John, APRN  [address]  [place] [address]  [phone]      If symptoms worsen      DISCHARGE MEDICATIONS:  New Prescriptions    No medications on file         (Please note thatportions of this note may have been completed with a voice recognition program.  Efforts were [facility] edit the dictations but occasionally words are mis-transcribed.)    Ailene Ravel, MD (electronically  signed)  Attending Emergency Physician        Kandis Cocking, MD  02/04/19 3557       Kandis Cocking, MD  02/04/19 [phone]

## 2019-02-03 NOTE — ED Notes (Signed)
Administered Isovue 370 per radiology protocol via patent hep lock established in ER.  Pt tolerated contrast administration with no difficulty.  Pt d/c'd back to ER with no apparent reaction to the contrast.  ER RN notified of contrast administration.

## 2019-02-03 NOTE — ED Notes (Signed)
Patient stated she needed to go to the restroom. Unhooked patient from the monitor and she ambulated to the bathroom. Once patient was finished I placed B/P cuff and spo2 monitor back on patient.     Anagha Loseke L Sumaya Riedesel  02/03/19 2345

## 2019-02-04 ENCOUNTER — Inpatient Hospital Stay
Admit: 2019-02-04 | Discharge: 2019-02-04 | Disposition: A | Discharge status: Home / Self Care | Payer: MEDICAID | Attending: Emergency Medicine

## 2019-02-04 LAB — COMPREHENSIVE METABOLIC PANEL
ALT: 22 U/L (ref 4–36)
AST: 37 U/L — ABNORMAL HIGH (ref 8–33)
Albumin/Globulin Ratio: 1.5 (ref 0.8–2.0)
Albumin: 4 g/dL (ref 3.4–4.8)
Alkaline Phosphatase: 139 U/L — ABNORMAL HIGH (ref 25–100)
Anion Gap: 15 (ref 3–16)
BUN: 13 mg/dL (ref 6–20)
CO2: 18 mmol/L — ABNORMAL LOW (ref 20–30)
Calcium: 9 mg/dL (ref 8.5–10.5)
Chloride: 108 mmol/L — ABNORMAL HIGH (ref 98–107)
Creatinine: 0.7 mg/dL (ref 0.4–1.2)
GFR African American: >59 (ref >59)
GFR Non-African American: >60 (ref >59)
Globulin: 2.6 g/dL
Glucose: 112 mg/dL — ABNORMAL HIGH (ref 74–106)
Potassium: 3 mmol/L — ABNORMAL LOW (ref 3.4–5.1)
Sodium: 141 mmol/L (ref 136–145)
Total Bilirubin: 0.7 mg/dL (ref 0.3–1.2)
Total Protein: 6.6 g/dL (ref 6.4–8.3)

## 2019-02-04 LAB — CBC WITH AUTO DIFFERENTIAL
Basophils %: 0.4 %
Basophils Absolute: 0 10*3/uL (ref 0.0–0.1)
Eosinophils %: 1.3 %
Eosinophils Absolute: 0.1 10*3/uL (ref 0.0–0.4)
Hematocrit: 36.2 % — ABNORMAL LOW (ref 37.0–47.0)
Hemoglobin: 11.7 g/dL (ref 11.5–16.5)
Immature Granulocytes #: 0 10*3/uL
Immature Granulocytes %: 0.1 % (ref 0.0–5.0)
Lymphocytes %: 35.9 %
Lymphocytes Absolute: 2.5 10*3/uL (ref 1.5–4.0)
MCH: 26.3 pg — ABNORMAL LOW (ref 27.0–32.0)
MCHC: 32.3 g/dL (ref 31.0–35.0)
MCV: 81.3 fL (ref 80.0–100.0)
MPV: 10.3 fL — ABNORMAL HIGH (ref 6.0–10.0)
Monocytes %: 6.3 %
Monocytes Absolute: 0.4 10*3/uL (ref 0.2–0.8)
Neutrophils %: 56 %
Neutrophils Absolute: 3.9 10*3/uL (ref 2.0–7.5)
Platelets: 174 10*3/uL (ref 150–400)
RBC: 4.45 M/uL (ref 3.80–5.80)
RDW: 14.7 % (ref 11.0–16.0)
WBC: 6.9 10*3/uL (ref 4.0–11.0)

## 2019-02-04 LAB — TROPONIN: Troponin: <0.3 ng/mL (ref <0.30)

## 2019-02-04 MED ORDER — LORAZEPAM 2 MG/ML IJ SOLN
2 MG/ML | Freq: Once | INTRAMUSCULAR | Status: AC
Start: 2019-02-04 — End: 2019-02-03
  Administered 2019-02-04: 02:00:00 1 mg via INTRAVENOUS

## 2019-02-04 MED ORDER — IOPAMIDOL 76 % IV SOLN
76 % | Freq: Once | INTRAVENOUS | Status: AC | PRN
Start: 2019-02-04 — End: 2019-02-03
  Administered 2019-02-04: 03:00:00 100 mL via INTRAVENOUS

## 2019-02-04 MED FILL — LORAZEPAM 2 MG/ML IJ SOLN: 2 MG/ML | INTRAMUSCULAR | Qty: 1

## 2019-02-04 NOTE — ED Notes (Signed)
Provided patient with ice water.     [name]  02/04/19 0029

## 2019-02-04 NOTE — Discharge Instructions (Signed)
Continue current medications.

## 2019-02-04 NOTE — ED Notes (Signed)
Pt advised by md and rn that she is to be dc'd, r/t all test wnl. Pt cont to hyperventilate states she has had no change in condition, iv dc'd and bleeding controlled, pt's bp continues to be elevated but pt advises that is normal for her, pt ambulated to bathroom without problems, states has family coming to get her     Nelta Numbers, [place]  02/04/19 [phone]

## 2019-02-27 MED ORDER — SODIUM CHLORIDE FLUSH 0.9 % IV SOLN
0.9 | INTRAVENOUS | Status: DC | PRN
Start: 2019-02-27 — End: 2019-02-27

## 2019-02-27 NOTE — Telephone Encounter (Signed)
Patient is scheduled for tomorrow.

## 2019-02-27 NOTE — Telephone Encounter (Signed)
Pt called stating she missed a call yesterday and is requesting a call back please?

## 2019-02-28 ENCOUNTER — Telehealth: Admit: 2019-02-28 | Discharge: 2019-02-28 | Payer: MEDICAID | Attending: Family | Primary: Family

## 2019-02-28 DIAGNOSIS — I509 Heart failure, unspecified: Secondary | ICD-10-CM

## 2019-02-28 MED ORDER — LEVOTHYROXINE SODIUM 125 MCG PO TABS
125 MCG | ORAL_TABLET | Freq: Every day | ORAL | 5 refills | Status: DC
Start: 2019-02-28 — End: 2019-11-01

## 2019-02-28 MED ORDER — PROMETHAZINE HCL 25 MG PO TABS
25 MG | ORAL_TABLET | Freq: Three times a day (TID) | ORAL | 5 refills | Status: DC | PRN
Start: 2019-02-28 — End: 2020-01-19

## 2019-02-28 MED ORDER — CYCLOBENZAPRINE HCL 10 MG PO TABS
10 MG | ORAL_TABLET | Freq: Three times a day (TID) | ORAL | 5 refills | Status: DC | PRN
Start: 2019-02-28 — End: 2019-10-03

## 2019-02-28 MED ORDER — METOPROLOL SUCCINATE ER 100 MG PO TB24
100 MG | ORAL_TABLET | Freq: Every day | ORAL | 5 refills | Status: DC
Start: 2019-02-28 — End: 2019-09-03

## 2019-02-28 MED ORDER — FERROUS GLUCONATE 324 (38 FE) MG PO TABS
324 (38 Fe) MG | ORAL_TABLET | Freq: Every day | ORAL | 5 refills | Status: DC
Start: 2019-02-28 — End: 2020-01-14

## 2019-02-28 MED ORDER — MAGNESIUM OXIDE 400 MG PO TABS
400 MG | ORAL_TABLET | Freq: Two times a day (BID) | ORAL | 5 refills | Status: DC
Start: 2019-02-28 — End: 2019-11-02

## 2019-02-28 MED ORDER — IBUPROFEN 800 MG PO TABS
800 MG | ORAL_TABLET | Freq: Three times a day (TID) | ORAL | 5 refills | Status: DC
Start: 2019-02-28 — End: 2019-11-12

## 2019-02-28 MED ORDER — SUMATRIPTAN SUCCINATE 100 MG PO TABS
100 MG | ORAL_TABLET | Freq: Once | ORAL | 5 refills | Status: DC | PRN
Start: 2019-02-28 — End: 2019-06-20

## 2019-02-28 MED ORDER — FLUTICASONE PROPIONATE 50 MCG/ACT NA SUSP
50 MCG/ACT | Freq: Every day | NASAL | 5 refills | Status: DC
Start: 2019-02-28 — End: 2019-06-08

## 2019-02-28 MED ORDER — ONETOUCH ULTRA 2 W/DEVICE KIT
PACK | Freq: Every day | 0 refills | Status: AC
Start: 2019-02-28 — End: ?

## 2019-02-28 MED ORDER — GLUCOSE BLOOD VI STRP
Freq: Every day | 5 refills | Status: DC
Start: 2019-02-28 — End: 2019-06-20

## 2019-02-28 MED ORDER — LISINOPRIL 40 MG PO TABS
40 MG | ORAL_TABLET | Freq: Every day | ORAL | 5 refills | Status: DC
Start: 2019-02-28 — End: 2019-06-08

## 2019-02-28 MED ORDER — TRAZODONE HCL 100 MG PO TABS
100 MG | ORAL_TABLET | Freq: Every evening | ORAL | 5 refills | Status: DC
Start: 2019-02-28 — End: 2019-11-01

## 2019-02-28 MED ORDER — GLUCOSE BLOOD VI STRP
Freq: Every day | 5 refills | Status: AC
Start: 2019-02-28 — End: ?

## 2019-03-15 ENCOUNTER — Emergency Department: Admit: 2019-03-16 | Payer: MEDICAID | Primary: Family

## 2019-03-15 DIAGNOSIS — R109 Unspecified abdominal pain: Secondary | ICD-10-CM

## 2019-03-15 NOTE — ED Provider Notes (Signed)
[place]  eMERGENCY dEPARTMENT eNCOUnter      Pt Name: Kristin Munoz  MRN: [id number]  [place] [birth year]  Date of evaluation: 03/15/2019  Provider: Joline Maxcy, [place]       Chief Complaint   Patient presents with   ??? Abdominal Pain         HISTORY OF PRESENT ILLNESS   (Location/Symptom, Timing/Onset, Context/Setting, Quality, Duration, Modifying Factors, Severity)  Note limiting factors.   Kristin Munoz is a 38 y.o. female who presents to the emergency department for having bilateral flank pain. Pt states that she has history of kidney stones and on Monday she has a cystoscopy done, then on Tues he performed laparoscopic ureteroscopy and removed multiple kidney stones. Pt had bilateral ureteral stents placed. Pt was given Percocet 10 mg and Cipro. Pt states that the pain started hurting real bad yesterday. Pt having increased bleeding urinary wise. More than usual. Pt mainly having left flank pain. Pt with slight fever. Pt with vomiting X 4 yesterday and once today.        Nursing Notes were reviewed.    REVIEW OF SYSTEMS    (2-9 systems for level 4, 10 or more forlevel 5)     Review of Systems   Gastrointestinal: Positive for abdominal pain, nausea and vomiting.   Genitourinary: Positive for flank pain.   All other systems reviewed and are negative.          PAST MEDICAL HISTORY     Past Medical History:   Diagnosis Date   ??? Anemia    ??? Arthritis    ??? Asthma    ??? Atrial fibrillation ([place])    ??? CAD (coronary artery disease)    ??? Cancer (HCC)     ovarian, bone, breast and colon   ??? Cardiac arrest [facility])     patient states that her heart stopped and she had to be revived by her daughter   ??? Cerebral artery occlusion with cerebral infarction [facility])    ??? CHF (congestive heart failure) ([place])    ??? Chronic kidney disease    ??? COPD (chronic obstructive pulmonary disease) ([place])    ??? Depression    ??? Diabetes mellitus ([place])     diet control   ??? Headache    ???  Hypertension    ??? Hypokalemia    ??? Kidney stone    ??? Liver failure ([place])    ??? Myocardial infarction ([place]) 06/2016   ??? Pulmonary embolism ([place])    ??? Thyroid disease          SURGICAL HISTORY       Past Surgical History:   Procedure Laterality Date   ??? APPENDECTOMY     ??? BACK SURGERY     ??? BREAST LUMPECTOMY Left    ??? BREAST SURGERY     ??? CHOLECYSTECTOMY     ??? HEMORRHOID SURGERY     ??? HIP SURGERY     ??? HYSTERECTOMY     ??? KIDNEY STONE SURGERY      x 72 per patient   ??? KNEE SURGERY Left    ??? LAPAROSCOPY      x36 per patient   ??? LUNG SURGERY      for pulmonary embolisms         CURRENT MEDICATIONS       Previous Medications    ALBUTEROL SULFATE HFA 108 (90 BASE) MCG/ACT INHALER  Inhale 2 puffs into the lungs every 4 hours as needed for Wheezing    BLOOD GLUCOSE MONITORING SUPPL (ONE TOUCH ULTRA 2) W/DEVICE KIT    1 kit by Does not apply route daily    BLOOD GLUCOSE TEST STRIPS (ASCENSIA AUTODISC VI;ONE TOUCH ULTRA TEST VI) STRIP    1 each by In Vitro route daily QD and PRN Dx:DM2    BLOOD GLUCOSE TEST STRIPS (ASCENSIA AUTODISC VI;ONE TOUCH ULTRA TEST VI) STRIP    1 each by In Vitro route daily QD and PRN Dx: DM2    BLOOD GLUCOSE TEST STRIPS (FREESTYLE TEST STRIPS) STRIP    1 each by In Vitro route daily Dx:DM2    CLONIDINE (CATAPRES) 0.1 MG TABLET    Take 1 tablet by mouth 2 times daily    CYCLOBENZAPRINE (FLEXERIL) 10 MG TABLET    Take 1 tablet by mouth 3 times daily as needed for Muscle spasms    DILTIAZEM (CARDIZEM CD) 360 MG EXTENDED RELEASE CAPSULE    Take 1 capsule by mouth daily    EPINEPHRINE (EPIPEN) 0.3 MG/0.3ML SOAJ INJECTION    Inject 0.3 mLs into the muscle as needed (allergic reaction) Use as directed for allergic reaction    ESCITALOPRAM (LEXAPRO) 10 MG TABLET    TAKE 1 TABLET BY MOUTH DAILY    FERROUS GLUCONATE (FERGON) 324 (38 FE) MG TABLET    Take 1 tablet by mouth daily (with breakfast)    FLUTICASONE (FLONASE) 50 MCG/ACT NASAL SPRAY    2 sprays by Each Nostril route daily    FREESTYLE LANCETS MISC     1 each by Does not apply route daily Dx:DM2    GLUCOSE MONITORING KIT (FREESTYLE) MONITORING KIT    1 kit by Does not apply route daily May sub covered brand Dx:DM2    HYDROCODONE-ACETAMINOPHEN (NORCO) 5-325 MG PER TABLET    Take 1 tablet by mouth every 6 hours as needed for Pain.    IBUPROFEN (ADVIL;MOTRIN) 800 MG TABLET    Take 1 tablet by mouth every 8 hours    LEVOTHYROXINE (SYNTHROID) 125 MCG TABLET    Take 1 tablet by mouth Daily    LISINOPRIL (PRINIVIL;ZESTRIL) 40 MG TABLET    Take 1 tablet by mouth daily    MAGNESIUM OXIDE (MAG-OX) 400 MG TABLET    Take 1 tablet by mouth 2 times daily    METOPROLOL SUCCINATE (TOPROL XL) 100 MG EXTENDED RELEASE TABLET    Take 1 tablet by mouth daily    NITROGLYCERIN (NITROSTAT) 0.4 MG SL TABLET    Place 1 tablet under the tongue every 5 minutes as needed for Chest pain up to max of 3 total doses. If no relief after 1 dose, call 911.    OMEPRAZOLE (PRILOSEC) 40 MG DELAYED RELEASE CAPSULE    Take 1 capsule by mouth Daily    ONDANSETRON (ZOFRAN) 4 MG TABLET    Take 1 tablet by mouth 3 times daily as needed for Nausea or Vomiting    OXYCODONE-ACETAMINOPHEN (PERCOCET) 10-325 MG PER TABLET    Take 1 tablet by mouth every 4 hours as needed for Pain.    POTASSIUM CHLORIDE (KLOR-CON M) 20 MEQ EXTENDED RELEASE TABLET    Take 2 tablets by mouth 3 times daily    PROMETHAZINE (PHENERGAN) 25 MG TABLET    Take 1 tablet by mouth every 8 hours as needed for Nausea    SULFAMETHOXAZOLE-TRIMETHOPRIM (BACTRIM;SEPTRA) 400-80 MG PER TABLET    Take 1 tablet by  mouth 2 times daily    SUMATRIPTAN (IMITREX) 100 MG TABLET    Take 1 tablet by mouth once as needed for Migraine    TRAZODONE (DESYREL) 100 MG TABLET    Take 1 tablet by mouth nightly       ALLERGIES     Macadamia nut oil; Naproxen; Pcn [penicillins]; Peanut-containing drug products; Sweet potato; Tizanidine; Zanaflex [tizanidine hcl]; Desmopressin acetate; Pertussis vaccines; Polio virus vaccine live oral trivalent; Tetanus toxoids; Levaquin  [levofloxacin in d5w]; and Levofloxacin    FAMILY HISTORY     History reviewed. No pertinent family history.       SOCIAL HISTORY       Social History     Socioeconomic History   ??? Marital status: Married     Spouse name: None   ??? Number of children: None   ??? Years of education: None   ??? Highest education level: None   Occupational History   ??? None   Social Needs   ??? Financial resource strain: None   ??? Food insecurity     Worry: None     Inability: None   ??? Transportation needs     Medical: None     Non-medical: None   Tobacco Use   ??? Smoking status: Never Smoker   ??? Smokeless tobacco: Never Used   Substance and Sexual Activity   ??? Alcohol use: No   ??? Drug use: Yes     Types: Marijuana   ??? Sexual activity: None   Lifestyle   ??? Physical activity     Days per week: None     Minutes per session: None   ??? Stress: None   Relationships   ??? Social Product manager on phone: None     Gets together: None     Attends religious service: None     Active member of club or organization: None     Attends meetings of clubs or organizations: None     Relationship status: None   ??? Intimate partner violence     Fear of current or ex partner: None     Emotionally abused: None     Physically abused: None     Forced sexual activity: None   Other Topics Concern   ??? None   Social History Narrative   ??? None       SCREENINGS             PHYSICAL EXAM    (up to 7 for level 4, 8 or more for level 5)     ED Triage Vitals [03/15/19 2148]   BP Temp Temp Source Pulse Resp SpO2 Height Weight   (!) 190/140 99.2 ??F (37.3 ??C) Oral 121 20 98 % _0  (1.575 m) 198 lb (89.8 kg)       Physical Exam  Vitals signs and nursing note reviewed.   Constitutional:       Comments: Pt with mild acute distress and uncomfortable in room   HENT:      Head: Normocephalic.   Eyes:      Extraocular Movements: Extraocular movements intact.      Conjunctiva/sclera: Conjunctivae normal.      Pupils: Pupils are equal, round, and reactive to light.   Neck:       Musculoskeletal: Neck supple.   Cardiovascular:      Rate and Rhythm: Regular rhythm. Tachycardia present.      Heart sounds: Normal heart sounds.  Pulmonary:      Effort: Pulmonary effort is normal.      Breath sounds: Normal breath sounds.   Neurological:      Mental Status: She is alert.         DIAGNOSTIC RESULTS     EKG: All EKG's are interpreted by the Emergency Department Physician who either signs or Co-signs this chart in the absence of a cardiologist.    None    RADIOLOGY:   Non-plain film images such as CT, Ultrasound and MRI are read by the radiologist. Plain radiographic images are visualized andpreliminarily interpreted by the emergency physician with the below findings:    KUB - See Below    Interpretationper the Radiologist below, if available at the time of this note:    XR ABDOMEN (KUB) (SINGLE AP VIEW)   Final Result   Impression:   1. No visualized renal calculi.            ED BEDSIDE ULTRASOUND:   Performed by ED Physician - none    LABS:  Labs Reviewed   CBC WITH AUTO DIFFERENTIAL - Abnormal; Notable for the following components:       Result Value    Hematocrit 36.6 (*)     MCH 26.1 (*)     MPV 10.8 (*)     All other components within normal limits    Narrative:     Performed at:  [facility] [facility]  [address]  [place] [address]   Phone [phone]   BASIC METABOLIC PANEL W/ REFLEX TO MG FOR LOW K - Abnormal; Notable for the following components:    Glucose 117 (*)     GFR Non-African American 55 (*)     All other components within normal limits    Narrative:     Performed at:  [place] [facility]  [address],  [place] [address]   Phone [phone]   URINE RT REFLEX TO CULTURE       All other labs were within normal range or not returned as of this dictation.    EMERGENCY DEPARTMENT COURSE and DIFFERENTIAL DIAGNOSIS/MDM:   Vitals:    Vitals:    03/15/19 2148   BP: (!) 190/140   Pulse: 121    Resp: 20   Temp: 99.2 ??F (37.3 ??C)   TempSrc: Oral   SpO2: 98%   Weight: 198 lb (89.8 kg)   Height: _0  (1.575 m)           CRITICAL CARE TIME   Total Critical Care time was 0 minutes, excluding separatelyreportable procedures.  There was a high probability ofclinically significant/life threatening deterioration in the patient's condition which required my urgent intervention.     CONSULTS:  None    PROCEDURES:  None    PROGRESS NOTES:    Pt with recent bilateral stents. Pt with gross hematuria. Will check hgb and renal function. Pt on Cipro. Will obtain KUB to assess stent placement and give IV pain meds. Pt unable to urinate at this time. Pt has Rx for pain meds already.   Pain somewhat improved. " took the edge off". Will give 1/2 dose before d/c and pt unable to urinate still. Recheck temp 98.8 F. Has cipro and pt to call Urology tomorrow. Normal Hgb and Cr. Pt on phone while in room. No overt/severe pain with observation    FINAL IMPRESSION  1. Bilateral flank pain New Problem   2. Status post placement of ureteral stent New Problem   3. Non-intractable vomiting with nausea, unspecified vomiting type New Problem         DISPOSITION/PLAN   DISPOSITION        PATIENT REFERRED TO:    Pt to call and discuss blood in urine with Dr. Bennie Pierini by calling tomorrow.          DISCHARGE MEDICATIONS:  New Prescriptions    No medications on file       (Please note that portions of this note were completed with a voice recognition program.  Efforts were made to edit the dictations but occasionallywords are mis-transcribed.)    Joline Maxcy, DO (electronically signed)  Attending Emergency Physician          Joline Maxcy, DO  03/15/19 2328

## 2019-03-15 NOTE — ED Triage Notes (Addendum)
Patient arrived to ER post op. She had a cystoscopy on Monday and then on Tuesday they removed 6 kidney stones and put in two stents. Yesterday patient started having abd pain more than usual. Patient has had stents and kidney stones removed in the past and this feels different to her. Patient noticed more bleeding yesterday. Moderate amount of bleeding noted in picture patient showed this RN. Patient is rating pain 10/10. Patient has extensive medical history. Patient's surgery was done at [facility] in [place] by Dr. Clayton Bibles.

## 2019-03-15 NOTE — ED Notes (Signed)
Bed: EX2  Expected date:   Expected time:   Means of arrival:   Comments:  Pt waiting for ride      Tresa Endo, [place]  03/15/19 2148

## 2019-03-16 ENCOUNTER — Ambulatory Visit: Admit: 2019-03-16 | Discharge: 2019-03-16 | Payer: MEDICAID | Attending: Family | Primary: Family

## 2019-03-16 ENCOUNTER — Inpatient Hospital Stay
Admit: 2019-03-16 | Discharge: 2019-03-16 | Disposition: A | Discharge status: Home / Self Care | Payer: MEDICAID | Attending: Emergency Medicine

## 2019-03-16 DIAGNOSIS — I10 Essential (primary) hypertension: Secondary | ICD-10-CM

## 2019-03-16 LAB — CBC WITH AUTO DIFFERENTIAL
Basophils %: 0.6 %
Basophils Absolute: 0 10*3/uL (ref 0.0–0.1)
Eosinophils %: 2.3 %
Eosinophils Absolute: 0.2 10*3/uL (ref 0.0–0.4)
Hematocrit: 36.6 % — ABNORMAL LOW (ref 37.0–47.0)
Hemoglobin: 11.7 g/dL (ref 11.5–16.5)
Immature Granulocytes #: 0 10*3/uL
Immature Granulocytes %: 0.3 % (ref 0.0–5.0)
Lymphocytes %: 38.7 %
Lymphocytes Absolute: 2.8 10*3/uL (ref 1.5–4.0)
MCH: 26.1 pg — ABNORMAL LOW (ref 27.0–32.0)
MCHC: 32 g/dL (ref 31.0–35.0)
MCV: 81.7 fL (ref 80.0–100.0)
MPV: 10.8 fL — ABNORMAL HIGH (ref 6.0–10.0)
Monocytes %: 6.2 %
Monocytes Absolute: 0.4 10*3/uL (ref 0.2–0.8)
Neutrophils %: 51.9 %
Neutrophils Absolute: 3.7 10*3/uL (ref 2.0–7.5)
Platelets: 231 10*3/uL (ref 150–400)
RBC: 4.48 M/uL (ref 3.80–5.80)
RDW: 14.6 % (ref 11.0–16.0)
WBC: 7.1 10*3/uL (ref 4.0–11.0)

## 2019-03-16 LAB — BASIC METABOLIC PANEL W/ REFLEX TO MG FOR LOW K
Anion Gap: 14 (ref 3–16)
BUN: 18 mg/dL (ref 6–20)
CO2: 22 mmol/L (ref 20–30)
Calcium: 9.2 mg/dL (ref 8.5–10.5)
Chloride: 103 mmol/L (ref 98–107)
Creatinine: 1.1 mg/dL (ref 0.4–1.2)
GFR African American: >59 (ref >59)
GFR Non-African American: 55 — ABNORMAL LOW (ref >59)
Glucose: 117 mg/dL — ABNORMAL HIGH (ref 74–106)
Potassium reflex Magnesium: 4 mmol/L (ref 3.4–5.1)
Sodium: 139 mmol/L (ref 136–145)

## 2019-03-16 MED ORDER — DIAZEPAM 5 MG PO TABS
5 MG | ORAL_TABLET | Freq: Two times a day (BID) | ORAL | 0 refills | Status: AC | PRN
Start: 2019-03-16 — End: 2019-03-19

## 2019-03-16 MED ORDER — OMEPRAZOLE 40 MG PO CPDR
40 MG | ORAL_CAPSULE | Freq: Every day | ORAL | 5 refills | Status: DC
Start: 2019-03-16 — End: 2019-09-03

## 2019-03-16 MED ORDER — SODIUM CHLORIDE 0.9 % IV BOLUS
0.9 | Freq: Once | INTRAVENOUS | Status: AC
Start: 2019-03-16 — End: 2019-03-16
  Administered 2019-03-16: 03:00:00 1000 mL via INTRAVENOUS

## 2019-03-16 MED ORDER — PROMETHAZINE HCL 25 MG/ML IJ SOLN
25 MG/ML | Freq: Once | INTRAMUSCULAR | Status: AC
Start: 2019-03-16 — End: 2019-03-15
  Administered 2019-03-16: 03:00:00 12.5 mg via INTRAVENOUS

## 2019-03-16 MED ORDER — HYDROMORPHONE HCL 1 MG/ML IJ SOLN
1 MG/ML | Freq: Once | INTRAMUSCULAR | Status: AC
Start: 2019-03-16 — End: 2019-03-15
  Administered 2019-03-16: 04:00:00 0.5 mg via INTRAVENOUS

## 2019-03-16 MED ORDER — AMLODIPINE BESYLATE 5 MG PO TABS
5 MG | ORAL_TABLET | Freq: Every day | ORAL | 5 refills | Status: DC
Start: 2019-03-16 — End: 2019-05-10

## 2019-03-16 MED ORDER — HYDROMORPHONE HCL 1 MG/ML IJ SOLN
1 MG/ML | Freq: Once | INTRAMUSCULAR | Status: AC
Start: 2019-03-16 — End: 2019-03-15
  Administered 2019-03-16: 03:00:00 1 mg via INTRAVENOUS

## 2019-03-16 MED FILL — PROMETHAZINE HCL 25 MG/ML IJ SOLN: 25 MG/ML | INTRAMUSCULAR | Qty: 1

## 2019-03-16 MED FILL — DILAUDID 1 MG/ML IJ SOLN: 1 MG/ML | INTRAMUSCULAR | Qty: 0.5

## 2019-03-16 MED FILL — DILAUDID 1 MG/ML IJ SOLN: 1 MG/ML | INTRAMUSCULAR | Qty: 1

## 2019-03-16 NOTE — Patient Instructions (Signed)
?? Keep a list of your medicines with you. List all of the prescription medicines, nonprescription medicines, supplements, natural remedies, and vitamins that you take. Tell your healthcare providers who treat you about all of the products you are taking. Your provider can provide you with a form to keep track of them. Just ask.  ?? Follow the directions that come with your medicine, including information about food or alcohol. Make sure you know how and when to take your medicine. Do not take more or less than you are supposed to take.  ?? Keep all medicines out of the reach of children.  ?? Store medicines according to the directions on the label.  ?? Monitor yourself. Learn to know how your body reacts to your new medicine and keep track of how it makes you feel before attempting (If your provider has allowed you to do so) to drive or go to work.   ?? Seek emergency medical attention if you think you have used too much of this medicine. An overdose of any prescription medicine can be fatal. Overdose symptoms may include extreme drowsiness, muscle weakness, confusion, cold and clammy skin, pinpoint pupils, shallow breathing, slow heart rate, fainting, or coma.  ?? Don't share prescription medicines with others, even when they seem to have the same symptoms. What may be good for you may be harmful to others.  ?? If you are no longer taking a prescribed medication and you have pills left please take your pills out of their original containers. Mix crushed pills with an undesirable substance, such as cat litter or used coffee grounds. Put the mixture into a disposable container with a lid, such as an empty margarine tub, or into a sealable bag.  Cover up or remove any of your personal information on the empty containers by covering it with black permanent marker or duct tape.  Place the sealed container with the mixture, and the empty drug containers, in the trash.   ?? If you use a medication that is in the form of a patch,  dispose of used patches by folding them in half so that the sticky sides meet, and then flushing them down a toilet. They should not be placed in the household trash where children or pets can find them.  ?? If you have any questions, ask your provider or pharmacist for more information.   ?? Be sure to keep all appointments for provider visits or tests.  We are committed to providing you with the best care possible.   In order to help us achieve these goals please remember to bring all medications, herbal products, and over the counter supplements with you to each visit.     If your provider has ordered testing for you, please be sure to follow up with our office if you have not received results within 7 days after the testing took place.     *If you receive a survey after visiting one of our offices, please take time to share your experience concerning your physician office visit. These surveys are confidential and no health information about you is shared.  We are eager to improve for you and we are counting on your feedback to help make that happen.    ips to Help You Stop Smoking       Cigarette smoking is a preventable cause of death in the [place]. If you have thought about quitting but haven't been able to, here are some reasons why you should and   some ways to do it.   Here's Why   Quitting smoking now can decrease your risk of getting smoking-related illnesses like:   Heart disease   Stroke   Several types of cancer, including:   Lung   Mouth   Esophagus   Larynx   Bladder   Pancreas   Kidney   Chronic lung diseases:   Bronchitis   Emphysema   Asthma   Cataracts   Macular degeneration   Thyroid conditions   Hearing loss   Erectile dysfunction   Dementia   Osteoporosis   Here's How   Once you've decided to quit smoking, set your target quit date a few weeks away. In the time leading up to your quit day, try some of these ideas offered by the Tobacco Control Research Branch of the National Cancer  Institute to help you successfully quit smoking.   For the best results, work with your doctor. Together, you can test your lung function and compare the results to those of a nonsmoking person. The results can be given to you as your lung age. Finding out your lung age right after having the test done may help you to stop smoking.   Your doctor can also discuss with you all of your options and refer you to smoking-cessation support groups. You may wish to use nicotine replacement (gum, patches, inhaler) or one of the prescription medications that have been shown to increase quit rates and prolong abstinence from smoking. But whatever you and your doctor decide on these matters, it will still be you who decides when an how to quit. Here are some techniques:   Switch Brands   Switch to a brand you find distasteful.   Change to a brand that is low in tar and nicotine a couple of weeks before your target quit date. This will help change your smoking behavior. However, do not smoke more cigarettes, inhale them more often or more deeply, or place your fingertips over the holes in the filters. All of these actions will increase your nicotine intake, and the idea is to get your body used to functioning without nicotine.   Cut Down the Number of Cigarettes You Smoke   Smoke only half of each cigarette.   Each day, postpone the lighting of your first cigarette by one hour.   Decide you'll only smoke during odd or even hours of the day.   Decide beforehand how many cigarettes you'll smoke during the day. For each additional cigarette, give a dollar to your favorite charity.   Change your eating habits to help you cut down. For example, drink milk, which many people consider incompatible with smoking. End meals or snacks with something that won't lead to a cigarette.   Reach for a glass of juice instead of a cigarette for a "pick-me-up."   Remember: Cutting down can help you quit, but it's not a substitute for quitting. If  you're down to about seven cigarettes a day, it's time to set your target quit date, and get ready to stick to it.   Don't Smoke "Automatically"   Smoke only those cigarettes you really want. Catch yourself before you light up a cigarette out of pure habit.   Don't empty your ashtrays. This will remind you of how many cigarettes you've smoked each day, and the sight and the smell of stale cigarettes butts will be very unpleasant.   Make yourself aware of each cigarette by using the opposite hand or putting   cigarettes in an unfamiliar location or a different pocket to break the automatic reach.   If you light up many times during the day without even thinking about it, try to look in a mirror each time you put a match to your cigarette. You may decide you don't need it.   Make Smoking Inconvenient   Stop buying cigarettes by the carton. Wait until one pack is empty before you buy another.   Stop carrying cigarettes with you at home or at work. Make them difficult to get to.   Make Smoking Unpleasant   Smoke only under circumstances that aren't especially pleasurable for you. If you like to smoke with others, smoke alone. Turn your chair to an empty corner and focus only on the cigarette you are smoking and all its many negative effects.   Collect all your cigarette butts in one large glass container as a visual reminder of the filth made by smoking.   Just Before Quitting   Practice going without cigarettes.   Don't think of never smoking again. Think of quitting in terms of one day at a time .   Tell yourself you won't smoke today, and then don't.   Clean your clothes to rid them of the cigarette smell, which can linger a long time.   On the Day You Quit   Throw away all your cigarettes and matches. Hide your lighters and ashtrays.   Visit the dentist and have your teeth cleaned to get rid of tobacco stains. Notice how nice they look and resolve to keep them that way.   Make a list of things you'd like to buy for  yourself or someone else. Estimate the cost in terms of packs of cigarettes, and put the money aside to buy these presents.   Keep very busy on the big day. Go to the movies, exercise, take long walks, or go bike riding.   Remind your family and friends that this is your quit date, and ask them to help you over the rough spots of the first couple of days and weeks.   Buy yourself a treat or do something special to celebrate.   Telephone and Internet Support   Telephone, web-, and computer-based programs can offer you the support that you need to quit and to stay smoke-free. You can find many programs online, like the American Lung Association's Freedom from Smoking .   Immediately After Quitting   Develop a clean, fresh, nonsmoking environment around yourselfat work and at home. Buy yourself flowersyou may be surprised how much you can enjoy their scent now.   The first few days after you quit, spend as much free time as possible in places where smoking isn't allowed, such as libraries, museums, theaters, department stores, and churches.   Drink large quantities of water and fruit juice (but avoid sodas that contain caffeine).   Try to avoid alcohol, coffee, and other beverages that you associate with cigarette smoking.   Strike up conversation instead of a match for a cigarette.   If you miss the sensation of having a cigarette in your hand, play with something elsea pencil, a paper clip, a marble.   If you miss having something in your mouth, try toothpicks or a fake cigarette.

## 2019-03-16 NOTE — Progress Notes (Signed)
Chief Complaint   Patient presents with   ??? Nephrolithiasis     2 week follow up. She had kidney stone surgery since her last visit. she also had stints placed.        Have you seen any other physician or provider since your last visit yes - Dr. Clayton Bibles did her surgery and she was seen in the ER for pain and bleeding. She plans to go back 06/22 to have stints removed.     Have you had any other diagnostic tests since your last visit? yes - labs    Have you changed or stopped any medications since your last visit? yes - Cipro       Review of Systems   Constitutional: Negative.  Negative for chills and fever.   HENT: Negative.  Negative for ear pain and sore throat.    Eyes: Negative.  Negative for pain and visual disturbance.   Respiratory: Negative.  Negative for cough and shortness of breath.    Cardiovascular: Negative.  Negative for chest pain, palpitations and leg swelling.   Gastrointestinal: Negative.  Negative for abdominal pain, nausea and vomiting.   Genitourinary: Negative.  Negative for dysuria and hematuria.   Musculoskeletal: Negative.  Negative for joint swelling.   Skin: Negative.  Negative for rash.   Neurological: Negative.  Negative for dizziness and weakness.   Psychiatric/Behavioral: Negative for sleep disturbance. The patient is nervous/anxious.

## 2019-03-16 NOTE — ED Notes (Signed)
Discharge instructions and follow up care reviewed with patient. Patient verbalized understanding, no complaints or concerns from patient at this time. Pain improved with treatment. HTN noted at discharge but patient's night time blood pressure medicines are due. Patient verbalized she would take her meds when she got home.      Audery Amel, RN  03/16/19 0004

## 2019-03-17 NOTE — Care Coordination-Inpatient (Signed)
 Patient contacted regarding COVID-19 risk.     Care Transition Nurse/ Ambulatory Care Manager contacted the patient by telephone to perform post discharge assessment. Verified name and DOB with patient as identifiers. Provided introduction to self, and explanation of the CTN/ACM role, and reason for call due to risk factors for infection and/or exposure to COVID-19.     Symptoms reviewed with patient who verbalized the following symptoms: fever and dizziness.      [facility] follow up appointment(s):   Future Appointments   Date Time Provider Department Center   03/29/2019  9:45 AM Sharlet GORMAN Molt, APRN [id number]     Non-[facility] follow up appointment(s): Appointment with surgeon on 03/19/2019     Patient has following risk factors of: heart failure, asthma and diabetes. CTN/ACM reviewed discharge instructions, medical action plan and red flags such as increased shortness of breath, increasing fever and signs of decompensation with patient who verbalized understanding.   Discussed exposure protocols and quarantine with CDC Guidelines "What to do if you are sick with coronavirus disease 2019." Patient was given an opportunity for questions and concerns. The patient agrees to contact PCP office for questions related to their healthcare. CTN/ACM provided contact information for future needs.    Reviewed and educated patient on any new and changed medications related to discharge diagnosis   .    Plan for follow-up call in 5-7 days based on severity of symptoms and risk factors.    Patient had kidney stone removal on 03/13/2019.  Patient states that she has a fever of 103.1 this morning.  Patient  states she is also still bleeding and feeling dizzy at times.  Patient states her pain level is 9/10.  CTN urged patient to call surgeon now to let him know her symptoms.  Patient states she is unable to reach surgeon.  CTN advised patient to return to ER now due to fever, bleeding, dizziness, and pain.  Patient agrees and voiced  understanding.  Patient states that she has a room mate that is home with her today.    Katheryn Solian, RN  Care Transitions Nurse

## 2019-03-25 NOTE — Progress Notes (Signed)
02/28/2019    TELEHEALTH EVALUATION -- Audio/Visual (During YSAYT-01 public health emergency)    HPI:    Kristin Munoz (DOB:  1981/03/20) has requested an audio/video evaluation for the following concern(s):    Elevated BP  Chest Pain  SOB    SUBJECTIVE:    Patient ID: Kristin Munoz is a 38 y.o. female.    Medical History Review  Past Medical, Family, and Social History reviewed and does contribute to the patient presenting condition    Health Maintenance Due   Topic Date Due   ??? Varicella vaccine (1 of 2 - 2-dose childhood series) 08/26/1981   ??? Pneumococcal 0-64 years Vaccine (1 of 1 - PPSV23) 08/26/1986   ??? HIV screen  08/27/1995   ??? Cervical cancer screen  08/26/2001       HPI:   Chief Complaint   Patient presents with   ??? Hypertension   ??? Shortness of Breath   ??? Chest Pain   She was in the Er with CP and SOB. Her BP was 267/104 with a pulse of 183 per her report. Her potassium was low. She has kidney stones. She sees Dr. Bennie Pierini next week. She needs refills.    Patient's medications, allergies, past medical, surgical, social and family histories were reviewed and updated as appropriate.      Review of Systems   Constitutional: Negative for chills and fever.   HENT: Negative for ear pain and sore throat.    Eyes: Negative for pain and visual disturbance.   Respiratory: Positive for shortness of breath. Negative for cough.    Cardiovascular: Positive for chest pain. Negative for palpitations and leg swelling.   Gastrointestinal: Negative for abdominal pain, nausea and vomiting.   Genitourinary: Positive for flank pain. Negative for dysuria and hematuria.   Musculoskeletal: Negative for joint swelling.   Skin: Negative for rash.   Neurological: Negative for dizziness and weakness.   Psychiatric/Behavioral: Negative for sleep disturbance.       OBJECTIVE:  There were no vitals taken for this visit.   Physical Exam  Constitutional:       Appearance: Normal appearance.   Pulmonary:      Effort: Pulmonary  effort is normal.   Neurological:      Mental Status: She is alert and oriented to person, place, and time.         Results in Past 30 Days  Result Component Current Result Ref Range Previous Result Ref Range   BUN 18 (03/15/2019) 6 - 20 mg/dL Not in Time Range    Calcium 9.2 (03/15/2019) 8.5 - 10.5 mg/dL Not in Time Range    Chloride 103 (03/15/2019) 98 - 107 mmol/L Not in Time Range    CO2 22 (03/15/2019) 20 - 30 mmol/L Not in Time Range    CREATININE 1.1 (03/15/2019) 0.4 - 1.2 mg/dL Not in Time Range    GFR African American >59 (03/15/2019) >59 Not in Time Range    GFR Non-African American 55 (L) (03/15/2019) >59 Not in Time Range    Glucose 117 (H) (03/15/2019) 74 - 106 mg/dL Not in Time Range    Potassium reflex Magnesium 4.0 (03/15/2019) 3.4 - 5.1 mmol/L Not in Time Range    Sodium 139 (03/15/2019) 136 - 145 mmol/L Not in Time Range      Microscopic Examination (no units)   Date Value   11/21/2018 YES       Lab Results   Component Value Date  WBC 7.1 03/15/2019    NEUTROABS 3.7 03/15/2019    HGB 11.7 03/15/2019    HCT 36.6 (L) 03/15/2019    MCV 81.7 03/15/2019    PLT 231 03/15/2019     No results found for: TSH    Prior to Visit Medications    Medication Sig Taking? Authorizing Provider   metoprolol succinate (TOPROL XL) 100 MG extended release tablet Take 1 tablet by mouth daily Yes Broadus John, APRN   Blood Glucose Monitoring Suppl (ONE TOUCH ULTRA 2) w/Device KIT 1 kit by Does not apply route daily Yes Broadus John, APRN   blood glucose test strips (ASCENSIA AUTODISC VI;ONE TOUCH ULTRA TEST VI) strip 1 each by In Vitro route daily QD and PRN Dx:DM2 Yes Broadus John, APRN   blood glucose test strips (ASCENSIA AUTODISC VI;ONE TOUCH ULTRA TEST VI) strip 1 each by In Vitro route daily QD and PRN Dx: DM2 Yes Broadus John, APRN   ferrous gluconate (FERGON) 324 (38 Fe) MG tablet Take 1 tablet by mouth daily (with breakfast) Yes Broadus John, APRN   fluticasone (FLONASE) 50 MCG/ACT nasal spray 2 sprays by Each  Nostril route daily Yes Broadus John, APRN   traZODone (DESYREL) 100 MG tablet Take 1 tablet by mouth nightly Yes Broadus John, APRN   cyclobenzaprine (FLEXERIL) 10 MG tablet Take 1 tablet by mouth 3 times daily as needed for Muscle spasms Yes Broadus John, APRN   SUMAtriptan (IMITREX) 100 MG tablet Take 1 tablet by mouth once as needed for Migraine Yes Broadus John, APRN   ibuprofen (ADVIL;MOTRIN) 800 MG tablet Take 1 tablet by mouth every 8 hours Yes Broadus John, APRN   levothyroxine (SYNTHROID) 125 MCG tablet Take 1 tablet by mouth Daily Yes Broadus John, APRN   lisinopril (PRINIVIL;ZESTRIL) 40 MG tablet Take 1 tablet by mouth daily Yes Broadus John, APRN   magnesium oxide (MAG-OX) 400 MG tablet Take 1 tablet by mouth 2 times daily Yes Broadus John, APRN   promethazine (PHENERGAN) 25 MG tablet Take 1 tablet by mouth every 8 hours as needed for Nausea Yes Broadus John, APRN   ciprofloxacin (CIPRO) 500 MG tablet Take 500 mg by mouth 2 times daily  Historical Provider, MD   amLODIPine (NORVASC) 5 MG tablet Take 1 tablet by mouth daily For blood pressure  Broadus John, APRN   omeprazole (PRILOSEC) 40 MG delayed release capsule Take 1 capsule by mouth Daily  Broadus John, APRN   oxyCODONE-acetaminophen (PERCOCET) 10-325 MG per tablet Take 1 tablet by mouth every 4 hours as needed for Pain.  Historical Provider, MD   HYDROcodone-acetaminophen (NORCO) 5-325 MG per tablet Take 1 tablet by mouth every 6 hours as needed for Pain.  Historical Provider, MD   escitalopram (LEXAPRO) 10 MG tablet TAKE 1 TABLET BY MOUTH DAILY  Broadus John, APRN   glucose monitoring kit (FREESTYLE) monitoring kit 1 kit by Does not apply route daily May sub covered brand Dx:DM2  Broadus John, APRN   FreeStyle Lancets MISC 1 each by Does not apply route daily Dx:DM2  Broadus John, APRN   blood glucose test strips (FREESTYLE TEST STRIPS) strip 1 each by In Vitro route daily Dx:DM2  Broadus John, APRN   ondansetron  (ZOFRAN) 4 MG tablet Take 1 tablet by mouth 3 times daily as needed for Nausea or Vomiting  Broadus John, APRN   diltiazem (  CARDIZEM CD) 360 MG extended release capsule Take 1 capsule by mouth daily  Broadus John, APRN   cloNIDine (CATAPRES) 0.1 MG tablet Take 1 tablet by mouth 2 times daily  Broadus John, APRN   potassium chloride (KLOR-CON M) 20 MEQ extended release tablet Take 2 tablets by mouth 3 times daily  Broadus John, APRN   EPINEPHrine (EPIPEN) 0.3 MG/0.3ML SOAJ injection Inject 0.3 mLs into the muscle as needed (allergic reaction) Use as directed for allergic reaction  Patient not taking: Reported on 03/16/2019  Broadus John, APRN   nitroGLYCERIN (NITROSTAT) 0.4 MG SL tablet Place 1 tablet under the tongue every 5 minutes as needed for Chest pain up to max of 3 total doses. If no relief after 1 dose, call 911.  Broadus John, APRN   albuterol sulfate HFA 108 (90 Base) MCG/ACT inhaler Inhale 2 puffs into the lungs every 4 hours as needed for Wheezing  Patient not taking: Reported on 03/16/2019  Broadus John, APRN       ASSESSMENT:  1. Congestive heart failure, unspecified HF chronicity, unspecified heart failure type ([place])    2. Atrial fibrillation, unspecified type ([place])    3. Essential hypertension        PLAN:  Orders Placed This Encounter   Medications   ??? metoprolol succinate (TOPROL XL) 100 MG extended release tablet     Sig: Take 1 tablet by mouth daily     Dispense:  30 tablet     Refill:  5   ??? Blood Glucose Monitoring Suppl (ONE TOUCH ULTRA 2) w/Device KIT     Sig: 1 kit by Does not apply route daily     Dispense:  1 kit     Refill:  0   ??? blood glucose test strips (ASCENSIA AUTODISC VI;ONE TOUCH ULTRA TEST VI) strip     Sig: 1 each by In Vitro route daily QD and PRN Dx:DM2     Dispense:  50 each     Refill:  5   ??? blood glucose test strips (ASCENSIA AUTODISC VI;ONE TOUCH ULTRA TEST VI) strip     Sig: 1 each by In Vitro route daily QD and PRN Dx: DM2     Dispense:  50 each     Refill:   5   ??? ferrous gluconate (FERGON) 324 (38 Fe) MG tablet     Sig: Take 1 tablet by mouth daily (with breakfast)     Dispense:  30 tablet     Refill:  5   ??? fluticasone (FLONASE) 50 MCG/ACT nasal spray     Sig: 2 sprays by Each Nostril route daily     Dispense:  1 Bottle     Refill:  5   ??? traZODone (DESYREL) 100 MG tablet     Sig: Take 1 tablet by mouth nightly     Dispense:  30 tablet     Refill:  5   ??? cyclobenzaprine (FLEXERIL) 10 MG tablet     Sig: Take 1 tablet by mouth 3 times daily as needed for Muscle spasms     Dispense:  90 tablet     Refill:  5   ??? SUMAtriptan (IMITREX) 100 MG tablet     Sig: Take 1 tablet by mouth once as needed for Migraine     Dispense:  9 tablet     Refill:  5   ??? ibuprofen (ADVIL;MOTRIN) 800 MG tablet     Sig:  Take 1 tablet by mouth every 8 hours     Dispense:  90 tablet     Refill:  5   ??? levothyroxine (SYNTHROID) 125 MCG tablet     Sig: Take 1 tablet by mouth Daily     Dispense:  30 tablet     Refill:  5   ??? lisinopril (PRINIVIL;ZESTRIL) 40 MG tablet     Sig: Take 1 tablet by mouth daily     Dispense:  30 tablet     Refill:  5   ??? magnesium oxide (MAG-OX) 400 MG tablet     Sig: Take 1 tablet by mouth 2 times daily     Dispense:  60 tablet     Refill:  5   ??? promethazine (PHENERGAN) 25 MG tablet     Sig: Take 1 tablet by mouth every 8 hours as needed for Nausea     Dispense:  60 tablet     Refill:  5     Orders Placed This Encounter   Procedures   ??? Amb External Referral To Cardiology     Increase Toprol dosage to 164m QD.    Return in about 2 weeks (around 03/14/2019).       Kristin AMonika Chestangis a 38y.o. female being evaluated by a Virtual Visit (video visit) encounter to address concerns as mentioned above.  A caregiver was present when appropriate. Due to this being a [employer], physiological(During CUUVOZ-36public health emergency), evaluation of the following organ systems was limited: Vitals/Constitutional/EENT/Resp/CV/GI/GU/MS/Neuro/Skin/Heme-Lymph-Imm.  Pursuant to the  emergency declaration under the [place] authority and the [employer] [place] [employer], this Virtual Visit was conducted with patient's (and/or legal guardian's) consent, to reduce the patient's risk of exposure to COVID-19 and provide necessary medical care.  The patient (and/or legal guardian) has also been advised to contact this office for worsening conditions or problems, and seek emergency medical treatment and/or call 911 if deemed necessary.     Services were provided through a video synchronous discussion virtually to substitute for in-person clinic visit. Patient and provider were located at their individual homes.    --PBroadus John APRN on 03/25/2019 at 2:43 PM    An electronic signature was used to authenticate this note.

## 2019-03-27 MED ORDER — ESCITALOPRAM OXALATE 10 MG PO TABS
10 MG | ORAL_TABLET | Freq: Every day | ORAL | 5 refills | Status: DC
Start: 2019-03-27 — End: 2019-10-03

## 2019-03-27 NOTE — Progress Notes (Signed)
SUBJECTIVE:    Patient ID: Kristin Munoz is a 38 y.o. female.    Medical History Review  Past Medical, Family, and Social History reviewed and does contribute to the patient presenting condition    Health Maintenance Due   Topic Date Due   ??? Varicella vaccine (1 of 2 - 2-dose childhood series) 08/26/1981   ??? Pneumococcal 0-64 years Vaccine (1 of 1 - PPSV23) 08/26/1986   ??? HIV screen  08/27/1995   ??? Cervical cancer screen  08/26/2001       HPI:   Chief Complaint   Patient presents with   ??? Nephrolithiasis     2 week follow up. She had kidney stone surgery since her last visit. she also had stents placed.    She has had a lot of pain and anxiety. Her BP is still high.    Patient's medications, allergies, past medical, surgical, social and family histories were reviewed and updated as appropriate.      Review of Systems Reviewed and acurate. See MA note.    OBJECTIVE:  BP (!) 180/110    Pulse 100    Temp 98.9 ??F (37.2 ??C)    Ht _0  (1.575 m)    Wt 193 lb 6.4 oz (87.7 kg)    SpO2 98%    BMI 35.37 kg/m??    Physical Exam  Vitals signs reviewed.   Constitutional:       General: She is not in acute distress.     Appearance: She is well-developed.   HENT:      Head: Normocephalic.      Right Ear: Tympanic membrane normal.      Left Ear: Tympanic membrane normal.      Mouth/Throat:      Pharynx: No oropharyngeal exudate.   Eyes:      General: Lids are normal.   Neck:      Musculoskeletal: Neck supple.   Cardiovascular:      Rate and Rhythm: Normal rate and regular rhythm.      Heart sounds: Normal heart sounds.   Pulmonary:      Effort: Pulmonary effort is normal.      Breath sounds: Normal breath sounds.   Abdominal:      General: Bowel sounds are normal. There is no distension.      Palpations: Abdomen is soft.      Tenderness: There is no abdominal tenderness.   Lymphadenopathy:      Cervical: No cervical adenopathy.   Skin:     General: Skin is warm and dry.   Neurological:      Mental Status: She is alert and  oriented to person, place, and time.         Results in Past 30 Days  Result Component Current Result Ref Range Previous Result Ref Range   BUN 18 (03/15/2019) 6 - 20 mg/dL Not in Time Range    Calcium 9.2 (03/15/2019) 8.5 - 10.5 mg/dL Not in Time Range    Chloride 103 (03/15/2019) 98 - 107 mmol/L Not in Time Range    CO2 22 (03/15/2019) 20 - 30 mmol/L Not in Time Range    CREATININE 1.1 (03/15/2019) 0.4 - 1.2 mg/dL Not in Time Range    GFR African American >59 (03/15/2019) >59 Not in Time Range    GFR Non-African American 55 (L) (03/15/2019) >59 Not in Time Range    Glucose 117 (H) (03/15/2019) 74 - 106 mg/dL Not in Time Range  Potassium reflex Magnesium 4.0 (03/15/2019) 3.4 - 5.1 mmol/L Not in Time Range    Sodium 139 (03/15/2019) 136 - 145 mmol/L Not in Time Range      Microscopic Examination (no units)   Date Value   11/21/2018 YES       Lab Results   Component Value Date    WBC 7.1 03/15/2019    NEUTROABS 3.7 03/15/2019    HGB 11.7 03/15/2019    HCT 36.6 (L) 03/15/2019    MCV 81.7 03/15/2019    PLT 231 03/15/2019     No results found for: TSH    Prior to Visit Medications    Medication Sig Taking? Authorizing Provider   ciprofloxacin (CIPRO) 500 MG tablet Take 500 mg by mouth 2 times daily Yes Historical Provider, MD   amLODIPine (NORVASC) 5 MG tablet Take 1 tablet by mouth daily For blood pressure Yes Broadus John, APRN   omeprazole (PRILOSEC) 40 MG delayed release capsule Take 1 capsule by mouth Daily Yes Broadus John, APRN   metoprolol succinate (TOPROL XL) 100 MG extended release tablet Take 1 tablet by mouth daily Yes Broadus John, APRN   Blood Glucose Monitoring Suppl (ONE TOUCH ULTRA 2) w/Device KIT 1 kit by Does not apply route daily Yes Broadus John, APRN   blood glucose test strips (ASCENSIA AUTODISC VI;ONE TOUCH ULTRA TEST VI) strip 1 each by In Vitro route daily QD and PRN Dx:DM2 Yes Broadus John, APRN   blood glucose test strips (ASCENSIA AUTODISC VI;ONE TOUCH ULTRA TEST VI) strip 1 each by In  Vitro route daily QD and PRN Dx: DM2 Yes Broadus John, APRN   ferrous gluconate (FERGON) 324 (38 Fe) MG tablet Take 1 tablet by mouth daily (with breakfast) Yes Broadus John, APRN   fluticasone (FLONASE) 50 MCG/ACT nasal spray 2 sprays by Each Nostril route daily Yes Broadus John, APRN   traZODone (DESYREL) 100 MG tablet Take 1 tablet by mouth nightly Yes Broadus John, APRN   cyclobenzaprine (FLEXERIL) 10 MG tablet Take 1 tablet by mouth 3 times daily as needed for Muscle spasms Yes Broadus John, APRN   SUMAtriptan (IMITREX) 100 MG tablet Take 1 tablet by mouth once as needed for Migraine Yes Broadus John, APRN   ibuprofen (ADVIL;MOTRIN) 800 MG tablet Take 1 tablet by mouth every 8 hours Yes Broadus John, APRN   levothyroxine (SYNTHROID) 125 MCG tablet Take 1 tablet by mouth Daily Yes Broadus John, APRN   lisinopril (PRINIVIL;ZESTRIL) 40 MG tablet Take 1 tablet by mouth daily Yes Broadus John, APRN   magnesium oxide (MAG-OX) 400 MG tablet Take 1 tablet by mouth 2 times daily Yes Broadus John, APRN   promethazine (PHENERGAN) 25 MG tablet Take 1 tablet by mouth every 8 hours as needed for Nausea Yes Broadus John, APRN   glucose monitoring kit (FREESTYLE) monitoring kit 1 kit by Does not apply route daily May sub covered brand Dx:DM2 Yes Broadus John, APRN   FreeStyle Lancets MISC 1 each by Does not apply route daily Dx:DM2 Yes Broadus John, APRN   blood glucose test strips (FREESTYLE TEST STRIPS) strip 1 each by In Vitro route daily Dx:DM2 Yes Broadus John, APRN   ondansetron (ZOFRAN) 4 MG tablet Take 1 tablet by mouth 3 times daily as needed for Nausea or Vomiting Yes Broadus John, APRN   diltiazem (CARDIZEM CD) 360 MG extended release capsule  Take 1 capsule by mouth daily Yes Broadus John, APRN   cloNIDine (CATAPRES) 0.1 MG tablet Take 1 tablet by mouth 2 times daily Yes Broadus John, APRN   potassium chloride (KLOR-CON M) 20 MEQ extended release tablet Take 2 tablets by mouth 3  times daily Yes Broadus John, APRN   nitroGLYCERIN (NITROSTAT) 0.4 MG SL tablet Place 1 tablet under the tongue every 5 minutes as needed for Chest pain up to max of 3 total doses. If no relief after 1 dose, call 911. Yes Broadus John, APRN   escitalopram (LEXAPRO) 10 MG tablet TAKE 1 TABLET BY MOUTH DAILY  Broadus John, APRN   oxyCODONE-acetaminophen (PERCOCET) 10-325 MG per tablet Take 1 tablet by mouth every 4 hours as needed for Pain.  Historical Provider, MD   HYDROcodone-acetaminophen (NORCO) 5-325 MG per tablet Take 1 tablet by mouth every 6 hours as needed for Pain.  Historical Provider, MD   EPINEPHrine (EPIPEN) 0.3 MG/0.3ML SOAJ injection Inject 0.3 mLs into the muscle as needed (allergic reaction) Use as directed for allergic reaction  Patient not taking: Reported on 03/16/2019  Broadus John, APRN   albuterol sulfate HFA 108 (90 Base) MCG/ACT inhaler Inhale 2 puffs into the lungs every 4 hours as needed for Wheezing  Patient not taking: Reported on 03/16/2019  Broadus John, APRN       ASSESSMENT:  1. Essential hypertension    2. Anxiety    3. Gastroesophageal reflux disease, esophagitis presence not specified        PLAN:  Orders Placed This Encounter   Medications   ??? amLODIPine (NORVASC) 5 MG tablet     Sig: Take 1 tablet by mouth daily For blood pressure     Dispense:  30 tablet     Refill:  5   ??? diazePAM (VALIUM) 5 MG tablet     Sig: Take 1 tablet by mouth every 12 hours as needed for Anxiety or Sleep for up to 3 days.     Dispense:  5 tablet     Refill:  0   ??? omeprazole (PRILOSEC) 40 MG delayed release capsule     Sig: Take 1 capsule by mouth Daily     Dispense:  30 capsule     Refill:  5     No orders of the defined types were placed in this encounter.    Patient Instructions   ?? Keep a list of your medicines with you. List all of the prescription medicines, nonprescription medicines, supplements, natural remedies, and vitamins that you take. Tell your healthcare providers who treat you  about all of the products you are taking. Your provider can provide you with a form to keep track of them. Just ask.  ?? Follow the directions that come with your medicine, including information about food or alcohol. Make sure you know how and when to take your medicine. Do not take more or less than you are supposed to take.  ?? Keep all medicines out of the reach of children.  ?? Store medicines according to the directions on the label.  ?? Monitor yourself. Learn to know how your body reacts to your new medicine and keep track of how it makes you feel before attempting (If your provider has allowed you to do so) to drive or go to work.   ?? Seek emergency medical attention if you think you have used too much of this medicine. An overdose of any  prescription medicine can be fatal. Overdose symptoms may include extreme drowsiness, muscle weakness, confusion, cold and clammy skin, pinpoint pupils, shallow breathing, slow heart rate, fainting, or coma.  ?? Don't share prescription medicines with others, even when they seem to have the same symptoms. What may be good for you may be harmful to others.  ?? If you are no longer taking a prescribed medication and you have pills left please take your pills out of their original containers. Mix crushed pills with an undesirable substance, such as cat litter or used coffee grounds. Put the mixture into a disposable container with a lid, such as an empty margarine tub, or into a sealable bag.  Cover up or remove any of your personal information on the empty containers by covering it with black permanent marker or duct tape.  Place the sealed container with the mixture, and the empty drug containers, in the trash.   ?? If you use a medication that is in the form of a patch, dispose of used patches by folding them in half so that the sticky sides meet, and then flushing them down a toilet. They should not be placed in the household trash where children or pets can find them.  ?? If you  have any questions, ask your provider or pharmacist for more information.   ?? Be sure to keep all appointments for provider visits or tests.  We are committed to providing you with the best care possible.   In order to help [place] achieve these goals please remember to bring all medications, herbal products, and over the counter supplements with you to each visit.     If your provider has ordered testing for you, please be sure to follow up with our office if you have not received results within 7 days after the testing took place.     *If you receive a survey after visiting one of our offices, please take time to share your experience concerning your physician office visit. These surveys are confidential and no health information about you is shared.  We are eager to improve for you and we are counting on your feedback to help make that happen.    ips to Help You Stop Smoking       Cigarette smoking is a preventable cause of death in the [place]. If you have thought about quitting but haven't been able to, here are some reasons why you should and some ways to do it.   Here's Why   Quitting smoking now can decrease your risk of getting smoking-related illnesses like:   Heart disease   Stroke   Several types of cancer, including:   Lung   Mouth   Esophagus   Larynx   Bladder   Pancreas   Kidney   Chronic lung diseases:   Bronchitis   Emphysema   Asthma   Cataracts   Macular degeneration   Thyroid conditions   Hearing loss   Erectile dysfunction   Dementia   Osteoporosis   Here's How   Once you've decided to quit smoking, set your target quit date a few weeks away. In the time leading up to your quit day, try some of these ideas offered by the [place] to help you successfully quit smoking.   For the best results, work with your doctor. Together, you can test your lung function and compare the results to those of a nonsmoking person. The results  can be given to  you as your lung age. Finding out your lung age right after having the test done may help you to stop smoking.   Your doctor can also discuss with you all of your options and refer you to smoking-cessation support groups. You may wish to use nicotine replacement (gum, patches, inhaler) or one of the prescription medications that have been shown to increase quit rates and prolong abstinence from smoking. But whatever you and your doctor decide on these matters, it will still be you who decides when an how to quit. Here are some techniques:   Switch Brands   Switch to a brand you find distasteful.   Change to a brand that is low in tar and nicotine a couple of weeks before your target quit date. This will help change your smoking behavior. However, do not smoke more cigarettes, inhale them more often or more deeply, or place your fingertips over the holes in the filters. All of these actions will increase your nicotine intake, and the idea is to get your body used to functioning without nicotine.   Cut Down the Number of Cigarettes You Smoke   Smoke only half of each cigarette.   Each day, postpone the lighting of your first cigarette by one hour.   Decide you'll only smoke during odd or even hours of the day.   Decide beforehand how many cigarettes you'll smoke during the day. For each additional cigarette, give a dollar to your favorite charity.   Change your eating habits to help you cut down. For example, drink milk, which many people consider incompatible with smoking. End meals or snacks with something that won't lead to a cigarette.   Reach for a glass of juice instead of a cigarette for a "pick-me-up."   Remember: Cutting down can help you quit, but it's not a substitute for quitting. If you're down to about seven cigarettes a day, it's time to set your target quit date, and get ready to stick to it.   Don't Smoke "Automatically"   Smoke only those cigarettes you really want. Catch yourself before you light up  a cigarette out of pure habit.   Don't empty your ashtrays. This will remind you of how many cigarettes you've smoked each day, and the sight and the smell of stale cigarettes butts will be very unpleasant.   Make yourself aware of each cigarette by using the opposite hand or putting cigarettes in an unfamiliar location or a different pocket to break the automatic reach.   If you light up many times during the day without even thinking about it, try to look in a mirror each time you put a match to your cigarette. You may decide you don't need it.   Make Smoking Inconvenient   Stop buying cigarettes by the carton. Wait until one pack is empty before you buy another.   Stop carrying cigarettes with you at home or at work. Make them difficult to get to.   Make Smoking Unpleasant   Smoke only under circumstances that aren't especially pleasurable for you. If you like to smoke with others, smoke alone. Turn your chair to an empty corner and focus only on the cigarette you are smoking and all its many negative effects.   Collect all your cigarette butts in one large glass container as a visual reminder of the filth made by smoking.   Just Before Quitting   Practice going without cigarettes.   Don't think of  never smoking again. Think of quitting in terms of one day at a time .   Tell yourself you won't smoke today, and then don't.   Clean your clothes to rid them of the cigarette smell, which can linger a long time.   On the Day You Quit   Throw away all your cigarettes and matches. Hide Environmental health practitioner.   Visit the dentist and have your teeth cleaned to get rid of tobacco stains. Notice how nice they look and resolve to keep them that way.   Make a list of things you'd like to buy for yourself or someone else. Estimate the cost in terms of packs of cigarettes, and put the money aside to buy these presents.   Keep very busy on the big day. Go to the movies, exercise, take long walks, or go bike riding.   Remind  your family and friends that this is your quit date, and ask them to help you over the rough spots of the first couple of days and weeks.   Buy yourself a treat or do something special to celebrate.   Telephone and Internet Support   Telephone, web-, and computer-based programs can offer you the support that you need to quit and to stay smoke-free. You can find many programs online, like the American [employer] Association's Freedom from Smoking .   Immediately After Quitting   Develop a clean, fresh, nonsmoking environment around yourselfat work and at home. Buy yourself flowersyou may be surprised how much you can enjoy their scent now.   The first few days after you quit, spend as much free time as possible in places where smoking isn't allowed, such as [place], museums, theaters, department stores, and churches.   Drink large quantities of water and fruit juice (but avoid sodas that contain caffeine).   Try to avoid alcohol, coffee, and other beverages that you associate with cigarette smoking.   Strike up conversation instead of a Orthoptist for a cigarette.   If you miss the sensation of having a cigarette in your hand, play with something elsea pencil, a paper clip, a marble.   If you miss having something in your mouth, try toothpicks or a fake cigarette.            Return in about 2 weeks (around 03/30/2019).

## 2019-03-29 ENCOUNTER — Encounter: Payer: MEDICAID | Attending: Family | Primary: Family

## 2019-04-04 ENCOUNTER — Encounter

## 2019-04-05 MED ORDER — ONDANSETRON HCL 4 MG PO TABS
4 MG | ORAL_TABLET | ORAL | 2 refills | Status: DC
Start: 2019-04-05 — End: 2019-10-03

## 2019-04-09 NOTE — Care Coordination-Inpatient (Signed)
Date/Time:  04/09/2019 11:44 AM  Attempted to reach patient by telephone. Unable to reach patient.

## 2019-05-01 MED ORDER — DILTIAZEM HCL ER COATED BEADS 360 MG PO CP24
360 MG | ORAL_CAPSULE | ORAL | 5 refills | Status: DC
Start: 2019-05-01 — End: 2019-11-01

## 2019-05-01 MED ORDER — CLONIDINE HCL 0.1 MG PO TABS
0.1 MG | ORAL_TABLET | ORAL | 5 refills | Status: DC
Start: 2019-05-01 — End: 2019-11-01

## 2019-05-10 ENCOUNTER — Emergency Department: Admit: 2019-05-10 | Payer: MEDICAID | Primary: Family

## 2019-05-10 ENCOUNTER — Inpatient Hospital Stay
Admit: 2019-05-10 | Discharge: 2019-05-10 | Disposition: A | Discharge status: Home / Self Care | Payer: MEDICAID | Attending: Emergency Medicine

## 2019-05-10 DIAGNOSIS — R51 Headache: Secondary | ICD-10-CM

## 2019-05-10 MED ORDER — DIPHENHYDRAMINE HCL 50 MG/ML IJ SOLN
50 MG/ML | Freq: Once | INTRAMUSCULAR | Status: AC
Start: 2019-05-10 — End: 2019-05-10
  Administered 2019-05-10: 14:00:00 25 mg via INTRAVENOUS

## 2019-05-10 MED ORDER — SODIUM CHLORIDE 0.9 % IV BOLUS
0.9 % | Freq: Once | INTRAVENOUS | Status: AC
Start: 2019-05-10 — End: 2019-05-10
  Administered 2019-05-10: 14:00:00 1000 mL via INTRAVENOUS

## 2019-05-10 MED ORDER — MORPHINE SULFATE (PF) 2 MG/ML IV SOLN
2 MG/ML | Freq: Once | INTRAVENOUS | Status: AC
Start: 2019-05-10 — End: 2019-05-10
  Administered 2019-05-10: 14:00:00 2 mg via INTRAVENOUS

## 2019-05-10 MED ORDER — BUTALBITAL-APAP-CAFFEINE 50-325-40 MG PO TABS
50-325-40 MG | ORAL_TABLET | ORAL | 0 refills | Status: DC | PRN
Start: 2019-05-10 — End: 2019-08-18

## 2019-05-10 MED ORDER — METOCLOPRAMIDE HCL 5 MG/ML IJ SOLN
5 MG/ML | Freq: Once | INTRAMUSCULAR | Status: AC
Start: 2019-05-10 — End: 2019-05-10
  Administered 2019-05-10: 14:00:00 10 mg via INTRAVENOUS

## 2019-05-10 MED FILL — DIPHENHYDRAMINE HCL 50 MG/ML IJ SOLN: 50 MG/ML | INTRAMUSCULAR | Qty: 1

## 2019-05-10 MED FILL — MORPHINE SULFATE 2 MG/ML IJ SOLN: 2 mg/mL | INTRAMUSCULAR | Qty: 1

## 2019-05-10 MED FILL — METOCLOPRAMIDE HCL 5 MG/ML IJ SOLN: 5 MG/ML | INTRAMUSCULAR | Qty: 2
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
## 2019-05-10 NOTE — ED Notes (Signed)
Dr. Dimple Casey notified of elevated BP. He states he is okay for her to go home if she is okay. Patient may have clonidine in ED if she requests. This note is completed by Greer Ee, nursing student.     Eston Mould, RN  05/10/19 1131
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
## 2019-05-10 NOTE — ED Notes (Signed)
C-Collar placed on pts, per order. Pt tolerated well.      Kristin Marasco, RN  05/10/19 [phone]

## 2019-05-10 NOTE — ED Notes (Signed)
Patient's c collar removed per Dr Dimple Casey.     Eston Mould, RN  05/10/19 1025

## 2019-05-10 NOTE — ED Notes (Signed)
AVS and RX reviewed with patient. understanding and verbalized. Patient states she will take her clonidine when she gets home related to elevated BP. PCP follow up recommended. Patient voices no further needs or concerns. This note is completed by Junius Argyle, nursing student.       Eston Mould, RN  05/10/19 1130

## 2019-05-25 ENCOUNTER — Emergency Department: Admit: 2019-05-26 | Payer: MEDICAID | Primary: Family

## 2019-05-25 DIAGNOSIS — S161XXA Strain of muscle, fascia and tendon at neck level, initial encounter: Secondary | ICD-10-CM
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
## 2019-05-25 NOTE — ED Notes (Signed)
pts daughter had covid 5 months ago     Kristin Munoz, [place]  05/25/19 2033
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
## 2019-05-26 ENCOUNTER — Inpatient Hospital Stay
Admit: 2019-05-26 | Discharge: 2019-05-26 | Disposition: A | Discharge status: Home / Self Care | Payer: MEDICAID | Attending: Emergency Medicine

## 2019-05-26 ENCOUNTER — Inpatient Hospital Stay
Admit: 2019-05-26 | Discharge: 2019-05-27 | Disposition: A | Discharge status: Home / Self Care | Payer: MEDICAID | Attending: Emergency Medicine

## 2019-05-26 ENCOUNTER — Emergency Department: Admit: 2019-05-26 | Payer: MEDICAID | Primary: Family

## 2019-05-26 DIAGNOSIS — R51 Headache: Secondary | ICD-10-CM

## 2019-05-26 LAB — CBC WITH AUTO DIFFERENTIAL
Basophils %: 0.4 %
Basophils %: 0.4 %
Basophils Absolute: 0 10*3/uL (ref 0.0–0.1)
Basophils Absolute: 0 10*3/uL (ref 0.0–0.1)
Eosinophils %: 1.9 %
Eosinophils %: 1.8 %
Eosinophils Absolute: 0.1 10*3/uL (ref 0.0–0.4)
Eosinophils Absolute: 0.1 10*3/uL (ref 0.0–0.4)
Hematocrit: 38.2 % (ref 37.0–47.0)
Hematocrit: 36.9 % — ABNORMAL LOW (ref 37.0–47.0)
Hemoglobin: 11.8 g/dL (ref 11.5–16.5)
Hemoglobin: 11.1 g/dL — ABNORMAL LOW (ref 11.5–16.5)
Immature Granulocytes #: 0 10*3/uL
Immature Granulocytes #: 0 10*3/uL
Immature Granulocytes %: 0.1 % (ref 0.0–5.0)
Immature Granulocytes %: 0.4 % (ref 0.0–5.0)
Lymphocytes %: 44 %
Lymphocytes %: 35.8 %
Lymphocytes Absolute: 3 10*3/uL (ref 1.5–4.0)
Lymphocytes Absolute: 2.4 10*3/uL (ref 1.5–4.0)
MCH: 25.8 pg — ABNORMAL LOW (ref 27.0–32.0)
MCH: 25.4 pg — ABNORMAL LOW (ref 27.0–32.0)
MCHC: 30.9 g/dL — ABNORMAL LOW (ref 31.0–35.0)
MCHC: 30.1 g/dL — ABNORMAL LOW (ref 31.0–35.0)
MCV: 83.4 fL (ref 80.0–100.0)
MCV: 84.4 fL (ref 80.0–100.0)
MPV: 10 fL (ref 6.0–10.0)
MPV: 10.1 fL — ABNORMAL HIGH (ref 6.0–10.0)
Monocytes %: 5.8 %
Monocytes %: 6.6 %
Monocytes Absolute: 0.4 10*3/uL (ref 0.2–0.8)
Monocytes Absolute: 0.4 10*3/uL (ref 0.2–0.8)
Neutrophils %: 47.8 %
Neutrophils %: 55 %
Neutrophils Absolute: 3.2 10*3/uL (ref 2.0–7.5)
Neutrophils Absolute: 3.7 10*3/uL (ref 2.0–7.5)
Platelets: 272 10*3/uL (ref 150–400)
Platelets: 244 10*3/uL (ref 150–400)
RBC: 4.58 M/uL (ref 3.80–5.80)
RBC: 4.37 M/uL (ref 3.80–5.80)
RDW: 15.9 % (ref 11.0–16.0)
RDW: 15.9 % (ref 11.0–16.0)
WBC: 6.7 10*3/uL (ref 4.0–11.0)
WBC: 6.7 10*3/uL (ref 4.0–11.0)

## 2019-05-26 LAB — COMPREHENSIVE METABOLIC PANEL W/ REFLEX TO MG FOR LOW K
ALT: 14 U/L (ref 4–36)
ALT: 15 U/L (ref 4–36)
AST: 17 U/L (ref 8–33)
AST: 15 U/L (ref 8–33)
Albumin/Globulin Ratio: 1.7 (ref 0.8–2.0)
Albumin/Globulin Ratio: 1.7 (ref 0.8–2.0)
Albumin: 4.5 g/dL (ref 3.4–4.8)
Albumin: 4.3 g/dL (ref 3.4–4.8)
Alkaline Phosphatase: 102 U/L — ABNORMAL HIGH (ref 25–100)
Alkaline Phosphatase: 100 U/L (ref 25–100)
Anion Gap: 15 (ref 3–16)
Anion Gap: 12 (ref 3–16)
BUN: 13 mg/dL (ref 6–20)
BUN: 12 mg/dL (ref 6–20)
CO2: 23 mmol/L (ref 20–30)
CO2: 23 mmol/L (ref 20–30)
Calcium: 9.7 mg/dL (ref 8.5–10.5)
Calcium: 10.3 mg/dL (ref 8.5–10.5)
Chloride: 102 mmol/L (ref 98–107)
Chloride: 106 mmol/L (ref 98–107)
Creatinine: 1.2 mg/dL (ref 0.4–1.2)
Creatinine: 0.9 mg/dL (ref 0.4–1.2)
GFR African American: >59 (ref >59)
GFR African American: >59 (ref >59)
GFR Non-African American: 50 — ABNORMAL LOW (ref >59)
GFR Non-African American: >60 (ref >59)
Globulin: 2.6 g/dL
Globulin: 2.6 g/dL
Glucose: 101 mg/dL (ref 74–106)
Glucose: 77 mg/dL (ref 74–106)
Potassium reflex Magnesium: 3 mmol/L — ABNORMAL LOW (ref 3.4–5.1)
Potassium reflex Magnesium: 3.3 mmol/L — ABNORMAL LOW (ref 3.4–5.1)
Sodium: 140 mmol/L (ref 136–145)
Sodium: 141 mmol/L (ref 136–145)
Total Bilirubin: <0.2 mg/dL — ABNORMAL LOW (ref 0.3–1.2)
Total Bilirubin: <0.2 mg/dL — ABNORMAL LOW (ref 0.3–1.2)
Total Protein: 7.1 g/dL (ref 6.4–8.3)
Total Protein: 6.9 g/dL (ref 6.4–8.3)

## 2019-05-26 LAB — D-DIMER, QUANTITATIVE: D-Dimer, Quant: 0.36 ug/mL FEU (ref 0.00–0.60)

## 2019-05-26 LAB — URINALYSIS WITH REFLEX TO CULTURE
Bilirubin Urine: NEGATIVE
Blood, Urine: NEGATIVE
Glucose, Ur: NEGATIVE mg/dL
Ketones, Urine: NEGATIVE mg/dL
Leukocyte Esterase, Urine: NEGATIVE
Nitrite, Urine: NEGATIVE
Protein, UA: NEGATIVE mg/dL
Specific Gravity, UA: <=1.005 (ref 1.005–1.030)
Urobilinogen, Urine: 0.2 E.U./dL (ref <2.0)
pH, UA: 6.5 (ref 5.0–8.0)

## 2019-05-26 LAB — MAGNESIUM
Magnesium: 1.6 mg/dL — ABNORMAL LOW (ref 1.7–2.4)
Magnesium: 1.9 mg/dL (ref 1.7–2.4)

## 2019-05-26 LAB — TROPONIN
Troponin: <0.3 ng/mL (ref <0.30)
Troponin: <0.3 ng/mL (ref <0.30)

## 2019-05-26 MED ORDER — HYDROMORPHONE HCL 1 MG/ML IJ SOLN
1 MG/ML | Freq: Once | INTRAMUSCULAR | Status: AC
Start: 2019-05-26 — End: 2019-05-25
  Administered 2019-05-26: 01:00:00 1 mg via INTRAMUSCULAR

## 2019-05-26 MED ORDER — OXYCODONE-ACETAMINOPHEN 5-325 MG PO TABS
5-325 MG | ORAL_TABLET | ORAL | 0 refills | Status: AC | PRN
Start: 2019-05-26 — End: 2019-05-30

## 2019-05-26 MED ORDER — HYDROMORPHONE HCL 1 MG/ML IJ SOLN
1 MG/ML | Freq: Once | INTRAMUSCULAR | Status: AC
Start: 2019-05-26 — End: 2019-05-26
  Administered 2019-05-26: 23:00:00 1 mg via INTRAVENOUS

## 2019-05-26 MED ORDER — PROMETHAZINE (PHENERGAN) 25MG 50 ML IVPB
Freq: Once | INTRAVENOUS | Status: AC
Start: 2019-05-26 — End: 2019-05-26
  Administered 2019-05-26: 23:00:00 25 mg via INTRAVENOUS

## 2019-05-26 MED ORDER — PROMETHAZINE HCL 25 MG PO TABS
25 MG | ORAL_TABLET | ORAL | 0 refills | Status: AC | PRN
Start: 2019-05-26 — End: 2019-06-01

## 2019-05-26 MED ORDER — PROMETHAZINE HCL 25 MG/ML IJ SOLN
25 MG/ML | Freq: Once | INTRAMUSCULAR | Status: AC
Start: 2019-05-26 — End: 2019-05-25
  Administered 2019-05-26: 01:00:00 25 mg via INTRAMUSCULAR

## 2019-05-26 MED ORDER — SODIUM CHLORIDE 0.9 % IV BOLUS
0.9 | Freq: Once | INTRAVENOUS | Status: AC
Start: 2019-05-26 — End: 2019-05-26
  Administered 2019-05-26: 23:00:00 1000 mL via INTRAVENOUS

## 2019-05-26 MED FILL — PROMETHAZINE (PHENERGAN) 25 MG 50 ML IVPB: INTRAVENOUS | Qty: 50

## 2019-05-26 MED FILL — PROMETHAZINE HCL 25 MG/ML IJ SOLN: 25 mg/mL | INTRAMUSCULAR | Qty: 1

## 2019-05-26 MED FILL — DILAUDID 1 MG/ML IJ SOLN: 1 MG/ML | INTRAMUSCULAR | Qty: 1

## 2019-05-26 NOTE — ED Provider Notes (Signed)
8:02 PM EDT  Kristin Munoz was checked out to me by Dr. Dimple Casey. Please see his/her initial documentation for details of the patient's ED presentation, physical exam and completed studies.    In brief, Kristin Munoz is a 38 y.o. female that presents to ED with multitude of complaints. Pt was just in the ER last night and prior to that on 8/13. Pt said she is having continued pain to her neck and says that every time she pushes her head back, she passes out. She has history of chronic headaches. Been having nausea/vomiting/diarrhea for 1 month last night but now says 5 months. Says she has had a fever of 105 F today but said that last night and was normal both nights. She had K+ 3.0 yesterday and today 3.3. She didn't fill the meds given Percocet today. She has been having other benign complaints. Work up today is benign. Will give Kdur for 1 week. Pt to follow up with PCP.     I have reviewed and interpreted all of the currently available lab results from this visit (if applicable):  Results for orders placed or performed during the hospital encounter of 05/26/19   Urine Reflex to Culture    Specimen: Urine voided   Result Value Ref Range    Color, UA Yellow Straw/Yellow    Clarity, UA Clear Clear    Glucose, Ur Negative Negative mg/dL    Bilirubin Urine Negative Negative    Ketones, Urine Negative Negative mg/dL    Specific Gravity, UA <=1.005 1.005 - 1.030    Blood, Urine Negative Negative    pH, UA 6.5 5.0 - 8.0    Protein, UA Negative Negative mg/dL    Urobilinogen, Urine 0.2 <2.0 E.U./dL    Nitrite, Urine Negative Negative    Leukocyte Esterase, Urine Negative Negative    Microscopic Examination Not Indicated     Urine Type Voided     Urine Reflex to Culture Not Indicated    CBC Auto Differential   Result Value Ref Range    WBC 6.7 4.0 - 11.0 K/uL    RBC 4.37 3.80 - 5.80 M/uL    Hemoglobin 11.1 (L) 11.5 - 16.5 g/dL    Hematocrit 91.4 (L) 37.0 - 47.0 %    MCV 84.4 80.0 - 100.0 fL    MCH 25.4 (L) 27.0 -  32.0 pg    MCHC 30.1 (L) 31.0 - 35.0 g/dL    RDW 78.2 95.6 - 21.3 %    Platelets 244 150 - 400 K/uL    MPV 10.1 (H) 6.0 - 10.0 fL    Neutrophils % 55.0 %    Immature Granulocytes % 0.4 0.0 - 5.0 %    Lymphocytes % 35.8 %    Monocytes % 6.6 %    Eosinophils % 1.8 %    Basophils % 0.4 %    Neutrophils Absolute 3.7 2.0 - 7.5 K/uL    Immature Granulocytes # 0.0 K/uL    Lymphocytes Absolute 2.4 1.5 - 4.0 K/uL    Monocytes Absolute 0.4 0.2 - 0.8 K/uL    Eosinophils Absolute 0.1 0.0 - 0.4 K/uL    Basophils Absolute 0.0 0.0 - 0.1 K/uL   Comprehensive Metabolic Panel w/ Reflex to MG   Result Value Ref Range    Sodium 141 136 - 145 mmol/L    Potassium reflex Magnesium 3.3 (L) 3.4 - 5.1 mmol/L    Chloride 106 98 - 107 mmol/L    CO2  23 20 - 30 mmol/L    Anion Gap 12 3 - 16    Glucose 77 74 - 106 mg/dL    BUN 12 6 - 20 mg/dL    CREATININE 0.9 0.4 - 1.2 mg/dL    GFR Non-African American >60 >59    GFR African American >59 >59    Calcium 10.3 8.5 - 10.5 mg/dL    Total Protein 6.9 6.4 - 8.3 g/dL    Alb 4.3 3.4 - 4.8 g/dL    Albumin/Globulin Ratio 1.7 0.8 - 2.0    Total Bilirubin <0.2 (L) 0.3 - 1.2 mg/dL    Alkaline Phosphatase 100 25 - 100 U/L    ALT 15 4 - 36 U/L    AST 15 8 - 33 U/L    Globulin 2.6 g/dL   Troponin   Result Value Ref Range    Troponin <0.30 <0.30 ng/mL   D-Dimer, Quantitative   Result Value Ref Range    D-Dimer, Quant 0.36 0.00 - 0.60 ug/mL FEU   Magnesium   Result Value Ref Range    Magnesium 1.9 1.7 - 2.4 mg/dL       MDM:  Pt with benign labs. 2nd visit and has either drug seeking behavior or has some mental disorder. Still has soft neck collar on today. Pt to see PCP for further complaints and no emergent findings noted by exam or labs, CT's or x-rays. Pt without fever, no shortness of breath, sat 93%, BP 156/90.     Final Impression:  1. Nonintractable headache, unspecified chronicity pattern, unspecified headache type    2. Neck pain    3. Hypokalemia      Rx Kdur 10 meq 1 po daily    (Please note that  portions of this note may have been completed with a voice recognition program. Efforts were made to edit the dictations but occasionally words are mis-transcribed.)    Baird Kay, DO       [employer], DO  05/26/19 2009

## 2019-05-26 NOTE — ED Notes (Signed)
D/c instructions given, rx given. Patient with no further questions at this time.  Greer Ee, nursing student.     Nicanor Alcon, RN  05/26/19 2017

## 2019-05-26 NOTE — Care Coordination-Inpatient (Signed)
Patient contacted regarding recent discharge and COVID-19 risk. Discussed COVID-19 related testing which was not done at this time. Test results were not done.     Care Transition Nurse/ Ambulatory Care Manager contacted the patient by telephone to perform post discharge assessment. Verified name and DOB with patient as identifiers.     Patient has following risk factors of: heart failure, COPD, asthma and diabetes. .     Due to patient's condition, CTN was unable to cover COVID-19 precautions with patient.      CTN spoke with patient via telephone.  Patient was crying inconsolably and stating that she is having "a lot of pain."  Patient states that she has "passed out multiple times today."  Patient also states that she has a fever of 105.  CTN asked if she meant 100.5 and patient verified that her temperature was 105.  Patient states "I don't know what to do, I feel so bad and I can't take it anymore."   CTN advised patient to call 911.  Patient states "I can't catch my breath."  CTN called 911 at [phone] and spoke with Shanda Bumps.  Jessica dispatched ambulance to patient's address while on call with me.  Shanda Bumps states that she will call receiving hospital to notify them of patient's estimated arrival time and condition of patient.    Kathie Dike, RN  Care Transitions Nurse
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
## 2019-05-26 NOTE — ED Notes (Signed)
Received call from Debbie at [facility]. She informed me that patient had not had a procedure at [facility] since June of 2020. The procedure was bilateral stone removal and stints. Eunice Blase stated she would fax the information to [place].     Amari Zagal L Brason Berthelot  05/26/19 1847

## 2019-05-26 NOTE — ED Notes (Signed)
Contacted [facility] and spoke with Eunice Blase. I informed Eunice Blase that we needed medical records for the patient. Eunice Blase took patient information and stated she would call us back.     Arisa Congleton L Rithika Seel  05/26/19 1830

## 2019-05-26 NOTE — ED Notes (Signed)
Patient arrives by [facility] wearing a soft c collar.     Nicanor Alcon, RN  05/26/19 [phone]

## 2019-05-26 NOTE — ED Notes (Addendum)
Patient presents to the ED with complaints of a severe headache, shortness of breathe and a fever for 5 days of 105 F.  Greer Ee, nursing student.     Nicanor Alcon, RN  05/26/19 1806       Nicanor Alcon, RN  05/26/19 [phone]

## 2019-05-27 MED ORDER — POTASSIUM CHLORIDE CRYS ER 10 MEQ PO TBCR
10 MEQ | ORAL_TABLET | Freq: Every day | ORAL | 0 refills | Status: DC
Start: 2019-05-27 — End: 2019-06-20

## 2019-05-28 NOTE — Care Coordination-Inpatient (Signed)
Patient contacted regarding recent discharge and COVID-19 risk. Discussed COVID-19 related testing which was not done at this time. Test results were not done.      Care Transition Nurse/ Ambulatory Care Manager contacted the patient by telephone to perform post discharge assessment. Verified name and DOB with patient as identifiers.     Patient has following risk factors of: heart failure, COPD, asthma and diabetes. CTN/ACM reviewed discharge instructions, medical action plan and red flags related to discharge diagnosis. Reviewed and educated them on any new and changed medications related to discharge diagnosis.  Advised obtaining a 90-day supply of all daily and as-needed medications.     Education provided regarding infection prevention, and signs and symptoms of COVID-19 and when to seek medical attention with patient who verbalized understanding. Discussed exposure protocols and quarantine from [facility] Guidelines "Are you at higher risk for severe illness 2019" and given an opportunity for questions and concerns. The patient agrees to contact the COVID-19 hotline [phone] or PCP office for questions related to their healthcare. CTN/ACM provided contact information for future reference.    From CDC: Are you at higher risk for severe illness?    Kristin Munoz your hands often.  Marland Kitchen Avoid close contact (6 feet, which is about two arm lengths) with people who are sick.  . Put distance between yourself and other people if COVID-19 is spreading in your community.  . Clean and disinfect frequently touched surfaces.  . Avoid all cruise travel and non-essential air travel.  . Call your healthcare professional if you have concerns about COVID-19 and your underlying condition or if you are sick.    For more information on steps you can take to protect yourself, see CDC's How to Protect Yourself    Plan for follow-up call in 7-14 days based on severity of symptoms and risk factors.    Patient states she is feeling better, however  she continues with fever.  Patient states her fever has been as high as 103.  Patient states she is taking Tylenol as directed. Patient denies, headache, cough, or shortness of breath. Patient agrees to call PCP if experiencing new or worsening symptoms or will call 911 for severe or life threatening symptoms.  Patient agrees to follow COVID-19 precautions.

## 2019-06-06 MED ORDER — POTASSIUM CHLORIDE CRYS ER 20 MEQ PO TBCR
20 MEQ | ORAL_TABLET | ORAL | 5 refills | Status: DC
Start: 2019-06-06 — End: 2019-12-03

## 2019-06-06 MED ORDER — NITROGLYCERIN 0.4 MG SL SUBL
0.4 MG | ORAL_TABLET | SUBLINGUAL | 0 refills | Status: DC
Start: 2019-06-06 — End: 2019-07-04

## 2019-06-06 NOTE — Telephone Encounter (Signed)
Kristin Munoz requests that Kristin Munoz return their call. The best time to reach Kristin Munoz is Anytime.   Kristin Munoz called to schedule an ED follow up with [facility]. Kristin Munoz was seen at the [facility] [address] a couple of times for a concussion. Please call Kristin Munoz back. Kristin Munoz is wanting to see if she can be seen before next month.   Thank you.

## 2019-06-07 NOTE — Telephone Encounter (Signed)
Unable to reach patient to schedule appt for patient. Pam stated to schedule next available and would need to find out what er she went to.

## 2019-06-08 MED ORDER — FLUTICASONE PROPIONATE 50 MCG/ACT NA SUSP
50 MCG/ACT | NASAL | 5 refills | Status: DC
Start: 2019-06-08 — End: 2020-01-30

## 2019-06-08 MED ORDER — LISINOPRIL 40 MG PO TABS
40 MG | ORAL_TABLET | ORAL | 5 refills | Status: DC
Start: 2019-06-08 — End: 2020-01-14

## 2019-06-18 NOTE — Care Coordination-Inpatient (Signed)
Date/Time:  06/13/2019 11:27 AM  Attempted to reach patient by telephone. Unable to reach patient.

## 2019-06-20 ENCOUNTER — Ambulatory Visit: Admit: 2019-06-20 | Discharge: 2019-06-20 | Payer: MEDICAID | Attending: Family | Primary: Family

## 2019-06-20 DIAGNOSIS — R519 Headache, unspecified: Secondary | ICD-10-CM

## 2019-06-20 LAB — POCT URINALYSIS DIPSTICK
Bilirubin, UA: NEGATIVE
Glucose, UA POC: NEGATIVE
Ketones, UA: NEGATIVE
Nitrite, UA: NEGATIVE
Protein, UA POC: NEGATIVE
Spec Grav, UA: 1.005
Urobilinogen, UA: 0.2
pH, UA: 6

## 2019-06-20 MED ORDER — DEXAMETHASONE SOD PHOSPHATE PF 10 MG/ML IJ SOLN
10 MG/ML | Freq: Once | INTRAMUSCULAR | Status: DC
Start: 2019-06-20 — End: 2019-06-25

## 2019-06-20 MED ORDER — SUMATRIPTAN SUCCINATE 100 MG PO TABS
100 MG | ORAL_TABLET | Freq: Once | ORAL | 5 refills | Status: DC | PRN
Start: 2019-06-20 — End: 2020-01-02

## 2019-06-20 MED ORDER — FLUCONAZOLE 100 MG PO TABS
100 MG | ORAL_TABLET | Freq: Every day | ORAL | 0 refills | Status: DC
Start: 2019-06-20 — End: 2019-10-03

## 2019-06-20 MED ORDER — METHYLPREDNISOLONE 4 MG PO TBPK
4 MG | PACK | ORAL | 0 refills | Status: DC
Start: 2019-06-20 — End: 2019-08-18

## 2019-06-20 NOTE — Progress Notes (Signed)
Chief Complaint   Patient presents with   ??? Diabetes   ??? Hypertension   ??? Migraine     from concussion   ??? Referral - General     cardiology, neurology, ortho         Have you seen any other physician or provider since your last visit yes    Have you had any other diagnostic tests since your last visit? no    Have you changed or stopped any medications since your last visit? no     I have recommended that this patient have a immunization for pneumo but she declines at this time. I have discussed the risks and benefits of this examination with her. The patient verbalizes understanding.    Review of Systems   Constitutional: Negative for chills, fatigue and fever.   HENT: Negative for congestion, ear pain, rhinorrhea and sore throat.    Eyes: Negative for discharge, redness and itching.   Respiratory: Negative for cough and shortness of breath.    Cardiovascular: Negative for chest pain, palpitations and leg swelling.   Gastrointestinal: Negative for abdominal pain, constipation, diarrhea, nausea and vomiting.   Endocrine: Negative for cold intolerance and heat intolerance.   Genitourinary: Negative for dysuria.   Musculoskeletal: Negative for arthralgias and joint swelling.   Skin: Negative for rash and wound.   Neurological: Positive for headaches. Negative for weakness.   Hematological: Negative for adenopathy.   Psychiatric/Behavioral: Negative for dysphoric mood and sleep disturbance. The patient is not nervous/anxious.

## 2019-06-20 NOTE — Progress Notes (Signed)
SUBJECTIVE:    Patient ID: Kristin Munoz is a 38 y.o. female.    Medical History Review  Past Medical, Family, and Social History reviewed and does contribute to the patient presenting condition    Health Maintenance Due   Topic Date Due   ??? Statin Therapy  12/01/79   ??? Varicella vaccine (1 of 2 - 2-dose childhood series) 08/26/1981   ??? Pneumococcal 0-64 years Vaccine (1 of 1 - PPSV23) 08/26/1986   ??? HIV screen  08/27/1995   ??? Cervical cancer screen  08/26/2001       HPI:   Chief Complaint   Patient presents with   ??? Diabetes   ??? Hypertension   ??? Migraine     from concussion   ??? Referral - General     cardiology, neurology, ortho   She is having a lot of headaches. They start in the back of her head and move towards her temples. Resting in a dark, quiet place helps. She has tried Imitrex and Butalbital. She is passing kidney stones. She has a f/u with urology. She is on a medication to dissolve her stones- Urocit K. She has a rash and irritation in her perineum for the past few days. She has had a little bloody discharge and it feels raw. Her husband cheated on her and she would like to be checked for STDs. She did not see Dr. Donnal Debar.     Patient's medications, allergies, past medical, surgical, social and family histories were reviewed and updated as appropriate.      Review of Systems Reviewed and acurate. See MA note.    OBJECTIVE:  BP 118/70 (Site: Right Upper Arm, Position: Sitting)    Pulse 109    Temp 98.3 ??F (36.8 ??C) (Temporal)    Resp 16    Ht _0  (1.575 m)    Wt 193 lb (87.5 kg)    SpO2 99% Comment: room air   BMI 35.30 kg/m??    Physical Exam  Vitals signs reviewed.   Constitutional:       General: She is not in acute distress.     Appearance: She is well-developed.   HENT:      Head: Normocephalic.      Right Ear: Tympanic membrane normal.      Left Ear: Tympanic membrane normal.      Mouth/Throat:      Pharynx: No oropharyngeal exudate.   Eyes:      General: Lids are normal.   Neck:       Musculoskeletal: Neck supple.   Cardiovascular:      Rate and Rhythm: Normal rate and regular rhythm.      Heart sounds: Normal heart sounds.   Pulmonary:      Effort: Pulmonary effort is normal.      Breath sounds: Normal breath sounds.   Abdominal:      General: Bowel sounds are normal. There is no distension.      Palpations: Abdomen is soft.      Tenderness: There is no abdominal tenderness.   Genitourinary:     Comments: Erythema and tenderness to the entire perineum, no discharge  Lymphadenopathy:      Cervical: No cervical adenopathy.   Skin:     General: Skin is warm and dry.   Neurological:      Mental Status: She is alert and oriented to person, place, and time.         Results in Past 30  Days  Result Component Current Result Ref Range Previous Result Ref Range   Alb 4.3 (05/26/2019) 3.4 - 4.8 g/dL Not in Time Range    Albumin/Globulin Ratio 1.7 (05/26/2019) 0.8 - 2.0 Not in Time Range    Alkaline Phosphatase 100 (05/26/2019) 25 - 100 U/L Not in Time Range    ALT 15 (05/26/2019) 4 - 36 U/L Not in Time Range    AST 15 (05/26/2019) 8 - 33 U/L Not in Time Range    BUN 12 (05/26/2019) 6 - 20 mg/dL Not in Time Range    Calcium 10.3 (05/26/2019) 8.5 - 10.5 mg/dL Not in Time Range    Chloride 106 (05/26/2019) 98 - 107 mmol/L Not in Time Range    CO2 23 (05/26/2019) 20 - 30 mmol/L Not in Time Range    CREATININE 0.9 (05/26/2019) 0.4 - 1.2 mg/dL Not in Time Range    GFR African American >59 (05/26/2019) >59 Not in Time Range    GFR Non-African American >60 (05/26/2019) >59 Not in Time Range    Globulin 2.6 (05/26/2019) g/dL Not in Time Range    Glucose 77 (05/26/2019) 74 - 106 mg/dL Not in Time Range    Potassium reflex Magnesium 3.3 (L) (05/26/2019) 3.4 - 5.1 mmol/L Not in Time Range    Sodium 141 (05/26/2019) 136 - 145 mmol/L Not in Time Range    Total Bilirubin <0.2 (L) (05/26/2019) 0.3 - 1.2 mg/dL Not in Time Range    Total Protein 6.9 (05/26/2019) 6.4 - 8.3 g/dL Not in Time Range      Microscopic Examination (no units)   Date  Value   05/26/2019 Not Indicated       Lab Results   Component Value Date    WBC 6.7 05/26/2019    NEUTROABS 3.7 05/26/2019    HGB 11.1 (L) 05/26/2019    HCT 36.9 (L) 05/26/2019    MCV 84.4 05/26/2019    PLT 244 05/26/2019     No results found for: TSH    Prior to Visit Medications    Medication Sig Taking? Authorizing Provider   SUMAtriptan (IMITREX) 100 MG tablet Take 1 tablet by mouth once as needed for Migraine Yes Broadus John, APRN   fluconazole (DIFLUCAN) 100 MG tablet Take 1 tablet by mouth daily for 5 days Yes Broadus John, APRN   methylPREDNISolone (MEDROL, PAK,) 4 MG tablet Take with food. Yes Broadus John, APRN   fluticasone (FLONASE) 50 MCG/ACT nasal spray USE 2 SPRAYS IN EACH NOSTRIL EVERY DAY Yes Broadus John, APRN   lisinopril (PRINIVIL;ZESTRIL) 40 MG tablet TAKE ONE TABLET BY MOUTH EVERY DAY Yes Broadus John, APRN   nitroGLYCERIN (NITROSTAT) 0.4 MG SL tablet ONE TABLET UNDER TONGUE AS NEEDED FOR CHEST PAIN EVERY 5MINUTES. MAX OF 3 DOSES. IF NO RELIEF AFTER 1 DOSE CALL 911 Yes Broadus John, APRN   potassium chloride (KLOR-CON M) 20 MEQ extended release tablet TAKE 2 TABLETS BY MOUTH THREE TIMES DAILY Yes Broadus John, APRN   HYDROcodone-acetaminophen (NORCO) 5-325 MG per tablet Take 1 tablet by mouth every 6 hours as needed for Pain. Yes Historical Provider, MD   calcium carbonate (OSCAL) 500 MG TABS tablet Take 500 mg by mouth daily Yes Historical Provider, MD   gabapentin (NEURONTIN) 600 MG tablet Take 800 mg by mouth 4 times daily. Yes Historical Provider, MD   clonazePAM (KLONOPIN) 0.5 MG tablet Take 0.5 mg by mouth 2 times daily. Yes Historical Provider, MD  amLODIPine (NORVASC) 5 MG tablet Take 5 mg by mouth daily Yes Historical Provider, MD   butalbital-acetaminophen-caffeine (FIORICET, ESGIC) 50-325-40 MG per tablet Take 1 tablet by mouth every 4 hours as needed for Headaches Yes Shawn Rice, DO   dilTIAZem (CARDIZEM CD) 360 MG extended release capsule TAKE ONE CAPSULE BY MOUTH  EVERY DAY Yes Broadus John, APRN   cloNIDine (CATAPRES) 0.1 MG tablet TAKE ONE TABLET BY MOUTH TWICE DAILY Yes Broadus John, APRN   ondansetron (ZOFRAN) 4 MG tablet TAKE 1 TABLET BY MOUTH THREE TIMES DAILY AS NEEDED FOR NAUSEA OR VOMITING Yes Broadus John, APRN   escitalopram (LEXAPRO) 10 MG tablet TAKE 1 TABLET BY MOUTH DAILY Yes Broadus John, APRN   omeprazole (PRILOSEC) 40 MG delayed release capsule Take 1 capsule by mouth Daily Yes Broadus John, APRN   metoprolol succinate (TOPROL XL) 100 MG extended release tablet Take 1 tablet by mouth daily Yes Broadus John, APRN   Blood Glucose Monitoring Suppl (ONE TOUCH ULTRA 2) w/Device KIT 1 kit by Does not apply route daily Yes Broadus John, APRN   blood glucose test strips (ASCENSIA AUTODISC VI;ONE TOUCH ULTRA TEST VI) strip 1 each by In Vitro route daily QD and PRN Dx:DM2 Yes Broadus John, APRN   ferrous gluconate (FERGON) 324 (38 Fe) MG tablet Take 1 tablet by mouth daily (with breakfast) Yes Broadus John, APRN   traZODone (DESYREL) 100 MG tablet Take 1 tablet by mouth nightly Yes Broadus John, APRN   cyclobenzaprine (FLEXERIL) 10 MG tablet Take 1 tablet by mouth 3 times daily as needed for Muscle spasms Yes Broadus John, APRN   ibuprofen (ADVIL;MOTRIN) 800 MG tablet Take 1 tablet by mouth every 8 hours Yes Broadus John, APRN   levothyroxine (SYNTHROID) 125 MCG tablet Take 1 tablet by mouth Daily Yes Broadus John, APRN   magnesium oxide (MAG-OX) 400 MG tablet Take 1 tablet by mouth 2 times daily Yes Broadus John, APRN   promethazine (PHENERGAN) 25 MG tablet Take 1 tablet by mouth every 8 hours as needed for Nausea Yes Broadus John, APRN   EPINEPHrine (EPIPEN) 0.3 MG/0.3ML SOAJ injection Inject 0.3 mLs into the muscle as needed (allergic reaction) Use as directed for allergic reaction Yes Broadus John, APRN   albuterol sulfate HFA 108 (90 Base) MCG/ACT inhaler Inhale 2 puffs into the lungs every 4 hours as needed for Wheezing Yes Broadus John, APRN       ASSESSMENT:  1. Nonintractable headache, unspecified chronicity pattern, unspecified headache type    2. Neck pain    3. Dysuria        PLAN:  Orders Placed This Encounter   Medications   ??? SUMAtriptan (IMITREX) 100 MG tablet     Sig: Take 1 tablet by mouth once as needed for Migraine     Dispense:  9 tablet     Refill:  5   ??? fluconazole (DIFLUCAN) 100 MG tablet     Sig: Take 1 tablet by mouth daily for 5 days     Dispense:  10 tablet     Refill:  0   ??? dexamethasone (PF) (DECADRON) injection 10 mg   ??? methylPREDNISolone (MEDROL, PAK,) 4 MG tablet     Sig: Take with food.     Dispense:  1 kit     Refill:  0     Orders Placed This Encounter  Procedures   ??? External Referral To Neurology   ??? POCT Urinalysis no Micro     There are no Patient Instructions on file for this visit.   Return in about 3 months (around 09/19/2019).

## 2019-06-21 NOTE — Telephone Encounter (Signed)
Unable to reach or leave message.

## 2019-06-21 NOTE — Telephone Encounter (Signed)
-----   Message from Robin Searing, APRN sent at 06/21/2019  1:41 PM EDT -----  Let her know that the STD check was not sent . Tell her to come back by for it if her infection does not clear up.

## 2019-07-04 MED ORDER — NITROGLYCERIN 0.4 MG SL SUBL
0.4 MG | ORAL_TABLET | SUBLINGUAL | 0 refills | Status: DC
Start: 2019-07-04 — End: 2019-08-06

## 2019-07-09 MED ORDER — ALBUTEROL SULFATE HFA 108 (90 BASE) MCG/ACT IN AERS
108 (90 Base) MCG/ACT | RESPIRATORY_TRACT | 5 refills | Status: DC
Start: 2019-07-09 — End: 2019-10-03

## 2019-07-10 ENCOUNTER — Encounter

## 2019-07-10 ENCOUNTER — Inpatient Hospital Stay: Admit: 2019-07-10 | Payer: MEDICAID | Primary: Family

## 2019-07-10 DIAGNOSIS — N200 Calculus of kidney: Secondary | ICD-10-CM

## 2019-07-16 ENCOUNTER — Encounter: Primary: Family

## 2019-08-06 MED ORDER — NITROGLYCERIN 0.4 MG SL SUBL
0.4 MG | ORAL_TABLET | SUBLINGUAL | 2 refills | Status: DC
Start: 2019-08-06 — End: 2019-10-03

## 2019-08-06 MED ORDER — HYDROXYZINE PAMOATE 25 MG PO CAPS
25 MG | ORAL_CAPSULE | ORAL | 5 refills | Status: DC
Start: 2019-08-06 — End: 2020-03-15

## 2019-08-18 ENCOUNTER — Inpatient Hospital Stay
Admit: 2019-08-18 | Discharge: 2019-08-18 | Disposition: A | Discharge status: Home / Self Care | Payer: MEDICAID | Attending: Emergency Medicine

## 2019-08-18 ENCOUNTER — Emergency Department: Admit: 2019-08-18 | Payer: MEDICAID | Primary: Family

## 2019-08-18 DIAGNOSIS — N200 Calculus of kidney: Secondary | ICD-10-CM

## 2019-08-18 LAB — LIPASE: Lipase: 42 U/L (ref 5.6–51.3)

## 2019-08-18 LAB — CBC WITH AUTO DIFFERENTIAL
Basophils %: 0.5 %
Basophils Absolute: 0 10*3/uL (ref 0.0–0.1)
Eosinophils %: 2.3 %
Eosinophils Absolute: 0.2 10*3/uL (ref 0.0–0.4)
Hematocrit: 34.8 % — ABNORMAL LOW (ref 37.0–47.0)
Hemoglobin: 10.5 g/dL — ABNORMAL LOW (ref 11.5–16.5)
Immature Granulocytes #: 0 10*3/uL
Immature Granulocytes %: 0.4 % (ref 0.0–5.0)
Lymphocytes %: 32.4 %
Lymphocytes Absolute: 2.8 10*3/uL (ref 1.5–4.0)
MCH: 24.1 pg — ABNORMAL LOW (ref 27.0–32.0)
MCHC: 30.2 g/dL — ABNORMAL LOW (ref 31.0–35.0)
MCV: 80 fL (ref 80.0–100.0)
MPV: 10.2 fL — ABNORMAL HIGH (ref 6.0–10.0)
Monocytes %: 5.9 %
Monocytes Absolute: 0.5 10*3/uL (ref 0.2–0.8)
Neutrophils %: 58.5 %
Neutrophils Absolute: 5 10*3/uL (ref 2.0–7.5)
Platelets: 292 10*3/uL (ref 150–400)
RBC: 4.35 M/uL (ref 3.80–5.80)
RDW: 14.5 % (ref 11.0–16.0)
WBC: 8.5 10*3/uL (ref 4.0–11.0)

## 2019-08-18 LAB — URINALYSIS WITH REFLEX TO CULTURE
Bilirubin Urine: NEGATIVE
Blood, Urine: NEGATIVE
Glucose, Ur: NEGATIVE mg/dL
Ketones, Urine: NEGATIVE mg/dL
Leukocyte Esterase, Urine: NEGATIVE
Nitrite, Urine: NEGATIVE
Protein, UA: NEGATIVE mg/dL
Specific Gravity, UA: 1.02 (ref 1.005–1.030)
Urobilinogen, Urine: 0.2 E.U./dL (ref <2.0)
pH, UA: 7 (ref 5.0–8.0)

## 2019-08-18 LAB — COMPREHENSIVE METABOLIC PANEL W/ REFLEX TO MG FOR LOW K
ALT: 20 U/L (ref 4–36)
AST: 18 U/L (ref 8–33)
Albumin/Globulin Ratio: 1.8 (ref 0.8–2.0)
Albumin: 4.4 g/dL (ref 3.4–4.8)
Alkaline Phosphatase: 126 U/L — ABNORMAL HIGH (ref 25–100)
Anion Gap: 12 (ref 3–16)
BUN: 13 mg/dL (ref 6–20)
CO2: 20 mmol/L (ref 20–30)
Calcium: 9.1 mg/dL (ref 8.5–10.5)
Chloride: 107 mmol/L (ref 98–107)
Creatinine: 0.8 mg/dL (ref 0.4–1.2)
GFR African American: >59 (ref >59)
GFR Non-African American: >60 (ref >59)
Globulin: 2.5 g/dL
Glucose: 92 mg/dL (ref 74–106)
Potassium reflex Magnesium: 3.4 mmol/L (ref 3.4–5.1)
Sodium: 139 mmol/L (ref 136–145)
Total Bilirubin: <0.2 mg/dL — ABNORMAL LOW (ref 0.3–1.2)
Total Protein: 6.9 g/dL (ref 6.4–8.3)

## 2019-08-18 LAB — MAGNESIUM: Magnesium: 1.9 mg/dL (ref 1.7–2.4)

## 2019-08-18 MED ORDER — SODIUM CHLORIDE 0.9 % IV BOLUS
0.9 % | Freq: Once | INTRAVENOUS | Status: AC
Start: 2019-08-18 — End: 2019-08-18
  Administered 2019-08-18: 19:00:00 1000 mL via INTRAVENOUS

## 2019-08-18 MED ORDER — ONDANSETRON HCL 4 MG/2ML IJ SOLN
4 MG/2ML | Freq: Once | INTRAMUSCULAR | Status: AC
Start: 2019-08-18 — End: 2019-08-18
  Administered 2019-08-18: 19:00:00 4 mg via INTRAVENOUS

## 2019-08-18 MED ORDER — MORPHINE SULFATE 4 MG/ML IJ SOLN
4 MG/ML | Freq: Once | INTRAMUSCULAR | Status: AC
Start: 2019-08-18 — End: 2019-08-18
  Administered 2019-08-18: 19:00:00 4 mg via INTRAVENOUS

## 2019-08-18 MED FILL — ONDANSETRON HCL 4 MG/2ML IJ SOLN: 4 MG/2ML | INTRAMUSCULAR | Qty: 2

## 2019-08-18 MED FILL — MORPHINE SULFATE 4 MG/ML IJ SOLN: 4 mg/mL | INTRAMUSCULAR | Qty: 1

## 2019-08-18 NOTE — ED Notes (Signed)
B/P reported to Dr. Cherlynn Polo. Pt states her blood pressure stays that way. Pt encouraged to follow with PCP r/t B/P. Dr. Cherlynn Polo ok for pt to be discharged.     Gaetano Net, RN  08/18/19 1425

## 2019-08-18 NOTE — ED Provider Notes (Signed)
[facility] ENCOUNTER      Pt Name: Kristin Munoz  MRN: [id number]  [place] [birth year]  Date of evaluation: 08/18/2019  Provider: Boykin Peek, DO    CHIEF COMPLAINT       Chief Complaint   Patient presents with   ??? Back Pain     Lower left side. Pt has kidney stones. Due to have removed 09/06/19 by Dr Saul Fordyce         HISTORY OF PRESENT ILLNESS   (Location/Symptom, Timing/Onset, Context/Setting, Quality, Duration, Modifying Factors, Severity)  Note limiting factors.   Kristin Munoz is a 38 y.o. female with history of multiple kidney stones who presents to the emergency department with complaint of left flank pain.  Patient reports she is had multiple kidney stones in the past.  She follows up with Dr. Bennie Pierini out of [facility].  States she had a kidney stone with a stent placed in early October.  Had a CT scan on October 13 showing interval passage or removal of a previously described left renal calculus and a stable nonobstructive punctate calculus at the right inferior pole.  States she followed up with her urologist the next day to have a stone removed from her kidney.  Reports over the past 2 days she has been having worsening left flank pain.  She states she is scheduled for another stone removal on December 10 with her urologist.  She has been taking Percocet 10 mg at home but has no more left for her pain.  She reports nausea without vomiting.  Reports some hematuria.  Denies dysuria, urgency or frequency.        Appropriate PPE was used including an eye shield, gloves, N95 mask, surgical mask during the entire patient encounter and exam.  If necessary (pt being intubated or aerosolizing equipment in use) a gown was also used.    Nursing Notes were reviewed.      PAST MEDICAL HISTORY     Past Medical History:   Diagnosis Date   ??? Anemia    ??? Arthritis    ??? Asthma    ??? Atrial fibrillation ([place])    ??? CAD (coronary artery disease)    ??? Cancer  (HCC)     ovarian, bone, breast and colon   ??? Cardiac arrest [facility])     patient states that her heart stopped and she had to be revived by her daughter   ??? Cerebral artery occlusion with cerebral infarction [facility])    ??? CHF (congestive heart failure) ([place])    ??? Chronic kidney disease    ??? COPD (chronic obstructive pulmonary disease) ([place])    ??? Depression    ??? Diabetes mellitus ([place])     diet control   ??? Headache    ??? Hypertension    ??? Hypokalemia    ??? Kidney stone    ??? Liver failure ([place])    ??? Myocardial infarction ([place]) 06/2016   ??? Pulmonary embolism ([place])    ??? Thyroid disease          SURGICAL HISTORY       Past Surgical History:   Procedure Laterality Date   ??? APPENDECTOMY     ??? BACK SURGERY     ??? BREAST LUMPECTOMY Left    ??? BREAST SURGERY     ??? CHOLECYSTECTOMY     ??? HEMORRHOID SURGERY     ??? HIP SURGERY     ??? HYSTERECTOMY     ???  KIDNEY STONE SURGERY      x 72 per patient   ??? KNEE SURGERY Left    ??? LAPAROSCOPY      x36 per patient   ??? LUNG SURGERY      for pulmonary embolisms         CURRENT MEDICATIONS       Previous Medications    ALBUTEROL SULFATE HFA 108 (90 BASE) MCG/ACT INHALER    INHALE 2 PUFFS BY MOUTH EVERY 4 HOURS AS NEEDED FOR WHEEZING    AMLODIPINE (NORVASC) 5 MG TABLET    Take 5 mg by mouth daily    BLOOD GLUCOSE MONITORING SUPPL (ONE TOUCH ULTRA 2) W/DEVICE KIT    1 kit by Does not apply route daily    BLOOD GLUCOSE TEST STRIPS (ASCENSIA AUTODISC VI;ONE TOUCH ULTRA TEST VI) STRIP    1 each by In Vitro route daily QD and PRN Dx:DM2    CALCIUM CARBONATE (OSCAL) 500 MG TABS TABLET    Take 500 mg by mouth daily    CLONAZEPAM (KLONOPIN) 0.5 MG TABLET    Take 0.5 mg by mouth 2 times daily.    CLONIDINE (CATAPRES) 0.1 MG TABLET    TAKE ONE TABLET BY MOUTH TWICE DAILY    CYCLOBENZAPRINE (FLEXERIL) 10 MG TABLET    Take 1 tablet by mouth 3 times daily as needed for Muscle spasms    DILTIAZEM (CARDIZEM CD) 360 MG EXTENDED RELEASE CAPSULE    TAKE ONE CAPSULE BY MOUTH EVERY DAY    EPINEPHRINE (EPIPEN) 0.3 MG/0.3ML  SOAJ INJECTION    Inject 0.3 mLs into the muscle as needed (allergic reaction) Use as directed for allergic reaction    ESCITALOPRAM (LEXAPRO) 10 MG TABLET    TAKE 1 TABLET BY MOUTH DAILY    FERROUS GLUCONATE (FERGON) 324 (38 FE) MG TABLET    Take 1 tablet by mouth daily (with breakfast)    FLUTICASONE (FLONASE) 50 MCG/ACT NASAL SPRAY    USE 2 SPRAYS IN EACH NOSTRIL EVERY DAY    GABAPENTIN (NEURONTIN) 600 MG TABLET    Take 800 mg by mouth 4 times daily.    HYDROXYZINE (VISTARIL) 25 MG CAPSULE    TAKE 1 CAPSULE BY MOUTH THREE TIMES DAILY AS NEEDED FOR ANXIETY    IBUPROFEN (ADVIL;MOTRIN) 800 MG TABLET    Take 1 tablet by mouth every 8 hours    LEVOTHYROXINE (SYNTHROID) 125 MCG TABLET    Take 1 tablet by mouth Daily    LISINOPRIL (PRINIVIL;ZESTRIL) 40 MG TABLET    TAKE ONE TABLET BY MOUTH EVERY DAY    MAGNESIUM OXIDE (MAG-OX) 400 MG TABLET    Take 1 tablet by mouth 2 times daily    METOPROLOL SUCCINATE (TOPROL XL) 100 MG EXTENDED RELEASE TABLET    Take 1 tablet by mouth daily    NITROGLYCERIN (NITROSTAT) 0.4 MG SL TABLET    ONE TABLET UNDER TONGUE AS NEEDED FOR CHEST PAIN EVERY 5MINUTES. MAX OF 3 DOSES. IF NO RELIEF AFTER 1 DOSE CALL 911    OMEPRAZOLE (PRILOSEC) 40 MG DELAYED RELEASE CAPSULE    Take 1 capsule by mouth Daily    ONDANSETRON (ZOFRAN) 4 MG TABLET    TAKE 1 TABLET BY MOUTH THREE TIMES DAILY AS NEEDED FOR NAUSEA OR VOMITING    POTASSIUM CHLORIDE (KLOR-CON M) 20 MEQ EXTENDED RELEASE TABLET    TAKE 2 TABLETS BY MOUTH THREE TIMES DAILY    PROMETHAZINE (PHENERGAN) 25 MG TABLET    Take 1 tablet  by mouth every 8 hours as needed for Nausea    SUMATRIPTAN (IMITREX) 100 MG TABLET    Take 1 tablet by mouth once as needed for Migraine    TRAZODONE (DESYREL) 100 MG TABLET    Take 1 tablet by mouth nightly       ALLERGIES     Macadamia nut oil; Naproxen; Pcn [penicillins]; Peanut-containing drug products; Sweet potato; Tizanidine; Toradol [ketorolac tromethamine]; Zanaflex [tizanidine hcl]; Desmopressin acetate;  Pertussis vaccines; Polio virus vaccine live oral trivalent; Tetanus toxoids; Levaquin [levofloxacin in d5w]; and Levofloxacin    FAMILY HISTORY     History reviewed. No pertinent family history.       SOCIAL HISTORY       Social History     Socioeconomic History   ??? Marital status: Married     Spouse name: None   ??? Number of children: None   ??? Years of education: None   ??? Highest education level: None   Occupational History   ??? None   Social Needs   ??? Financial resource strain: None   ??? Food insecurity     Worry: None     Inability: None   ??? Transportation needs     Medical: None     Non-medical: None   Tobacco Use   ??? Smoking status: Never Smoker   ??? Smokeless tobacco: Never Used   Substance and Sexual Activity   ??? Alcohol use: No   ??? Drug use: Yes     Types: Marijuana   ??? Sexual activity: None   Lifestyle   ??? Physical activity     Days per week: None     Minutes per session: None   ??? Stress: None   Relationships   ??? Social Product manager on phone: None     Gets together: None     Attends religious service: None     Active member of club or organization: None     Attends meetings of clubs or organizations: None     Relationship status: None   ??? Intimate partner violence     Fear of current or ex partner: None     Emotionally abused: None     Physically abused: None     Forced sexual activity: None   Other Topics Concern   ??? None   Social History Narrative   ??? None       SCREENINGS               Review of Systems  Constitutional:  Denies fever, chills  Eyes: denies eye problems  HEENT: denies sore throat or ear pain  Respiratory: denies cough or shortness of breath  Cardiovascular: denies chest pain, palpitations  GI: Reports left flank pain and nausea  Musculoskeletal: denies joint pain  Skin: denies rash  Neurologic: denies focal weakness or sensory changes    Except as noted above the remainder of the review of systems was reviewed and negative.       PHYSICAL EXAM    (up to 7 for level 4, 8 or more for  level 5)     ED Triage Vitals [08/18/19 1251]   BP Temp Temp Source Pulse Resp SpO2 Height Weight   (!) 167/111 98.2 ??F (36.8 ??C) Temporal 96 20 99 % _0  (1.575 m) 200 lb (90.7 kg)       General appearance: well-developed, well-nourished, no acute distress, nontoxic appearance  HENT: normocephalic, atraumatic, oropharynx moist, nares  patent  Neck: normal range of motion, no tenderness, trachea midline, no stridor  Respiratory: normal breath sounds, non labored breathing pattern  Cardiovascular: normal heart rate, normal rhythm  GI: Diffuse tenderness to palpation worse on the left side, no rebound, guarding or rigidity, bowel sounds normal, soft, nondistended, no pulsatile masses  Musculoskeletal: no edema, intact distal pulses, no clubbing, cyanosis. Good range of motion  Back: Mild CVA tenderness on the left  Integument: warm, dry, no erythema, no rash, < 2 second cap refill  Neurologic: alert and oriented ??3, no focal deficits appreciated    DIAGNOSTIC RESULTS         RADIOLOGY:   Interpretation per the Radiologist below, if available at the time of this note:    CT ABDOMEN PELVIS WO CONTRAST Additional Contrast? None   Final Result      No obstructive uropathy.      Bilateral nephrolithiasis      Moderate-sized hiatal hernia.      Fatty infiltration of the liver            LABS:  Labs Reviewed   CBC WITH AUTO DIFFERENTIAL - Abnormal; Notable for the following components:       Result Value    Hemoglobin 10.5 (*)     Hematocrit 34.8 (*)     MCH 24.1 (*)     MCHC 30.2 (*)     MPV 10.2 (*)     All other components within normal limits    Narrative:     Performed at:  [place] [facility]  [address]  [place] [address]   Phone [phone]   COMPREHENSIVE METABOLIC PANEL W/ REFLEX TO MG FOR LOW K - Abnormal; Notable for the following components:    Total Bilirubin <0.2 (*)     Alkaline Phosphatase 126 (*)     All other components within normal limits    Narrative:      Performed at:  [place] [facility]  [address],  [place] [address]   Phone [phone]   URINE RT REFLEX TO CULTURE    Narrative:     Performed at:  [place] [facility]  [address],  [place] [address]   Phone [phone]   LIPASE    Narrative:     Performed at:  [place] [facility]  [address],  [place] [address]   Phone [phone]   MAGNESIUM       All other labs were within normal range or not returned as of this dictation.    EMERGENCY DEPARTMENT COURSE and DIFFERENTIAL DIAGNOSIS/MDM:   Vitals:    Vitals:    08/18/19 1251   BP: (!) 167/111   Pulse: 96   Resp: 20   Temp: 98.2 ??F (36.8 ??C)   TempSrc: Temporal   SpO2: 99%   Weight: 200 lb (90.7 kg)   Height: _0  (1.575 m)         MDM  38 year old female presents the emergency department complaint of left flank pain and concern for retained stone.  Vital signs stable.  Physical exam as above.  She appears alert and awake and overall nontoxic.  Does appear in pain.  Abdominal exam has some tenderness but no peritoneal signs.  Will obtain CT abdomen pelvis that she was post to have one  before her December 10 visit, obtain abdominal labs, give morphine, Zofran and IV fluids for symptoms.    Work-up shows CBC without leukocytosis.  Hemoglobin at 10.5 without active signs of bleeding.  CMP shows mildly bumped alk phos which is been slightly elevated in the past.  No right upper quadrant tenderness to palpation.  Kidney function within normal limits.  Urinalysis without signs of infection or red blood cells.  Lipase normal.  CT scan shows nonobstructing less than 3 mm stones in the bilateral kidneys.  No signs of obstructive nephropathy.  Discussed results with the patient.  At this point she can follow-up with her urologist and does appear stable to be discharged home.  Can take Tylenol for pain.  Return precautions  given.  She agrees with discharge plan.    CONSULTS:  None      FINAL IMPRESSION      1. Bilateral nephrolithiasis          DISPOSITION/PLAN   DISPOSITION Decision To Discharge 08/18/2019 02:02:17 PM      PATIENT REFERRED TO:  Broadus Haleemah Buckalew, APRN  [address]  [place] [address]  [phone]    Schedule an appointment as soon as possible for a visit in 2 days  As needed, If symptoms worsen    Damita Lack  [address]  [place] [address]  [phone]    Schedule an appointment as soon as possible for a visit in 2 days  As needed, If symptoms worsen      DISCHARGE MEDICATIONS:  New Prescriptions    No medications on file          (Please note that portions of this note were completed with a voice recognition program.  Efforts were made to edit the dictations but occasionally words are mis-transcribed.)    Boykin Peek, DO (electronically signed)  Attending Emergency Physician        Boykin Peek, DO  08/18/19 1405

## 2019-08-18 NOTE — Discharge Instructions (Addendum)
Your CAT scan only showed stones within your kidney. Nothing within the ureter or kidney tubes.  You can follow-up with your urologist as an outpatient.  Take Tylenol for pain. Return to the emergency department you have worsening abdominal pain, nausea, vomiting, unable to eat or drink or any new or concerning symptoms arise.    CT ABDOMEN PELVIS WO CONTRAST Additional Contrast? None (Final result)   Result time 08/18/19 13:29:58   Final result by Elder Negus, MD (08/18/19 13:29:58)                 Impression:       No obstructive uropathy.     Bilateral nephrolithiasis     Moderate-sized hiatal hernia.     Fatty infiltration of the liver

## 2019-08-18 NOTE — ED Notes (Signed)
Pt/family given d/c orders verbally and written. Pt/Family with no questions or concerns r/t d/c. Pt ambulatory to POV. No acute distress noted on d/c.     Gaetano Net, RN  08/18/19 1426

## 2019-09-03 MED ORDER — OMEPRAZOLE 40 MG PO CPDR
40 MG | ORAL_CAPSULE | Freq: Every day | ORAL | 5 refills | Status: DC
Start: 2019-09-03 — End: 2020-02-26

## 2019-09-03 MED ORDER — AMLODIPINE BESYLATE 5 MG PO TABS
5 MG | ORAL_TABLET | ORAL | 5 refills | Status: DC
Start: 2019-09-03 — End: 2020-02-26

## 2019-09-03 MED ORDER — METOPROLOL SUCCINATE ER 100 MG PO TB24
100 MG | ORAL_TABLET | Freq: Every day | ORAL | 5 refills | Status: DC
Start: 2019-09-03 — End: 2020-02-26

## 2019-09-18 ENCOUNTER — Encounter: Attending: Family | Primary: Family

## 2019-09-25 ENCOUNTER — Encounter: Attending: Family | Primary: Family

## 2019-10-03 MED ORDER — NITROGLYCERIN 0.4 MG SL SUBL
0.4 MG | ORAL_TABLET | SUBLINGUAL | 2 refills | Status: AC
Start: 2019-10-03 — End: ?

## 2019-10-03 MED ORDER — ONDANSETRON HCL 4 MG PO TABS
4 MG | ORAL_TABLET | ORAL | 2 refills | Status: AC
Start: 2019-10-03 — End: ?

## 2019-10-03 MED ORDER — ALBUTEROL SULFATE HFA 108 (90 BASE) MCG/ACT IN AERS
108 (90 Base) MCG/ACT | RESPIRATORY_TRACT | 5 refills | Status: DC
Start: 2019-10-03 — End: 2020-02-26

## 2019-10-03 MED ORDER — FLUCONAZOLE 100 MG PO TABS
100 MG | ORAL_TABLET | Freq: Every day | ORAL | 0 refills | Status: AC
Start: 2019-10-03 — End: 2019-10-08

## 2019-10-03 MED ORDER — CYCLOBENZAPRINE HCL 10 MG PO TABS
10 MG | ORAL_TABLET | ORAL | 5 refills | Status: DC
Start: 2019-10-03 — End: 2020-03-15

## 2019-10-03 MED ORDER — ESCITALOPRAM OXALATE 10 MG PO TABS
10 MG | ORAL_TABLET | Freq: Every day | ORAL | 5 refills | Status: DC
Start: 2019-10-03 — End: 2020-03-15

## 2019-10-23 ENCOUNTER — Encounter

## 2019-10-23 ENCOUNTER — Inpatient Hospital Stay: Payer: MEDICAID | Primary: Family

## 2019-10-23 ENCOUNTER — Inpatient Hospital Stay: Admit: 2019-10-23 | Payer: MEDICAID | Primary: Family

## 2019-10-23 DIAGNOSIS — N200 Calculus of kidney: Secondary | ICD-10-CM

## 2019-10-23 LAB — COMPREHENSIVE METABOLIC PANEL
ALT: 20 U/L (ref 4–36)
AST: 19 U/L (ref 8–33)
Albumin/Globulin Ratio: 1.7 (ref 0.8–2.0)
Albumin: 4.2 g/dL (ref 3.4–4.8)
Anion Gap: 13 (ref 3–16)
CO2: 22 mmol/L (ref 20–30)
Calcium: 10 mg/dL (ref 8.5–10.5)
Chloride: 107 mmol/L (ref 98–107)
Creatinine: 0.9 mg/dL (ref 0.4–1.2)
GFR African American: >59 (ref >59)
GFR Non-African American: >60 (ref >59)
Globulin: 2.5 g/dL
Glucose: 122 mg/dL — ABNORMAL HIGH (ref 74–106)
Sodium: 142 mmol/L (ref 136–145)
Total Bilirubin: <0.2 mg/dL — ABNORMAL LOW (ref 0.3–1.2)
Total Protein: 6.7 g/dL (ref 6.4–8.3)

## 2019-10-23 LAB — MAGNESIUM: Magnesium: 1.6 mg/dL — ABNORMAL LOW (ref 1.7–2.4)

## 2019-11-01 MED ORDER — LEVOTHYROXINE SODIUM 125 MCG PO TABS
125 MCG | ORAL_TABLET | Freq: Every day | ORAL | 5 refills | Status: DC
Start: 2019-11-01 — End: 2020-03-15

## 2019-11-01 MED ORDER — TRAZODONE HCL 100 MG PO TABS
100 MG | ORAL_TABLET | ORAL | 5 refills | Status: DC
Start: 2019-11-01 — End: 2020-03-15

## 2019-11-01 MED ORDER — DILTIAZEM HCL ER COATED BEADS 360 MG PO CP24
360 MG | ORAL_CAPSULE | ORAL | 5 refills | Status: DC
Start: 2019-11-01 — End: 2020-03-13

## 2019-11-01 MED ORDER — CLONIDINE HCL 0.1 MG PO TABS
0.1 MG | ORAL_TABLET | ORAL | 5 refills | Status: DC
Start: 2019-11-01 — End: 2020-03-13

## 2019-11-02 MED ORDER — MAGNESIUM-OXIDE 400 (241.3 MG) MG PO TABS
400 (241.3 Mg) MG | ORAL_TABLET | ORAL | 5 refills | Status: DC
Start: 2019-11-02 — End: 2020-03-15

## 2019-11-12 MED ORDER — IBUPROFEN 800 MG PO TABS
800 MG | ORAL_TABLET | Freq: Three times a day (TID) | ORAL | 5 refills | Status: DC
Start: 2019-11-12 — End: 2020-03-13

## 2019-12-03 MED ORDER — POTASSIUM CHLORIDE CRYS ER 20 MEQ PO TBCR
20 MEQ | ORAL_TABLET | ORAL | 5 refills | Status: DC
Start: 2019-12-03 — End: 2020-06-04

## 2019-12-06 MED ORDER — SODIUM CHLORIDE FLUSH 0.9 % IV SOLN
0.9 | INTRAVENOUS | Status: DC | PRN
Start: 2019-12-06 — End: 2019-12-06

## 2019-12-06 MED ORDER — GENERIC EXTERNAL MEDICATION
Status: DC
Start: 2019-12-06 — End: 2019-12-06

## 2019-12-24 ENCOUNTER — Encounter

## 2019-12-24 ENCOUNTER — Inpatient Hospital Stay: Admit: 2019-12-24 | Payer: MEDICAID | Primary: Family

## 2019-12-24 ENCOUNTER — Inpatient Hospital Stay: Payer: MEDICAID | Primary: Family

## 2019-12-24 DIAGNOSIS — N200 Calculus of kidney: Secondary | ICD-10-CM

## 2019-12-24 LAB — URINALYSIS
Bilirubin Urine: NEGATIVE
Blood, Urine: NEGATIVE
Glucose, Ur: NEGATIVE mg/dL
Ketones, Urine: NEGATIVE mg/dL
Leukocyte Esterase, Urine: NEGATIVE
Nitrite, Urine: NEGATIVE
Protein, UA: NEGATIVE mg/dL
Specific Gravity, UA: 1.01 (ref 1.005–1.030)
Urobilinogen, Urine: 0.2 E.U./dL (ref <2.0)
pH, UA: 6 (ref 5.0–8.0)

## 2019-12-25 LAB — CULTURE, URINE: Urine Culture, Routine: <50000

## 2020-01-02 MED ORDER — SUMATRIPTAN SUCCINATE 100 MG PO TABS
100 MG | ORAL_TABLET | ORAL | 5 refills | Status: AC
Start: 2020-01-02 — End: ?

## 2020-01-14 MED ORDER — LISINOPRIL 40 MG PO TABS
40 MG | ORAL_TABLET | ORAL | 5 refills | Status: DC
Start: 2020-01-14 — End: 2020-03-15

## 2020-01-14 MED ORDER — FERROUS GLUCONATE 324 (38 FE) MG PO TABS
324 (38 Fe) MG | ORAL_TABLET | ORAL | 5 refills | Status: DC
Start: 2020-01-14 — End: 2020-03-15

## 2020-01-21 MED ORDER — EPINEPHRINE 0.3 MG/0.3ML IJ SOAJ
0.3 | INTRAMUSCULAR | 2 refills | 30.00000 days | Status: AC | PRN
Start: 2020-01-21 — End: ?

## 2020-01-21 MED ORDER — PROMETHAZINE HCL 25 MG PO TABS
25 MG | ORAL_TABLET | Freq: Three times a day (TID) | ORAL | 5 refills | Status: AC | PRN
Start: 2020-01-21 — End: ?

## 2020-01-24 ENCOUNTER — Encounter: Payer: MEDICAID | Attending: Family | Primary: Family

## 2020-01-25 MED ORDER — SODIUM CHLORIDE FLUSH 0.9 % IV SOLN
0.9 | INTRAVENOUS | Status: DC | PRN
Start: 2020-01-25 — End: 2020-01-25

## 2020-01-25 MED ORDER — GENERIC EXTERNAL MEDICATION
Status: DC
Start: 2020-01-25 — End: 2020-01-25

## 2020-01-28 NOTE — Telephone Encounter (Signed)
From: Okey Regal  To: Robin Searing, APRN  Sent: 01/28/2020 1:16 AM EDT  Subject: Visit Follow-Up Question    I had an appointment on 4/29/2. I was transported by ambulance to [facility] that day. I need a follow up appointment but I have surgery tomorrow at 10:30 [facility]. I need to get in to see Pam as soon as possible.

## 2020-01-30 MED ORDER — FLUTICASONE PROPIONATE 50 MCG/ACT NA SUSP
50 MCG/ACT | NASAL | 5 refills | Status: DC
Start: 2020-01-30 — End: 2020-03-13

## 2020-02-07 ENCOUNTER — Encounter: Attending: Family | Primary: Family

## 2020-02-26 MED ORDER — AMLODIPINE BESYLATE 5 MG PO TABS
5 MG | ORAL_TABLET | ORAL | 0 refills | Status: DC
Start: 2020-02-26 — End: 2020-03-15

## 2020-02-26 MED ORDER — OMEPRAZOLE 40 MG PO CPDR
40 MG | ORAL_CAPSULE | Freq: Every day | ORAL | 0 refills | Status: DC
Start: 2020-02-26 — End: 2020-03-13

## 2020-02-26 MED ORDER — PROAIR HFA 108 (90 BASE) MCG/ACT IN AERS
108 (90 Base) MCG/ACT | RESPIRATORY_TRACT | 0 refills | Status: DC
Start: 2020-02-26 — End: 2020-03-19

## 2020-02-26 MED ORDER — METOPROLOL SUCCINATE ER 100 MG PO TB24
100 MG | ORAL_TABLET | Freq: Every day | ORAL | 0 refills | Status: DC
Start: 2020-02-26 — End: 2020-03-15

## 2020-02-28 LAB — HEMOGLOBIN A1C: Hemoglobin A1C, External: 5.1 % (ref 4.80–5.60)

## 2020-03-06 MED ORDER — PANTOPRAZOLE SODIUM 40 MG PO TBEC
40 MG | ORAL | Status: DC
Start: 2020-03-06 — End: 2020-03-06

## 2020-03-06 MED ORDER — GLUCAGON (RDNA) 1 MG IJ KIT
1 MG | INTRAMUSCULAR | Status: DC
Start: 2020-03-06 — End: 2020-03-06

## 2020-03-06 MED ORDER — LEVOTHYROXINE SODIUM 125 MCG PO TABS
125 MCG | ORAL | Status: DC
Start: 2020-03-06 — End: 2020-03-06

## 2020-03-06 MED ORDER — LABETALOL HCL 5 MG/ML IV SOLN
5 MG/ML | INTRAVENOUS | Status: DC | PRN
Start: 2020-03-06 — End: 2020-03-06

## 2020-03-06 MED ORDER — TRAZODONE HCL 50 MG PO TABS
50 MG | Freq: Every evening | ORAL | Status: DC
Start: 2020-03-06 — End: 2020-03-06

## 2020-03-06 MED ORDER — SUMATRIPTAN SUCCINATE 50 MG PO TABS
50 MG | ORAL | Status: DC | PRN
Start: 2020-03-06 — End: 2020-03-06

## 2020-03-06 MED ORDER — SUCRALFATE 1 G PO TABS
1 GM | Freq: Four times a day (QID) | ORAL | Status: DC
Start: 2020-03-06 — End: 2020-03-06

## 2020-03-06 MED ORDER — HYDROXYZINE PAMOATE 25 MG PO CAPS
25 MG | Freq: Three times a day (TID) | ORAL | Status: DC | PRN
Start: 2020-03-06 — End: 2020-03-06

## 2020-03-06 MED ORDER — ESCITALOPRAM OXALATE 10 MG PO TABS
10 MG | Freq: Every evening | ORAL | Status: DC
Start: 2020-03-06 — End: 2020-03-06

## 2020-03-06 MED ORDER — SODIUM CHLORIDE FLUSH 0.9 % IV SOLN
0.9 % | INTRAVENOUS | Status: DC | PRN
Start: 2020-03-06 — End: 2020-03-06

## 2020-03-06 MED ORDER — METOPROLOL TARTRATE 25 MG PO TABS
25 MG | Freq: Two times a day (BID) | ORAL | Status: DC
Start: 2020-03-06 — End: 2020-03-06

## 2020-03-06 MED ORDER — ONDANSETRON HCL 4 MG/2ML IJ SOLN
4 MG/2ML | Freq: Four times a day (QID) | INTRAMUSCULAR | Status: DC | PRN
Start: 2020-03-06 — End: 2020-03-06

## 2020-03-06 MED ORDER — FERROUS SULFATE 324 MG PO TBEC
324 MG | Freq: Every day | ORAL | Status: DC
Start: 2020-03-06 — End: 2020-03-06

## 2020-03-06 MED ORDER — TAMSULOSIN HCL 0.4 MG PO CAPS
0.4 MG | Freq: Every evening | ORAL | Status: DC
Start: 2020-03-06 — End: 2020-03-06

## 2020-03-06 MED ORDER — SODIUM CHLORIDE FLUSH 0.9 % IV SOLN
0.9 % | Freq: Two times a day (BID) | INTRAVENOUS | Status: DC
Start: 2020-03-06 — End: 2020-03-06

## 2020-03-06 MED ORDER — GENERIC EXTERNAL MEDICATION
Freq: Every day | Status: DC
Start: 2020-03-06 — End: 2020-03-06

## 2020-03-06 MED ORDER — DEXTROSE 50 % IV SOLN
50 % | INTRAVENOUS | Status: DC
Start: 2020-03-06 — End: 2020-03-06

## 2020-03-06 MED ORDER — LISINOPRIL 20 MG PO TABS
20 MG | ORAL | Status: DC
Start: 2020-03-06 — End: 2020-03-06

## 2020-03-06 MED ORDER — SODIUM CHLORIDE 0.9 % IV SOLN
0.9 % | INTRAVENOUS | Status: DC
Start: 2020-03-06 — End: 2020-03-06

## 2020-03-06 MED ORDER — PROMETHAZINE HCL 12.5 MG PO TABS
12.5 MG | ORAL | Status: DC | PRN
Start: 2020-03-06 — End: 2020-03-06

## 2020-03-06 MED ORDER — CYCLOBENZAPRINE HCL 10 MG PO TABS
10 MG | ORAL | Status: DC
Start: 2020-03-06 — End: 2020-03-06

## 2020-03-06 MED ORDER — OXYCODONE HCL 5 MG PO TABS
5 MG | ORAL | Status: DC | PRN
Start: 2020-03-06 — End: 2020-03-06

## 2020-03-10 NOTE — Care Coordination-Inpatient (Signed)
[facility] Care Transitions Initial Follow Up Call    Call within 2 business days of discharge: Yes     Patient: Kristin Munoz Patient DOB: [birth year] MRN: <[id number]>    Last Discharge [facility]       Complaint Diagnosis Description Type Department Provider    08/18/19 Back Pain Bilateral nephrolithiasis ED (DISCHARGE) [facility] Rueben Bash, DO   DC  [facility] 03/06/20  GI Bleed , Liver failure       RARS: No data recorded     Spoke with: Attempted HFU call, unsuccessful.  Message left with contact information.     Discharge department/facility: [facility]    Non-face-to-face services provided:  Scheduled appointment with PCP-Jones  Obtained and reviewed discharge summary and/or continuity of care documents    Follow Up  Future Appointments   Date Time Provider Department Center   03/21/2020  1:15 PM Robin Searing, APRN [id number]       Donnetta Hail, RN

## 2020-03-13 ENCOUNTER — Inpatient Hospital Stay
Admission: EM | Admit: 2020-03-13 | Discharge: 2020-03-15 | Disposition: A | Discharge status: Home / Self Care | Payer: MEDICAID | Source: Other Acute Inpatient Hospital | Admitting: Internal Medicine

## 2020-03-13 ENCOUNTER — Emergency Department: Admit: 2020-03-13 | Payer: MEDICAID | Primary: Family

## 2020-03-13 DIAGNOSIS — N12 Tubulo-interstitial nephritis, not specified as acute or chronic: Principal | ICD-10-CM

## 2020-03-13 LAB — CBC WITH AUTO DIFFERENTIAL
Basophils %: 0.7 %
Basophils Absolute: 0.1 10*3/uL (ref 0.0–0.1)
Eosinophils %: 1.7 %
Eosinophils Absolute: 0.2 10*3/uL (ref 0.0–0.4)
Hematocrit: 34.4 % — ABNORMAL LOW (ref 37.0–47.0)
Hemoglobin: 10.5 g/dL — ABNORMAL LOW (ref 11.5–16.5)
Immature Granulocytes #: 0 10*3/uL
Immature Granulocytes %: 0.3 % (ref 0.0–5.0)
Lymphocytes %: 33.6 %
Lymphocytes Absolute: 3.2 10*3/uL (ref 1.5–4.0)
MCH: 24.1 pg — ABNORMAL LOW (ref 27.0–32.0)
MCHC: 30.5 g/dL — ABNORMAL LOW (ref 31.0–35.0)
MCV: 79.1 fL — ABNORMAL LOW (ref 80.0–100.0)
MPV: 11 fL — ABNORMAL HIGH (ref 6.0–10.0)
Monocytes %: 8.4 %
Monocytes Absolute: 0.8 10*3/uL (ref 0.2–0.8)
Neutrophils %: 55.3 %
Neutrophils Absolute: 5.2 10*3/uL (ref 2.0–7.5)
Platelets: 313 10*3/uL (ref 150–400)
RBC: 4.35 M/uL (ref 3.80–5.80)
RDW: 22.2 % — ABNORMAL HIGH (ref 11.0–16.0)
WBC: 9.4 10*3/uL (ref 4.0–11.0)

## 2020-03-13 LAB — URINALYSIS
Glucose, Ur: NEGATIVE mg/dL
Nitrite, Urine: NEGATIVE
Protein, UA: 100 mg/dL — AB
Specific Gravity, UA: >=1.03 (ref 1.005–1.030)
Urobilinogen, Urine: 1 E.U./dL (ref <2.0)
pH, UA: 6 (ref 5.0–8.0)

## 2020-03-13 LAB — DRUG SCREEN MULTI URINE
Amphetamine Screen, Urine: POSITIVE — AB (ref <1000)
Barbiturate Screen, Ur: NEGATIVE (ref <200)
Benzodiazepine Screen, Urine: POSITIVE — AB (ref <300)
Buprenorphine Qual, Urine: NEGATIVE (ref <25)
Cannabinoid Scrn, Ur: POSITIVE — AB (ref <50)
Cocaine Metabolite Screen, Urine: NEGATIVE (ref <300)
Methadone Screen, Urine: NEGATIVE (ref <300)
Methamphetamine, Urine: POSITIVE — AB (ref <1000)
Opiate Scrn, Ur: NEGATIVE (ref <300)
PCP Screen, Urine: NEGATIVE (ref <25)
Propoxyphene Screen, Urine: NEGATIVE (ref <300)
Tricyclic: POSITIVE — AB (ref <300)
UR Oxycodone Rapid Screen: POSITIVE — AB (ref <100)

## 2020-03-13 LAB — COMPREHENSIVE METABOLIC PANEL W/ REFLEX TO MG FOR LOW K
ALT: 66 U/L — ABNORMAL HIGH (ref 4–36)
AST: 43 U/L — ABNORMAL HIGH (ref 8–33)
Albumin/Globulin Ratio: 1.1 (ref 0.8–2.0)
Albumin: 3.9 g/dL (ref 3.4–4.8)
Alkaline Phosphatase: 160 U/L — ABNORMAL HIGH (ref 25–100)
Anion Gap: 15 (ref 3–16)
BUN: 11 mg/dL (ref 6–20)
CO2: 23 mmol/L (ref 20–30)
Calcium: 9.3 mg/dL (ref 8.5–10.5)
Chloride: 105 mmol/L (ref 98–107)
Creatinine: 0.8 mg/dL (ref 0.4–1.2)
GFR African American: >59 (ref >59)
GFR Non-African American: >60 (ref >59)
Globulin: 3.4 g/dL
Glucose: 84 mg/dL (ref 74–106)
Potassium reflex Magnesium: 3.1 mmol/L — ABNORMAL LOW (ref 3.4–5.1)
Sodium: 143 mmol/L (ref 136–145)
Total Bilirubin: 1.8 mg/dL — ABNORMAL HIGH (ref 0.3–1.2)
Total Protein: 7.3 g/dL (ref 6.4–8.3)

## 2020-03-13 LAB — LIPASE: Lipase: 29 U/L (ref 5.6–51.3)

## 2020-03-13 LAB — MICROSCOPIC URINALYSIS

## 2020-03-13 LAB — PREGNANCY, URINE: HCG(Urine) Pregnancy Test: NEGATIVE

## 2020-03-13 LAB — COVID-19, RAPID: SARS-CoV-2, NAAT: NOT DETECTED

## 2020-03-13 LAB — MAGNESIUM: Magnesium: 1.7 mg/dL (ref 1.7–2.4)

## 2020-03-13 MED ORDER — IPRATROPIUM-ALBUTEROL 0.5-2.5 (3) MG/3ML IN SOLN
RESPIRATORY_TRACT | Status: DC | PRN
Start: 2020-03-13 — End: 2020-03-15

## 2020-03-13 MED ORDER — ONDANSETRON 4 MG PO TBDP
4 MG | Freq: Three times a day (TID) | ORAL | Status: DC | PRN
Start: 2020-03-13 — End: 2020-03-15

## 2020-03-13 MED ORDER — OXYCODONE HCL 5 MG PO TABS
5 MG | ORAL | Status: DC | PRN
Start: 2020-03-13 — End: 2020-03-15
  Administered 2020-03-13 – 2020-03-15 (×4): 5 mg via ORAL

## 2020-03-13 MED ORDER — CLONIDINE HCL 0.1 MG PO TABS
0.1 MG | Freq: Two times a day (BID) | ORAL | Status: DC
Start: 2020-03-13 — End: 2020-03-13

## 2020-03-13 MED ORDER — CALCIUM 500 MG PO TABS
500 MG | Freq: Every day | ORAL | Status: DC
Start: 2020-03-13 — End: 2020-03-14

## 2020-03-13 MED ORDER — FERROUS GLUCONATE 324 (37.5 FE) MG PO TABS
324 (37.5 Fe) MG | Freq: Every day | ORAL | Status: DC
Start: 2020-03-13 — End: 2020-03-14
  Administered 2020-03-14: 12:00:00 324 mg via ORAL

## 2020-03-13 MED ORDER — INFUVITE ADULT IV INJ
INTRAVENOUS | Status: DC
Start: 2020-03-13 — End: 2020-03-14

## 2020-03-13 MED ORDER — TRAZODONE HCL 50 MG PO TABS
50 MG | Freq: Every evening | ORAL | Status: DC
Start: 2020-03-13 — End: 2020-03-15
  Administered 2020-03-14 – 2020-03-15 (×2): 100 mg via ORAL

## 2020-03-13 MED ORDER — ESCITALOPRAM OXALATE 10 MG PO TABS
10 MG | Freq: Every day | ORAL | Status: DC
Start: 2020-03-13 — End: 2020-03-15
  Administered 2020-03-13 – 2020-03-15 (×3): 10 mg via ORAL

## 2020-03-13 MED ORDER — FOLIC ACID 5 MG/ML IJ SOLN
5 MG/ML | INTRAMUSCULAR | Status: DC
Start: 2020-03-13 — End: 2020-03-14

## 2020-03-13 MED ORDER — SODIUM CHLORIDE 0.9 % IV SOLN
0.9 | INTRAVENOUS | Status: DC
Start: 2020-03-13 — End: 2020-03-14

## 2020-03-13 MED ORDER — POTASSIUM CHLORIDE CRYS ER 20 MEQ PO TBCR
20 MEQ | Freq: Three times a day (TID) | ORAL | Status: DC
Start: 2020-03-13 — End: 2020-03-15
  Administered 2020-03-13 – 2020-03-15 (×6): 20 meq via ORAL

## 2020-03-13 MED ORDER — LORAZEPAM 2 MG/ML IJ SOLN
2 MG/ML | Freq: Once | INTRAMUSCULAR | Status: AC
Start: 2020-03-13 — End: 2020-03-13
  Administered 2020-03-13: 18:00:00 1 mg via INTRAVENOUS

## 2020-03-13 MED ORDER — CYCLOBENZAPRINE HCL 10 MG PO TABS
10 MG | Freq: Three times a day (TID) | ORAL | Status: DC | PRN
Start: 2020-03-13 — End: 2020-03-15
  Administered 2020-03-13 – 2020-03-15 (×3): 5 mg via ORAL

## 2020-03-13 MED ORDER — PANTOPRAZOLE SODIUM 40 MG PO TBEC
40 MG | Freq: Every day | ORAL | Status: DC
Start: 2020-03-13 — End: 2020-03-15
  Administered 2020-03-14 – 2020-03-15 (×2): 40 mg via ORAL

## 2020-03-13 MED ORDER — HYDROXYZINE PAMOATE 25 MG PO CAPS
25 MG | Freq: Three times a day (TID) | ORAL | Status: DC | PRN
Start: 2020-03-13 — End: 2020-03-15
  Administered 2020-03-14 – 2020-03-15 (×2): 25 mg via ORAL

## 2020-03-13 MED ORDER — POLYETHYLENE GLYCOL 3350 17 G PO PACK
17 g | Freq: Every day | ORAL | Status: DC | PRN
Start: 2020-03-13 — End: 2020-03-15

## 2020-03-13 MED ORDER — SODIUM CHLORIDE 0.9 % IV BOLUS
0.9 % | Freq: Once | INTRAVENOUS | Status: AC
Start: 2020-03-13 — End: 2020-03-13
  Administered 2020-03-13: 18:00:00 1000 mL via INTRAVENOUS

## 2020-03-13 MED ORDER — MAGNESIUM OXIDE 400 (240 MG) MG PO TABS
400 (240 Mg) MG | Freq: Every day | ORAL | Status: DC
Start: 2020-03-13 — End: 2020-03-15
  Administered 2020-03-13 – 2020-03-15 (×3): 400 mg via ORAL

## 2020-03-13 MED ORDER — DILTIAZEM HCL ER COATED BEADS 180 MG PO CP24
180 MG | Freq: Every day | ORAL | Status: DC
Start: 2020-03-13 — End: 2020-03-13

## 2020-03-13 MED ORDER — THIAMINE HCL 100 MG/ML IJ SOLN
100 MG/ML | Freq: Once | INTRAMUSCULAR | Status: AC
Start: 2020-03-13 — End: 2020-03-13
  Administered 2020-03-13: 22:00:00 via INTRAVENOUS

## 2020-03-13 MED ORDER — NICOTINE 14 MG/24HR TD PT24
14 MG/24HR | Freq: Every day | TRANSDERMAL | Status: DC
Start: 2020-03-13 — End: 2020-03-15

## 2020-03-13 MED ORDER — DEXTROSE 5 % IV SOLN (MINI-BAG)
5 % | INTRAVENOUS | Status: DC
Start: 2020-03-13 — End: 2020-03-15
  Administered 2020-03-14 – 2020-03-15 (×2): 1000 mg via INTRAVENOUS

## 2020-03-13 MED ORDER — LEVOTHYROXINE SODIUM 125 MCG PO TABS
125 MCG | Freq: Every day | ORAL | Status: DC
Start: 2020-03-13 — End: 2020-03-15
  Administered 2020-03-13 – 2020-03-15 (×3): 125 ug via ORAL

## 2020-03-13 MED ORDER — ENOXAPARIN SODIUM 40 MG/0.4ML SC SOLN
40 | Freq: Every day | SUBCUTANEOUS | Status: DC
Start: 2020-03-13 — End: 2020-03-15
  Administered 2020-03-13 – 2020-03-15 (×3): 40 mg via SUBCUTANEOUS

## 2020-03-13 MED ORDER — DEXTROSE 5 % IV SOLN (MINI-BAG)
5 % | Freq: Once | INTRAVENOUS | Status: AC
Start: 2020-03-13 — End: 2020-03-13
  Administered 2020-03-13: 18:00:00 1000 mg via INTRAVENOUS

## 2020-03-13 MED ORDER — METOPROLOL SUCCINATE ER 100 MG PO TB24
100 MG | Freq: Every day | ORAL | Status: DC
Start: 2020-03-13 — End: 2020-03-14
  Administered 2020-03-13: 22:00:00 100 mg via ORAL

## 2020-03-13 MED ORDER — CLONAZEPAM 0.5 MG PO TABS
0.5 MG | Freq: Three times a day (TID) | ORAL | Status: DC | PRN
Start: 2020-03-13 — End: 2020-03-15
  Administered 2020-03-13 – 2020-03-15 (×4): 0.5 mg via ORAL

## 2020-03-13 MED ORDER — THIAMINE HCL 100 MG/ML IJ SOLN
100 MG/ML | INTRAMUSCULAR | Status: DC
Start: 2020-03-13 — End: 2020-03-14

## 2020-03-13 MED ORDER — ONDANSETRON HCL 4 MG/2ML IJ SOLN
4 MG/2ML | Freq: Four times a day (QID) | INTRAMUSCULAR | Status: DC | PRN
Start: 2020-03-13 — End: 2020-03-15

## 2020-03-13 MED ORDER — LISINOPRIL 10 MG PO TABS
10 MG | Freq: Every day | ORAL | Status: DC
Start: 2020-03-13 — End: 2020-03-15
  Administered 2020-03-13: 22:00:00 40 mg via ORAL

## 2020-03-13 MED FILL — POTASSIUM CHLORIDE CRYS ER 20 MEQ PO TBCR: 20 meq | ORAL | Qty: 1

## 2020-03-13 MED FILL — LORAZEPAM 2 MG/ML IJ SOLN: 2 mg/mL | INTRAMUSCULAR | Qty: 1

## 2020-03-13 MED FILL — CYCLOBENZAPRINE HCL 10 MG PO TABS: 10 mg | ORAL | Qty: 1

## 2020-03-13 MED FILL — LISINOPRIL 10 MG PO TABS: 10 mg | ORAL | Qty: 4

## 2020-03-13 MED FILL — LEVOTHYROXINE SODIUM 125 MCG PO TABS: 125 ug | ORAL | Qty: 1

## 2020-03-13 MED FILL — RA CALCIUM HI-CAL 500 MG PO TABS: 500 mg | ORAL | Qty: 1

## 2020-03-13 MED FILL — CEFTRIAXONE SODIUM 1 G IJ SOLR: 1 g | INTRAMUSCULAR | Qty: 1000

## 2020-03-13 MED FILL — METOPROLOL SUCCINATE ER 100 MG PO TB24: 100 mg | ORAL | Qty: 1

## 2020-03-13 MED FILL — NICOTINE 14 MG/24HR TD PT24: 14 mg/(24.h) | TRANSDERMAL | Qty: 1

## 2020-03-13 MED FILL — THIAMINE HCL 100 MG/ML IJ SOLN: 100 mg/mL | INTRAMUSCULAR | Qty: 2

## 2020-03-13 MED FILL — MAGNESIUM OXIDE -MG SUPPLEMENT 400 (240 MG) MG PO TABS: 400 (240 Mg) MG | ORAL | Qty: 1

## 2020-03-13 MED FILL — FOLIC ACID 5 MG/ML IJ SOLN: 5 mg/mL | INTRAMUSCULAR | Qty: 10

## 2020-03-13 MED FILL — ESCITALOPRAM OXALATE 10 MG PO TABS: 10 mg | ORAL | Qty: 1

## 2020-03-13 MED FILL — CLONAZEPAM 0.5 MG PO TABS: 0.5 mg | ORAL | Qty: 1

## 2020-03-13 MED FILL — FOLIC ACID 5 MG/ML IJ SOLN: 5 mg/mL | INTRAMUSCULAR | Qty: 0.2

## 2020-03-13 MED FILL — OXYCODONE HCL 5 MG PO TABS: 5 mg | ORAL | Qty: 1

## 2020-03-13 MED FILL — INFUVITE ADULT IV INJ: INTRAVENOUS | Qty: 10

## 2020-03-13 MED FILL — ENOXAPARIN SODIUM 40 MG/0.4ML SC SOLN: 40 MG/0.4ML | SUBCUTANEOUS | Qty: 0.4

## 2020-03-13 MED FILL — DILTIAZEM HCL ER COATED BEADS 180 MG PO CP24: 180 mg | ORAL | Qty: 2

## 2020-03-13 NOTE — ED Notes (Signed)
Kristin Munoz per MD request- Dr. Si Raider on phone with [name] about admission- patient accepted at this time     Cerenity Goshorn, RN  03/13/20 1356

## 2020-03-13 NOTE — ED Notes (Signed)
Report to saylor, rn     Consuela Mimes, RN  03/13/20 1450

## 2020-03-13 NOTE — Progress Notes (Addendum)
I have reviewed the discharge summary from [facility] ([id number]) and I have reviewed the patient's medication fill list from [facility].  Attempted to talk with pt but she is unable to go over home medications at this time due to current condition.    Added:  -Fluticasone 33mcg/act nasal spray-2 sprays in each nostril daily-this was deleted off of home med list prior to my review.  Recenty filled at pharmacy and is on the dc list from [facility].  -Tamsulosin 0.4mg  po daily: This med has not been filled recently at local pharmacy but was on [facility] DC list from 03/06/20    Deleted:   -Cardizem CD 360mg  24hr cap-Take 1 cap daily-stopped by [facility] but was recently filled at pharmacy (03/06/2020)  -Clonidine 0.1mg  tablet-Take 1 tab po bid-Stopped by [facility] but was recently filled at pharmacy (03/06/2020)  -Omeprazole 40mg  capsule-Take 1 cap po qd-stopped by [facility] but was recently filled by pharmacy (03/06/2020)      Changes:   -Ondansetron 4mg  tablet-directions clarified to 4mg  q8h prn per pharmacy  -Lexapro 10mg  tablet-frequency changed from qd to BID based on discharge summary from [facility].  Called pharmacy to clarify and original Rx for QD was written 09/2019 for a 6 month supply.     Other notes:  -Lisinopril 40mg  tablet is has been filled by pharmacy and is waiting for pick up.  Last fill was 11/04/2019 for a 30DS   -Amlodipine was not on [facility] DC list but pt filled recently on 03/06/20 and it was on PCP list.  Left medication on home med list.  -Oxybutynin was not on [facility] DC list but pt filled it recently on 02/18/20.  Left medication on home med list.  -Oscal was on PCP med list, however it was not on [facility] DC list and it has not been filled recently.  Removed from home med list.  -Clonazepam was on [facility] DC list, however per fill history and [facility] pt has not filled it recently.  The last BZD the pt filled was Diazepam on 02/18/20 for a 1 day supply.  -Potassium citrate was last filled as 10/2019 po BID on 02/13/20, however on [facility] DC list the dose is 01/02/2020 po  TID.  Home med list currently reflects [facility] DC list.    05/06/20, CPhT

## 2020-03-13 NOTE — Progress Notes (Signed)
Pt transported to medical unit by Tamera Punt RN at this time

## 2020-03-13 NOTE — ED Triage Notes (Signed)
Pt arrives with jailer from [employer].  Pt was brought tp [facility] today for [facility] but was refused because of pts stated medical history.  He brings her here for clearance for jail.    Pt states she just got out of [facility] a few days ago.  Pt states she has surgery scheduled tomorrow and two for next week.

## 2020-03-13 NOTE — ED Provider Notes (Signed)
[facility]  eMERGENCY dEPARTMENT eNCOUnter      Pt Name: Kristin Munoz  MRN: [id number]  [place]: [birth year]  Date ofevaluation: 03/13/2020  Provider: Abelardo Diesel, DO    CHIEF COMPLAINT       Chief Complaint   Patient presents with   ??? Other     medical clearance for jail         HISTORY OF PRESENT ILLNESS  (Location/Symptom, Timing/Onset, Context/Setting, Quality, Duration, Modifying Factors, Severity.)   Kristin Munoz is a 39 y.o. female who presents to the emergency department with complaint of multiple medical conditions.  Apparently patient was apprehended and taken to the jail about 2 hours ago and there she told them that she has several medical, comorbidities including liver and kidney failure.She was sent to the emergency department for medical clearance. Patient denies any fever, chills, nausea vomiting and diarrhea stools.  Patient admits to urinary frequency with urgency and dysuria.  Patient is accompanied by a jail guard.      Nursing notes were reviewed.    REVIEW OF SYSTEMS    (2-9systems for level 4, 10 or more for level 5)   ROS:  General:  No fevers, no chills, no weakness  HEENT: No sore throat, runny nose or ear pain  Cardiovascular:  No chest pain, no palpitations  Respiratory:  No shortness of breath, no cough, no wheezing  Gastrointestinal:  No pain, no nausea, no vomiting, no diarrhea  Musculoskeletal:  No muscle pain, no joint pain  Skin:  No rash, no easy bruising  Genitourinary:  No dysuria, no hematuria  Psych: Patient denies any suicidal or homicidal ideation.    Except as noted above theremainder of the review of systems was reviewed and negative.       PASTMEDICAL HISTORY     Past Medical History:   Diagnosis Date   ??? Anemia    ??? Arthritis    ??? Asthma    ??? Atrial fibrillation ([place])    ??? CAD (coronary artery disease)    ??? Cancer (HCC)     ovarian, bone, breast and colon   ??? Cardiac arrest [facility])     patient states that her heart stopped and she had to be revived by her  daughter   ??? Cerebral artery occlusion with cerebral infarction [facility])    ??? CHF (congestive heart failure) ([place])    ??? Chronic kidney disease    ??? COPD (chronic obstructive pulmonary disease) ([place])    ??? Depression    ??? Diabetes mellitus ([place])     diet control   ??? Headache    ??? Hypertension    ??? Hypokalemia    ??? Kidney stone    ??? Liver failure ([place])    ??? Myocardial infarction ([place]) 06/2016   ??? Pulmonary embolism ([place])    ??? Self-catheterizes urinary bladder    ??? Thyroid disease          SURGICAL HISTORY       Past Surgical History:   Procedure Laterality Date   ??? APPENDECTOMY     ??? BACK SURGERY     ??? BREAST LUMPECTOMY Left    ??? BREAST SURGERY     ??? CHOLECYSTECTOMY     ??? HEMORRHOID SURGERY     ??? HIP SURGERY     ??? HYSTERECTOMY     ??? KIDNEY STONE SURGERY      x 72 per patient   ??? KNEE SURGERY Left    ???  LAPAROSCOPY      x36 per patient   ??? LUNG SURGERY      for pulmonary embolisms         CURRENT MEDICATIONS       Current Discharge Medication List      CONTINUE these medications which have NOT CHANGED    Details   phenazopyridine (PYRIDIUM) 100 MG tablet Take one to two tablets every 4 hours as needed      oxybutynin (DITROPAN XL) 10 MG extended release tablet Take 10 mg by mouth daily Indications: Overactive Bladder       oxyCODONE HCl (OXY-IR) 10 MG immediate release tablet Take 10 mg by mouth every 8 hours as needed (for moderate pain).       potassium citrate (UROCIT-K) 10 MEQ (1080 MG) extended release tablet Take 10 mEq by mouth 3 times daily (with meals)      pantoprazole (PROTONIX) 40 MG tablet Take 40 mg by mouth daily       fluticasone (FLONASE) 50 MCG/ACT nasal spray 2 sprays by Each Nostril route daily as needed Shake before using       amLODIPine (NORVASC) 5 MG tablet TAKE 1 TABLET BY MOUTH DAILY FOR BLOOD PRESSURE  Qty: 30 tablet, Refills: 0      metoprolol succinate (TOPROL XL) 100 MG extended release tablet TAKE 1 TABLET BY MOUTH DAILY  Qty: 30 tablet, Refills: 0      PROAIR HFA 108 (90 Base) MCG/ACT inhaler  INHALE 2 PUFFS BY MOUTH EVERY 4 HOURS AS NEEDED FOR WHEEZING  Qty: 8.5 g, Refills: 0    Comments: Needs appt for further refills      promethazine (PHENERGAN) 25 MG tablet Take 1 tablet by mouth every 8 hours as needed for Nausea  Qty: 60 tablet, Refills: 5      ferrous gluconate (FERGON) 324 (38 Fe) MG tablet TAKE 1 TABLET BY MOUTH DAILY WITH BREAKFAST  Qty: 30 tablet, Refills: 5      lisinopril (PRINIVIL;ZESTRIL) 40 MG tablet TAKE 1 TABLET BY MOUTH DAILY  Qty: 30 tablet, Refills: 5      SUMAtriptan (IMITREX) 100 MG tablet TAKE 1 TABLET BY MOUTH 1 TIME AS NEEDED FOR MIGRAINE  Qty: 9 tablet, Refills: 5      potassium chloride (KLOR-CON M) 20 MEQ extended release tablet TAKE 2 TABLETS BY MOUTH THREE TIMES DAILY  Qty: 180 tablet, Refills: 5      MAGNESIUM-OXIDE 400 (241.3 Mg) MG TABS tablet TAKE 1 TABLET BY MOUTH TWICE DAILY  Qty: 60 tablet, Refills: 5      levothyroxine (SYNTHROID) 125 MCG tablet TAKE 1 TABLET BY MOUTH DAILY  Qty: 30 tablet, Refills: 5      traZODone (DESYREL) 100 MG tablet TAKE 1 TABLET BY MOUTH EVERY NIGHT  Qty: 30 tablet, Refills: 5      escitalopram (LEXAPRO) 10 MG tablet TAKE 1 TABLET BY MOUTH DAILY  Qty: 30 tablet, Refills: 5      ondansetron (ZOFRAN) 4 MG tablet TAKE 1 TABLET BY MOUTH THREE TIMES DAILY AS NEEDED FOR NAUSEA OR VOMITING  Qty: 30 tablet, Refills: 2      cyclobenzaprine (FLEXERIL) 10 MG tablet TAKE 1 TABLET BY MOUTH THREE TIMES DAILY AS NEEDED FOR MUSCLE SPASMS  Qty: 90 tablet, Refills: 5      hydrOXYzine (VISTARIL) 25 MG capsule TAKE 1 CAPSULE BY MOUTH THREE TIMES DAILY AS NEEDED FOR ANXIETY  Qty: 90 capsule, Refills: 5      clonazePAM (KLONOPIN)  0.5 MG tablet Take 0.5 mg by mouth 3 times daily as needed.       EPINEPHrine (EPIPEN) 0.3 MG/0.3ML SOAJ injection Inject 0.3 mLs into the muscle as needed (allergic reaction) Use as directed for allergic reaction  Qty: 1 each, Refills: 2      nitroGLYCERIN (NITROSTAT) 0.4 MG SL tablet TAKE 1 TABLET UNDER THE TONGUE AS NEEDED FOR CHEST  PAIN EVERY 5 MINUTES. MAX OF 3 DOSES. IF NO RELIEF AFTER 1 DOSE CALL 911  Qty: 25 tablet, Refills: 2      Blood Glucose Monitoring Suppl (ONE TOUCH ULTRA 2) w/Device KIT 1 kit by Does not apply route daily  Qty: 1 kit, Refills: 0      blood glucose test strips (ASCENSIA AUTODISC VI;ONE TOUCH ULTRA TEST VI) strip 1 each by In Vitro route daily QD and PRN Dx:DM2  Qty: 50 each, Refills: 5             ALLERGIES     Macadamia nut oil, Naproxen, Pcn [penicillins], Peanut-containing drug products, Sweet potato, Tizanidine, Toradol [ketorolac tromethamine], Zanaflex [tizanidine hcl], Desmopressin acetate, Macrobid [nitrofurantoin], Pertussis vaccines, Polio virus vaccine live oral trivalent, Tetanus toxoids, Levaquin [levofloxacin in d5w], and Levofloxacin    FAMILY HISTORY     History reviewed. No pertinent family history.       SOCIAL HISTORY       Social History     Socioeconomic History   ??? Marital status: Legally Separated     Spouse name: None   ??? Number of children: None   ??? Years of education: None   ??? Highest education level: None   Occupational History   ??? None   Tobacco Use   ??? Smoking status: Never Smoker   ??? Smokeless tobacco: Never Used   Vaping Use   ??? Vaping Use: Never used   Substance and Sexual Activity   ??? Alcohol use: No   ??? Drug use: Yes     Types: Marijuana, Methamphetamines   ??? Sexual activity: None   Other Topics Concern   ??? None   Social History Narrative   ??? None     Social Determinants of Health     Financial Resource Strain:    ??? Difficulty of Paying Living Expenses:    Food Insecurity:    ??? Worried About Charity fundraiser in the Last Year:    ??? Arboriculturist in the Last Year:    Transportation Needs:    ??? Film/video editor (Medical):    ??? Lack of Transportation (Non-Medical):    Physical Activity:    ??? Days of Exercise per Week:    ??? Minutes of Exercise per Session:    Stress:    ??? Feeling of Stress :    Social Connections:    ??? Frequency of Communication with Friends and Family:    ???  Frequency of Social Gatherings with Friends and Family:    ??? Attends Religious Services:    ??? Marine scientist or Organizations:    ??? Attends Music therapist:    ??? Marital Status:    Intimate Production manager Violence:    ??? Fear of Current or Ex-Partner:    ??? Emotionally Abused:    ??? Physically Abused:    ??? Sexually Abused:          PHYSICAL EXAM    (up to 7 forlevel 4, 8 or more for level 5)  ED Triage Vitals   BP Temp Temp src Pulse Resp SpO2 Height Weight   -- -- -- -- -- -- -- --       Physical Exam  General :Patient is awake, alert, oriented, in no acute distress, nontoxic appearing  HEENT: Pupils are equally round and reactive to light, EOMI.   Cardiac: Heart regular rate, rhythm, no murmurs, rubs, or gallops  Lungs: Lungs are clear to auscultation, there is no wheezing, rhonchi, or rales.   Abdomen:Abdomen is soft, nontender, nondistended with mild palpatory tenderness in her suprapubic area.  Musculoskeletal: Ambulatory  Back: No midline or bony tenderness.   Dermatology: Skin is warm and dry  Psych: Mentation is grossly normal, cognition is grossly normal. Affect is anxious.Patient continues to be moving and twitching because of possible methamphetamine use.      DIAGNOSTIC RESULTS       RADIOLOGY:   Non-plain film images such as CT, Ultrasoundand MRI are read by the radiologist. Plain radiographic images are visualized and preliminarily interpreted by the emergency physician with the below findings:      '[]'  Radiologist's Report Reviewed:  XR ABDOMEN (2 VIEWS)   Final Result   Impression: No acute abnormality.            ED BEDSIDE ULTRASOUND:   Performed by ED Physician - none    LABS:  Labs Reviewed   CBC WITH AUTO DIFFERENTIAL - Abnormal; Notable for the following components:       Result Value    Hemoglobin 10.5 (*)     Hematocrit 34.4 (*)     MCV 79.1 (*)     MCH 24.1 (*)     MCHC 30.5 (*)     RDW 22.2 (*)     MPV 11.0 (*)     All other components within normal limits    Narrative:      Performed at:  [place] and [facility]  [address]  [place] [address]   Phone [phone]   COMPREHENSIVE METABOLIC PANEL W/ REFLEX TO MG FOR LOW K - Abnormal; Notable for the following components:    Potassium reflex Magnesium 3.1 (*)     Total Bilirubin 1.8 (*)     Alkaline Phosphatase 160 (*)     ALT 66 (*)     AST 43 (*)     All other components within normal limits    Narrative:     Performed at:  [place] and [facility]  [address],  [place] [address]   Phone [phone]   URINALYSIS - Abnormal; Notable for the following components:    Clarity, UA CLOUDY (*)     Bilirubin Urine MODERATE (*)     Ketones, Urine TRACE (*)     Blood, Urine TRACE-LYSED (*)     Protein, UA 100 (*)     Leukocyte Esterase, Urine SMALL (*)     All other components within normal limits    Narrative:     Performed at:  [place] and [facility]  [address]  [place] [address]   Phone [phone]   DRUG SCREEN MULTI URINE - Abnormal; Notable for the following components:    Benzodiazepine Screen, Urine POSITIVE (*)     Amphetamine Screen, Urine POSITIVE (*)     Cannabinoid Scrn, Ur POSITIVE (*)     Tricyclic POSITIVE (*)  Methamphetamine, Urine POSITIVE (*)     UR Oxycodone Rapid Screen POSITIVE (*)     All other components within normal limits    Narrative:     Performed at:  [place] and [facility]  [address],  [place] [address]   Phone [phone]   MICROSCOPIC URINALYSIS - Abnormal; Notable for the following components:    WBC, UA 51-100 (*)     RBC, UA 5-10 (*)     Bacteria, UA Rare (*)     Crystals, UA Few Ca. Oxalate (*)     All other components within normal limits    Narrative:     Performed at:  [place] [facility]  [address]  [place] [address]   Phone [phone]   COMPREHENSIVE METABOLIC PANEL W/  REFLEX TO MG FOR LOW K - Abnormal; Notable for the following components:    Chloride 111 (*)     Glucose 108 (*)     Total Protein 6.1 (*)     Albumin 3.0 (*)     Alkaline Phosphatase 131 (*)     ALT 46 (*)     All other components within normal limits    Narrative:     Performed at:  [place] and [facility]  [address]  [place] [address]   Phone [phone]   CBC - Abnormal; Notable for the following components:    Hemoglobin 9.7 (*)     Hematocrit 33.3 (*)     MCH 23.8 (*)     MCHC 29.1 (*)     RDW 22.1 (*)     MPV 10.4 (*)     All other components within normal limits    Narrative:     Performed at:  [place] and [facility] Laboratory  [address]  [place] [address]   Phone [phone]   PROTIME-INR - Abnormal; Notable for the following components:    Protime 13.9 (*)     INR 1.12 (*)     All other components within normal limits    Narrative:     Performed at:  [place] and [facility]  [address]  [place] [address]   Phone [phone]   COVID-19, RAPID    Narrative:     ORDER WAS CANCELLED 03/13/2020 14:30, TEST DONE IN HOUSE.  Performed at:  [place] and [facility]  [address],  [place] [address]   Phone [phone]   LIPASE    Narrative:     Performed at:  [place] and [facility] Laboratory  [address]  [place] [address]   Phone [phone]   PREGNANCY, URINE    Narrative:     Performed at:  [place] and [facility] Laboratory  [address],  [place] [address]   Phone [phone]   MAGNESIUM    Narrative:     Performed at:  [place] and [facility] Laboratory  [address],  [place] [address]   Phone [phone]   ETHANOL    Narrative:     Performed at:  [place] and [facility] Laboratory  [address]  [place]  [address]  Phone [phone]   MAGNESIUM    Narrative:     Performed at:  [place] and [facility] Laboratory  [address]  [place] [address]   Phone [phone]       I have reviewed and interpreted all of the currently available lab resultsfrom this visit (if applicable):  Results for orders placed or performed during the hospital encounter of 03/13/20   COVID-19, Rapid   Result Value Ref Range    SARS-CoV-2, NAAT Not Detected Not Detected   Lipase   Result Value Ref Range    Lipase 29.0 5.6 - 51.3 U/L   CBC Auto Differential   Result Value Ref Range    WBC 9.4 4.0 - 11.0 K/uL    RBC 4.35 3.80 - 5.80 M/uL    Hemoglobin 10.5 (L) 11.5 - 16.5 g/dL    Hematocrit 34.4 (L) 37.0 - 47.0 %    MCV 79.1 (L) 80.0 - 100.0 fL    MCH 24.1 (L) 27.0 - 32.0 pg    MCHC 30.5 (L) 31.0 - 35.0 g/dL    RDW 22.2 (H) 11.0 - 16.0 %    Platelets 313 150 - 400 K/uL    MPV 11.0 (H) 6.0 - 10.0 fL    Neutrophils % 55.3 %    Immature Granulocytes % 0.3 0.0 - 5.0 %    Lymphocytes % 33.6 %    Monocytes % 8.4 %    Eosinophils % 1.7 %    Basophils % 0.7 %    Neutrophils Absolute 5.2 2.0 - 7.5 K/uL    Immature Granulocytes # 0.0 K/uL    Lymphocytes Absolute 3.2 1.5 - 4.0 K/uL    Monocytes Absolute 0.8 0.2 - 0.8 K/uL    Eosinophils Absolute 0.2 0.0 - 0.4 K/uL    Basophils Absolute 0.1 0.0 - 0.1 K/uL   Comprehensive Metabolic Panel w/ Reflex to MG   Result Value Ref Range    Sodium 143 136 - 145 mmol/L    Potassium reflex Magnesium 3.1 (L) 3.4 - 5.1 mmol/L    Chloride 105 98 - 107 mmol/L    CO2 23 20 - 30 mmol/L    Anion Gap 15 3 - 16    Glucose 84 74 - 106 mg/dL    BUN 11 6 - 20 mg/dL    CREATININE 0.8 0.4 - 1.2 mg/dL    GFR Non-African American >60 >59    GFR African American >59 >59    Calcium 9.3 8.5 - 10.5 mg/dL    Total Protein 7.3 6.4 - 8.3 g/dL    Albumin 3.9 3.4 - 4.8 g/dL    Albumin/Globulin Ratio 1.1 0.8 - 2.0    Total Bilirubin 1.8 (H) 0.3 - 1.2 mg/dL    Alkaline Phosphatase 160 (H) 25 - 100 U/L    ALT 66 (H)  4 - 36 U/L    AST 43 (H) 8 - 33 U/L    Globulin 3.4 g/dL   Urinalysis, reflex to microscopic   Result Value Ref Range    Color, UA Dark yellow Straw/Yellow    Clarity, UA CLOUDY (A) Clear    Glucose, Ur Negative Negative mg/dL    Bilirubin Urine MODERATE (A) Negative    Ketones, Urine TRACE (A) Negative mg/dL    Specific Gravity, UA >=1.030 1.005 - 1.030    Blood, Urine TRACE-LYSED (A) Negative    pH, UA 6.0 5.0 - 8.0    Protein, UA  100 (A) Negative mg/dL    Urobilinogen, Urine 1.0 <2.0 E.U./dL    Nitrite, Urine Negative Negative    Leukocyte Esterase, Urine SMALL (A) Negative    Microscopic Examination YES     Urine Type Catheter    Pregnancy, Urine   Result Value Ref Range    HCG(Urine) Pregnancy Test Negative Detects HCG level >20 MIU/mL   Drug Screen, Multiple, Urine   Result Value Ref Range    PCP Screen, Urine Neg Negative <25 ng/mL    Benzodiazepine Screen, Urine POSITIVE (A) Negative <300 ng/mL    Cocaine Metabolite Screen, Urine Neg Negative <300 ng/mL    Amphetamine Screen, Urine POSITIVE (A) Negative <1000 ng/mL    Cannabinoid Scrn, Ur POSITIVE (A) Negative <50 ng/mL    Opiate Scrn, Ur Neg Negative <300 ng/mL    Barbiturate Screen, Ur Neg Negative <937 ng/mL    Tricyclic POSITIVE (A) Negative <300 ng/mL    Methadone Screen, Urine Neg Negative <300 ng/ml    Propoxyphene Screen, Urine Neg Negative <300 ng/mL    Methamphetamine, Urine POSITIVE (A) Negative <1000 ng/mL    UR Oxycodone Rapid Screen POSITIVE (A) Negative <100 ng/mL    Buprenorphine Qual, Urine Neg Negative <25 ng/mL    Drug Screen Comment: see below    Magnesium   Result Value Ref Range    Magnesium 1.7 1.7 - 2.4 mg/dL   Microscopic Urinalysis   Result Value Ref Range    WBC, UA 51-100 (A) 0 - 5 /HPF    RBC, UA 5-10 (A) 0 - 4 /HPF    Epithelial Cells, UA 0-1 0 - 5 /HPF    Bacteria, UA Rare (A) None Seen /HPF    Amorphous, UA 1+ /HPF    Crystals, UA Few Ca. Oxalate (A) None Seen /HPF   Comprehensive Metabolic Panel w/ Reflex to MG   Result Value  Ref Range    Sodium 144 136 - 145 mmol/L    Potassium reflex Magnesium 3.4 3.4 - 5.1 mmol/L    Chloride 111 (H) 98 - 107 mmol/L    CO2 24 20 - 30 mmol/L    Anion Gap 9 3 - 16    Glucose 108 (H) 74 - 106 mg/dL    BUN 12 6 - 20 mg/dL    CREATININE 0.9 0.4 - 1.2 mg/dL    GFR Non-African American >60 >59    GFR African American >59 >59    Calcium 8.6 8.5 - 10.5 mg/dL    Total Protein 6.1 (L) 6.4 - 8.3 g/dL    Albumin 3.0 (L) 3.4 - 4.8 g/dL    Albumin/Globulin Ratio 1.0 0.8 - 2.0    Total Bilirubin 1.0 0.3 - 1.2 mg/dL    Alkaline Phosphatase 131 (H) 25 - 100 U/L    ALT 46 (H) 4 - 36 U/L    AST 30 8 - 33 U/L    Globulin 3.1 g/dL   CBC   Result Value Ref Range    WBC 5.6 4.0 - 11.0 K/uL    RBC 4.08 3.80 - 5.80 M/uL    Hemoglobin 9.7 (L) 11.5 - 16.5 g/dL    Hematocrit 33.3 (L) 37.0 - 47.0 %    MCV 81.6 80.0 - 100.0 fL    MCH 23.8 (L) 27.0 - 32.0 pg    MCHC 29.1 (L) 31.0 - 35.0 g/dL    RDW 22.1 (H) 11.0 - 16.0 %    Platelets 268 150 - 400 K/uL  MPV 10.4 (H) 6.0 - 10.0 fL   Protime-INR   Result Value Ref Range    Protime 13.9 (H) 11.6 - 13.8 sec    INR 1.12 (H) 0.88 - 1.11   Ethanol   Result Value Ref Range    Ethanol Lvl None Detected mg/dL   Magnesium   Result Value Ref Range    Magnesium 1.9 1.7 - 2.4 mg/dL        All other labs were within normal range or not returned as of this dictation.    EMERGENCY DEPARTMENT COURSE and DIFFERENTIAL DIAGNOSIS/MDM:   Vitals:    Vitals:    03/13/20 1728 03/13/20 2027 03/14/20 0011 03/14/20 0443   BP: 126/87 116/76 (!) 94/57 (!) 98/56   Pulse: 78 80 61 56   Resp: '18 16 18 16   ' Temp: 97.4 ??F (36.3 ??C) 97.9 ??F (36.6 ??C) 97.1 ??F (36.2 ??C) 97.5 ??F (36.4 ??C)   TempSrc: Oral Temporal Temporal Temporal   SpO2: 99% 97% 98% 93%   Weight:       Height:           Patient remained stable while in the emergency department.    The patient will follow-up with their PCP in 1-2 days for reevaluation.  If the patient or family members have any further concerns or any worsening symptoms they will return  to the ED for reevaluation.         CONSULTS:  None    PROCEDURES:  Procedures    CRITICAL CARE TIME    Total Critical Care time was 0 minutes, excluding separately reportable procedures.   There was a high probability of clinically significant/life threatening deterioration in the patient's condition which required my urgent intervention.      FINAL IMPRESSION      1. Pyelonephritis Stable   2. Urinary tract infection without hematuria, site unspecified Stable   3. Multiple substance abuse ([place]) Stable         DISPOSITION/PLAN   DISPOSITION Admitted 03/13/2020 02:07:24 PM      PATIENT REFERRED TO:  No follow-up provider specified.    DISCHARGE MEDICATIONS:  Current Discharge Medication List          Comment: Please note this report has been produced using speech recognition software and may contain errors related tothat system including errors in grammar, punctuation, and spelling, as well as words and phrases that may be inappropriate. If there are any questions or concerns please feel free to contact the dictating provider forclarification.    Abelardo Diesel, DO  Attending Emergency Physician                  Abelardo Diesel, DO  03/14/20 [phone]

## 2020-03-14 LAB — CBC
Hematocrit: 33.3 % — ABNORMAL LOW (ref 37.0–47.0)
Hemoglobin: 9.7 g/dL — ABNORMAL LOW (ref 11.5–16.5)
MCH: 23.8 pg — ABNORMAL LOW (ref 27.0–32.0)
MCHC: 29.1 g/dL — ABNORMAL LOW (ref 31.0–35.0)
MCV: 81.6 fL (ref 80.0–100.0)
MPV: 10.4 fL — ABNORMAL HIGH (ref 6.0–10.0)
Platelets: 268 10*3/uL (ref 150–400)
RBC: 4.08 M/uL (ref 3.80–5.80)
RDW: 22.1 % — ABNORMAL HIGH (ref 11.0–16.0)
WBC: 5.6 10*3/uL (ref 4.0–11.0)

## 2020-03-14 LAB — COMPREHENSIVE METABOLIC PANEL W/ REFLEX TO MG FOR LOW K
ALT: 46 U/L — ABNORMAL HIGH (ref 4–36)
AST: 30 U/L (ref 8–33)
Albumin/Globulin Ratio: 1 (ref 0.8–2.0)
Albumin: 3 g/dL — ABNORMAL LOW (ref 3.4–4.8)
Alkaline Phosphatase: 131 U/L — ABNORMAL HIGH (ref 25–100)
Anion Gap: 9 (ref 3–16)
BUN: 12 mg/dL (ref 6–20)
CO2: 24 mmol/L (ref 20–30)
Calcium: 8.6 mg/dL (ref 8.5–10.5)
Chloride: 111 mmol/L — ABNORMAL HIGH (ref 98–107)
Creatinine: 0.9 mg/dL (ref 0.4–1.2)
GFR African American: >59 (ref >59)
GFR Non-African American: >60 (ref >59)
Globulin: 3.1 g/dL
Glucose: 108 mg/dL — ABNORMAL HIGH (ref 74–106)
Potassium reflex Magnesium: 3.4 mmol/L (ref 3.4–5.1)
Sodium: 144 mmol/L (ref 136–145)
Total Bilirubin: 1 mg/dL (ref 0.3–1.2)
Total Protein: 6.1 g/dL — ABNORMAL LOW (ref 6.4–8.3)

## 2020-03-14 LAB — FOLATE: Folate: 18.19 ng/mL (ref >5.4)

## 2020-03-14 LAB — MAGNESIUM: Magnesium: 1.9 mg/dL (ref 1.7–2.4)

## 2020-03-14 LAB — PROTIME-INR
INR: 1.12 — ABNORMAL HIGH (ref 0.88–1.11)
Protime: 13.9 s — ABNORMAL HIGH (ref 11.6–13.8)

## 2020-03-14 LAB — IRON AND TIBC
Iron Saturation: 13 % — ABNORMAL LOW (ref 15–50)
Iron: 41 ug/dL (ref 37–145)
TIBC: 320 ug/dL (ref 250–450)

## 2020-03-14 LAB — FERRITIN: Ferritin: 124 ng/mL (ref 22.0–322.0)

## 2020-03-14 LAB — TSH: TSH: 0.45 u[IU]/mL (ref 0.27–4.20)

## 2020-03-14 LAB — VITAMIN B12: Vitamin B-12: 797 pg/mL (ref 211–911)

## 2020-03-14 LAB — ETHANOL: Ethanol Lvl: NOT DETECTED mg/dL

## 2020-03-14 MED ORDER — METOPROLOL SUCCINATE ER 50 MG PO TB24
50 MG | Freq: Every day | ORAL | Status: DC
Start: 2020-03-14 — End: 2020-03-15
  Administered 2020-03-15: 13:00:00 50 mg via ORAL

## 2020-03-14 MED ORDER — POTASSIUM CITRATE ER 10 MEQ (1080 MG) PO TBCR
10 MEQ (80 MG) | Freq: Three times a day (TID) | ORAL | Status: DC
Start: 2020-03-14 — End: 2020-03-15

## 2020-03-14 MED ORDER — FLUTICASONE PROPIONATE 50 MCG/ACT NA SUSP
50 MCG/ACT | Freq: Every day | NASAL | Status: DC
Start: 2020-03-14 — End: 2020-03-15

## 2020-03-14 MED ORDER — TAMSULOSIN HCL 0.4 MG PO CAPS
0.4 MG | Freq: Every day | ORAL | Status: DC
Start: 2020-03-14 — End: 2020-03-15

## 2020-03-14 MED ORDER — OYSTER SHELL CALCIUM 500 MG PO TABS
500 MG | Freq: Every day | ORAL | Status: DC
Start: 2020-03-14 — End: 2020-03-14

## 2020-03-14 MED ORDER — SODIUM CHLORIDE 0.9 % IV SOLN
0.9 % | INTRAVENOUS | Status: AC
Start: 2020-03-14 — End: 2020-03-15
  Administered 2020-03-14: 15:00:00 via INTRAVENOUS

## 2020-03-14 MED ORDER — SUCRALFATE 1 G PO TABS
1 GM | Freq: Four times a day (QID) | ORAL | Status: DC
Start: 2020-03-14 — End: 2020-03-15
  Administered 2020-03-14 – 2020-03-15 (×4): 1 g via ORAL

## 2020-03-14 MED FILL — POTASSIUM CHLORIDE CRYS ER 20 MEQ PO TBCR: 20 meq | ORAL | Qty: 1

## 2020-03-14 MED FILL — PANTOPRAZOLE SODIUM 40 MG PO TBEC: 40 mg | ORAL | Qty: 1

## 2020-03-14 MED FILL — FERROUS GLUCONATE 324 (37.5 FE) MG PO TABS: 324 (37.5 Fe) MG | ORAL | Qty: 1

## 2020-03-14 MED FILL — POTASSIUM CITRATE ER 10 MEQ (1080 MG) PO TBCR: 10 MEQ (80 MG) | ORAL | Qty: 1

## 2020-03-14 MED FILL — MAGNESIUM OXIDE -MG SUPPLEMENT 400 (240 MG) MG PO TABS: 400 (240 Mg) MG | ORAL | Qty: 1

## 2020-03-14 MED FILL — CYCLOBENZAPRINE HCL 10 MG PO TABS: 10 mg | ORAL | Qty: 1

## 2020-03-14 MED FILL — CLONAZEPAM 0.5 MG PO TABS: 0.5 mg | ORAL | Qty: 1

## 2020-03-14 MED FILL — ESCITALOPRAM OXALATE 10 MG PO TABS: 10 mg | ORAL | Qty: 1

## 2020-03-14 MED FILL — LISINOPRIL 10 MG PO TABS: 10 mg | ORAL | Qty: 4

## 2020-03-14 MED FILL — NICOTINE 14 MG/24HR TD PT24: 14 mg/(24.h) | TRANSDERMAL | Qty: 1

## 2020-03-14 MED FILL — LEVOTHYROXINE SODIUM 125 MCG PO TABS: 125 ug | ORAL | Qty: 1

## 2020-03-14 MED FILL — SUCRALFATE 1 G PO TABS: 1 g | ORAL | Qty: 1

## 2020-03-14 MED FILL — METOPROLOL SUCCINATE ER 100 MG PO TB24: 100 mg | ORAL | Qty: 1

## 2020-03-14 MED FILL — CEFTRIAXONE SODIUM 1 G IJ SOLR: 1 g | INTRAMUSCULAR | Qty: 1000

## 2020-03-14 MED FILL — FLUTICASONE PROPIONATE 50 MCG/ACT NA SUSP: 50 MCG/ACT | NASAL | Qty: 16

## 2020-03-14 MED FILL — HYDROXYZINE PAMOATE 25 MG PO CAPS: 25 mg | ORAL | Qty: 1

## 2020-03-14 MED FILL — ENOXAPARIN SODIUM 40 MG/0.4ML SC SOLN: 40 MG/0.4ML | SUBCUTANEOUS | Qty: 0.4

## 2020-03-14 MED FILL — OXYCODONE HCL 5 MG PO TABS: 5 mg | ORAL | Qty: 1

## 2020-03-14 MED FILL — TRAZODONE HCL 50 MG PO TABS: 50 mg | ORAL | Qty: 2

## 2020-03-14 NOTE — Progress Notes (Signed)
Patient stated she hasn't voided since yesterday. Harlow Asa PA aware and stated to scan bladder. Patients bladder scanned 256 ml. Dr. Fonnie Birkenhead in to see the patient. Orders received to insert foley cath. #16 foley cath inserted one attempt and 300 ml of amber colored urine followed. Patient tolerated well. Will continue to monitor.

## 2020-03-14 NOTE — Progress Notes (Signed)
Spoke to Dr. Marlan Palau office to request the last 2 progress notes, imaging results, and any surgical notes, per P.A. Fredia Sorrow' request. Was told that this process may take months, but if there were any imaging results they could be sent immediatly.

## 2020-03-14 NOTE — Care Coordination-Inpatient (Signed)
Peer support specialist consulted and met with pt. Peer support specialist is trying to get pt into [facility] rehab facility. Pt agreeable and intake at that facility working on case.
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
## 2020-03-14 NOTE — Plan of Care (Signed)
Problem: GU  Goal: Adequate urinary output  03/13/2020 1730 by Chancy Milroy, RN  Outcome: Ongoing  Goal: No urinary complication  [id number] by Chancy Milroy, RN  Outcome: Ongoing     Problem: Daily Care:  Goal: Daily care needs are met  Description: Daily care needs are met  Outcome: Ongoing     Problem: Discharge Planning:  Goal: Patients continuum of care needs are met  Description: Patients continuum of care needs are met  Outcome: Ongoing

## 2020-03-14 NOTE — H&P (Signed)
History and Physical    Patient:  Kristin Munoz    CHIEF COMPLAINT:    Abdominal pain, painful urination, and difficulty urinating    HISTORY OF PRESENT ILLNESS:   The patient is a 39 y.o. female with complicated PMH that reportedly includes anemia, asthma, atrial fibrillation, CAD, ovarian,bone,breast,colon cancers, CHF, HTN, PE, urinary retention, nephrolithiasis, hypothyroidism, drug abuse, tobacco abuse who presents due to multiple complaints including urinary frequency, pain with urination, and abdominal pain. Patient states she is homeless and she lives in her car. States she was sitting at her father's grave and got arrested for public intoxication. She reports she "took 1 hit of marijuana and fell to the ground." When they were taking her to jail she felt really sick to her stomach and told them she was too sick to go to jail and had to go to the hospital. States she has been having difficulty urinating and it burns. She has history of retention and has to self cath at times. Has not had any supplies due to homelessness and states she can't pee much on her own. She denies methamphetamine use stating she only smokes pot because it was prescribed to her for chronic pain last year when she lived in [place]. She has chronic pain because she's had so many types of cancer and arthritis. States she has titanium rods all over her body. She states she has had over 200 surgeries for kidney stones and sees Dr. Bennie Pierini. States she has a surgery with him next week but can't say what the surgery is for. Reports severe chronic kidney disease stage 4. BUN 12/Cr 0.9 today.  No fever, sweats, chills. Continues to have abdominal pain. States she jsut got out of [facility] for liver and kidney failure.     Per chart review patient was hospitalized at [facility] from 6/2-6/10 for acute liver failure, acute hepatitis secondary to drug/Macrobid induced liver injury, upper GI bleed.  She was discharged home to follow-up  with GI and PCP as scheduled.  CT abdomen/pelvis 6/2 without evidence nephrolithiasis or hydronephrosis. She had not seen GI prior to presentation here. Reviewed UDS March and June 2021 from [facility] and both positive for methamphetamine although patient denies meth use.     Past Medical History:      Diagnosis Date   ??? Anemia    ??? Arthritis    ??? Asthma    ??? Atrial fibrillation ([place])    ??? CAD (coronary artery disease)    ??? Cancer (HCC)     ovarian, bone, breast and colon   ??? Cardiac arrest [facility])     patient states that her heart stopped and she had to be revived by her daughter   ??? Cerebral artery occlusion with cerebral infarction [facility])    ??? CHF (congestive heart failure) ([place])    ??? Chronic kidney disease    ??? COPD (chronic obstructive pulmonary disease) ([place])    ??? Depression    ??? Diabetes mellitus ([place])     diet control   ??? Headache    ??? Hypertension    ??? Hypokalemia    ??? Kidney stone    ??? Liver failure ([place])    ??? Myocardial infarction ([place]) 06/2016   ??? Pulmonary embolism ([place])    ??? Self-catheterizes urinary bladder    ??? Thyroid disease        Past Surgical History:      Procedure Laterality Date   ??? APPENDECTOMY     ???  BACK SURGERY     ??? BREAST LUMPECTOMY Left    ??? BREAST SURGERY     ??? CHOLECYSTECTOMY     ??? HEMORRHOID SURGERY     ??? HIP SURGERY     ??? HYSTERECTOMY     ??? KIDNEY STONE SURGERY      x 72 per patient   ??? KNEE SURGERY Left    ??? LAPAROSCOPY      x36 per patient   ??? LUNG SURGERY      for pulmonary embolisms       Medications Prior to Admission:    Prior to Admission medications    Medication Sig Start Date End Date Taking? Authorizing Provider   phenazopyridine (PYRIDIUM) 100 MG tablet Take one to two tablets every 4 hours as needed   Yes Historical Provider, MD   oxybutynin (DITROPAN XL) 10 MG extended release tablet Take 10 mg by mouth daily Indications: Overactive Bladder    Yes Historical Provider, MD   oxyCODONE HCl (OXY-IR) 10 MG immediate release tablet Take 10 mg by mouth every 8 hours as needed (for  moderate pain).  03/06/20 03/20/20 Yes Historical Provider, MD   potassium citrate (UROCIT-K) 10 MEQ (1080 MG) extended release tablet Take 10 mEq by mouth 3 times daily (with meals)   Yes Historical Provider, MD   pantoprazole (PROTONIX) 40 MG tablet Take 40 mg by mouth daily  03/06/20  Yes Shandy Karrick, DO   fluticasone (FLONASE) 50 MCG/ACT nasal spray 2 sprays by Each Nostril route daily as needed Shake before using    Yes Historical Provider, MD   amLODIPine (NORVASC) 5 MG tablet TAKE 1 TABLET BY MOUTH DAILY FOR BLOOD PRESSURE 02/26/20  Yes Broadus John, APRN   metoprolol succinate (TOPROL XL) 100 MG extended release tablet TAKE 1 TABLET BY MOUTH DAILY 02/26/20  Yes Broadus John, APRN   PROAIR HFA 108 [phone] Base) MCG/ACT inhaler INHALE 2 PUFFS BY MOUTH EVERY 4 HOURS AS NEEDED FOR WHEEZING 02/26/20  Yes Broadus John, APRN   promethazine (PHENERGAN) 25 MG tablet Take 1 tablet by mouth every 8 hours as needed for Nausea 01/21/20  Yes Broadus John, APRN   ferrous gluconate (FERGON) 324 (38 Fe) MG tablet TAKE 1 TABLET BY MOUTH DAILY WITH BREAKFAST 01/14/20  Yes Broadus John, APRN   lisinopril (PRINIVIL;ZESTRIL) 40 MG tablet TAKE 1 TABLET BY MOUTH DAILY 01/14/20  Yes Broadus John, APRN   SUMAtriptan (IMITREX) 100 MG tablet TAKE 1 TABLET BY MOUTH 1 TIME AS NEEDED FOR MIGRAINE 01/02/20  Yes Broadus John, APRN   potassium chloride (KLOR-CON M) 20 MEQ extended release tablet TAKE 2 TABLETS BY MOUTH THREE TIMES DAILY 12/03/19  Yes Broadus John, APRN   MAGNESIUM-OXIDE 400 (241.3 Mg) MG TABS tablet TAKE 1 TABLET BY MOUTH TWICE DAILY 11/02/19  Yes Broadus John, APRN   levothyroxine (SYNTHROID) 125 MCG tablet TAKE 1 TABLET BY MOUTH DAILY 11/01/19  Yes Broadus John, APRN   traZODone (DESYREL) 100 MG tablet TAKE 1 TABLET BY MOUTH EVERY NIGHT 11/01/19  Yes Broadus John, APRN   escitalopram (LEXAPRO) 10 MG tablet TAKE 1 TABLET BY MOUTH DAILY  Patient taking differently: Take 10 mg by mouth 2 times daily  10/03/19  Yes Broadus John, APRN   ondansetron (ZOFRAN) 4 MG tablet TAKE 1 TABLET BY MOUTH THREE TIMES DAILY AS NEEDED FOR NAUSEA OR VOMITING  Patient taking differently: Take 4 mg by mouth  every 8 hours as needed for Nausea  10/03/19  Yes Broadus John, APRN   cyclobenzaprine (FLEXERIL) 10 MG tablet TAKE 1 TABLET BY MOUTH THREE TIMES DAILY AS NEEDED FOR MUSCLE SPASMS 10/03/19  Yes Broadus John, APRN   hydrOXYzine (VISTARIL) 25 MG capsule TAKE 1 CAPSULE BY MOUTH THREE TIMES DAILY AS NEEDED FOR ANXIETY 08/06/19  Yes Broadus John, APRN   clonazePAM (KLONOPIN) 0.5 MG tablet Take 0.5 mg by mouth 3 times daily as needed.    Yes Historical Provider, MD   EPINEPHrine (EPIPEN) 0.3 MG/0.3ML SOAJ injection Inject 0.3 mLs into the muscle as needed (allergic reaction) Use as directed for allergic reaction 01/21/20   Broadus John, APRN   nitroGLYCERIN (NITROSTAT) 0.4 MG SL tablet TAKE 1 TABLET UNDER THE TONGUE AS NEEDED FOR CHEST PAIN EVERY 5 MINUTES. MAX OF 3 DOSES. IF NO RELIEF AFTER 1 DOSE CALL 911 10/03/19   Broadus John, APRN   Blood Glucose Monitoring Suppl (ONE TOUCH ULTRA 2) w/Device KIT 1 kit by Does not apply route daily 02/28/19   Broadus John, APRN   blood glucose test strips (ASCENSIA AUTODISC VI;ONE TOUCH ULTRA TEST VI) strip 1 each by In Vitro route daily QD and PRN Dx:DM2 02/28/19   Broadus John, APRN       Allergies:  Macadamia nut oil, Naproxen, Pcn [penicillins], Peanut-containing drug products, Sweet potato, Tizanidine, Toradol [ketorolac tromethamine], Zanaflex [tizanidine hcl], Desmopressin acetate, Macrobid [nitrofurantoin], Pertussis vaccines, Polio virus vaccine live oral trivalent, Tetanus toxoids, Levaquin [levofloxacin in d5w], and Levofloxacin    Social History:   TOBACCO:   reports that she has never smoked. She has never used smokeless tobacco.  ETOH:   reports no history of alcohol use.  OCCUPATION:  None     Family History:   History reviewed. No pertinent family history.    Review of system  Constitutional:  Denies  fever or chills   Eyes:  Denies eye pain or redness  HENT:  Denies nasal congestion or sore throat   Respiratory:  Denies cough or shortness of breath   Cardiovascular:  Denies chest pain or edema   GI:  Denies nausea, vomiting, bloody stools or diarrhea . Positive for abdominal pain.   GU:  Positive for dysuria , retention, frequency  Musculoskeletal:  Denies acute neck pain or body aches. Positive for chronic arthralgias  Integument:  Denies rash or itching. Positive for multiple scabbed lesions to arms and legs.   Neurologic:  Denies headache, dizziness, numbness, tingling or unilateral weakness  Psychiatric:  Denies acute depression or acute anxiety      Vital Signs  Temp: 98.4 ??F (36.9 ??C)  Pulse: 64  Resp: 16  BP: 105/67  SpO2: 98 %  O2 Device: None (Room air)       vital signs reviewed in electronic chart.    Physical exam  Constitutional:  Well developed, poor hygiene, no acute distress, restless   Eyes:  PERRL, no scleral icterus, conjunctiva normal   HENT:  Atraumatic, external ears normal, nose normal, oropharynx moist, no pharyngeal exudates. Neck- supple, no JVD, no lymphadenopathy  Respiratory:  No respiratory distress on room air, no wheezing, rales or rhonchi detected  Cardiovascular:  Normal rate, normal rhythm, no murmurs, no gallops, no rubs, no edema   GI:  Soft, nondistended, normal bowel sounds, diffuse ttp, no voluntary guarding  Musculoskeletal:  No cyanosis or obvious acute deformity. Moving all extremities   Integument:  Warm and  dry. Scabbed lesions to face, arms, legs  Neurologic:  Alert & oriented x 3, no apparent focal deficits noted   Psychiatric:  Speech and behavior appropriate         Lab Results   Component Value Date    NA 144 03/14/2020    K 3.4 03/14/2020    CL 111 (H) 03/14/2020    CO2 24 03/14/2020    BUN 12 03/14/2020    CREATININE 0.9 03/14/2020    GLUCOSE 108 (H) 03/14/2020    CALCIUM 8.6 03/14/2020    PROT 6.1 (L) 03/14/2020    LABALBU 3.0 (L) 03/14/2020    BILITOT 1.0  03/14/2020    ALKPHOS 131 (H) 03/14/2020    AST 30 03/14/2020    ALT 46 (H) 03/14/2020    LABGLOM >60 03/14/2020    GFRAA >59 03/14/2020    AGRATIO 1.0 03/14/2020    GLOB 3.1 03/14/2020           Lab Results   Component Value Date    WBC 5.6 03/14/2020    HGB 9.7 (L) 03/14/2020    HCT 33.3 (L) 03/14/2020    MCV 81.6 03/14/2020    PLT 268 03/14/2020     XR ABDOMEN (2 VIEWS)   Final Result   Impression: No acute abnormality.        Lab Results   Component Value Date    ALKPHOS 131 03/14/2020    ALT 46 03/14/2020    AST 30 03/14/2020    PROT 6.1 03/14/2020    BILITOT 1.0 03/14/2020    LABALBU 3.0 03/14/2020         Assessment and Plan     Active Hospital Problems    Diagnosis Date Noted   ??? Polysubstance abuse ([place]) [F19.10]  - UDS + benzos, amphetamine, cannabinoid, oxycodone, methamphetamine, tricyclic  - patient denies methamphetamine use although drug screens march and June 2021 at outside facility also +meth  - peer support specialist consulted   - encourage cessation  03/14/2020   ??? Hypokalemia [E87.6]  - continue home potassium supplementation   - monitor and replace as needed  03/14/2020   ??? Urinary retention [R33.9]  - patient reports longstanding history of urinary retention and intermittently self caths at home when she has supplies - currently homeless and no supplies? Unclear why she has issue with retention  - restart flomax home medication if bp tolerates   - foley catheter placed and case manager consulted to assist with obtaining cath supplies as outpatient  - follows with dr Bennie Pierini as outpatient  03/14/2020   ??? History of GI bleed [Z87.19]  - EGD at outside facility with multiple ulcerations no acute bleeding   - continue protonix and add carafate   - monitor  03/14/2020   ??? History of liver failure [Z87.19]  - recently admitted at [facility] with AST 4300, ALT 2600, bilirubin 3, INR 1.3. MRCP negative for obstruction  - liver biopsy highly suspicious for drug induced liver injury secondary to  macrobid and also chronic hepatitis C   - ALT 46, AST 30 - continue to monitor  03/14/2020   ??? Anemia [D64.9]  - Hgb 9.7   - per chart review she recently had hematemesis and required blood transfusion at outside hospital  - EGD with multiple ulcerations without evidnce of acute bleeding  - started on ppi  - add carafate  - also history of iron deficiency. Continue po iron supplement. Repeat iron studies   -  monitor and transfuse for hgb < 6 03/14/2020   ??? Chronic hepatitis C without hepatic coma ([place]) [B18.2]  - per [facility] dc summary  - f/u with gi as scheduled 03/14/2020   ??? History of nephrolithiasis [Z87.442]  - patient reports longstanding history of nephrolithiasis and follows with Dr. Bennie Pierini  - recent ct abd/pelv at [facility] without evidence of nephrolithiasis   - f/u with [facility] as scheduled 03/14/2020   ??? Anxiety and depression [F41.9, F32.9]  - continue current regimen   - encourage drug cessation  03/14/2020   ??? Benign essential HTN [I10]  - Home meds include  lisinopril 40 mg, metoprolol succinate XL 100 mg daily  - BP low and lisinopril placed on hold. Metoprolol dose decreased. Monitor.   - hydrate with ivfs  03/14/2020   ??? History of cancer [Z85.9]  - per patient report she had colon cancer at age 47 and underwent right colectomy followed by chemo. Also reports history of breast, bone, and uterine cancer. Attempted to obtain records regarding this from out of state facility in [place] where patient reports receiving treatment.  03/14/2020   ??? Hypothyroidism [E03.9]  - continue synthroid  - check tsh  03/14/2020   ??? UTI (urinary tract infection) [N39.0]  - UA indicative of UTI  - started on IV Rocephin  - follow urine culture   - foley catheter placed due to history of chronic retention and UTI  - patient reports self cath at home  - f/u with urology as scheduled as outpatient  03/13/2020     Patient was seen and examined by Dr. Delice Lesch and plan of care reviewed.    Kimber Relic, PA certifies per CMS  regulation for 42 CFR 424.15(a), that the patient may reasonably be expected to be discharged or transferred to a hospital within 96 hours after admission to [facility]    Electronically signed by Kimber Relic, PA on 03/14/2020 at 5:06 PM

## 2020-03-14 NOTE — ACP (Advance Care Planning) (Signed)
Advance Care Planning     General Advance Care Planning (ACP) Conversation    Date of Conversation: 03/13/2020  Conducted with: Patient with Decision Making Capacity    Healthcare Decision Maker:    Koleen Distance (daughter) [phone]    Click here to complete Diplomatic Services operational officer including selection of the Healthcare Decision Maker Relationship (ie "Primary")  Today we documented Decision Maker(s) consistent with Legal Next of Kin hierarchy.    Content/Action Overview:  Has NO ACP documents/care preferences - information provided, considering goals and options  Reviewed DNR/DNI and patient elects Full Code (Attempt Resuscitation)    Length of Voluntary ACP Conversation in minutes:  <16 minutes (Non-Billable)    Tamsen Snider, RN

## 2020-03-15 LAB — COMPREHENSIVE METABOLIC PANEL
ALT: 37 U/L — ABNORMAL HIGH (ref 4–36)
AST: 23 U/L (ref 8–33)
Albumin/Globulin Ratio: 1.1 (ref 0.8–2.0)
Albumin: 3 g/dL — ABNORMAL LOW (ref 3.4–4.8)
Alkaline Phosphatase: 118 U/L — ABNORMAL HIGH (ref 25–100)
Anion Gap: 8 (ref 3–16)
BUN: 9 mg/dL (ref 6–20)
CO2: 21 mmol/L (ref 20–30)
Calcium: 8.3 mg/dL — ABNORMAL LOW (ref 8.5–10.5)
Chloride: 112 mmol/L — ABNORMAL HIGH (ref 98–107)
Creatinine: 0.6 mg/dL (ref 0.4–1.2)
GFR African American: >59 (ref >59)
GFR Non-African American: >60 (ref >59)
Globulin: 2.7 g/dL
Glucose: 122 mg/dL — ABNORMAL HIGH (ref 74–106)
Potassium: 3.3 mmol/L — ABNORMAL LOW (ref 3.4–5.1)
Sodium: 141 mmol/L (ref 136–145)
Total Bilirubin: 0.7 mg/dL (ref 0.3–1.2)
Total Protein: 5.7 g/dL — ABNORMAL LOW (ref 6.4–8.3)

## 2020-03-15 LAB — CBC
Hematocrit: 31.5 % — ABNORMAL LOW (ref 37.0–47.0)
Hemoglobin: 9.1 g/dL — ABNORMAL LOW (ref 11.5–16.5)
MCH: 23.8 pg — ABNORMAL LOW (ref 27.0–32.0)
MCHC: 28.9 g/dL — ABNORMAL LOW (ref 31.0–35.0)
MCV: 82.5 fL (ref 80.0–100.0)
MPV: 11.2 fL — ABNORMAL HIGH (ref 6.0–10.0)
Platelets: 227 10*3/uL (ref 150–400)
RBC: 3.82 M/uL (ref 3.80–5.80)
RDW: 21.8 % — ABNORMAL HIGH (ref 11.0–16.0)
WBC: 4.7 10*3/uL (ref 4.0–11.0)

## 2020-03-15 MED ORDER — NYSTATIN 100000 UNIT/ML MT SUSP
100000 UNIT/ML | Freq: Four times a day (QID) | OROMUCOSAL | Status: DC
Start: 2020-03-15 — End: 2020-03-15
  Administered 2020-03-15: 16:00:00 500000 mL via ORAL

## 2020-03-15 MED ORDER — LEVOTHYROXINE SODIUM 125 MCG PO TABS
125 MCG | ORAL_TABLET | Freq: Every day | ORAL | 0 refills | Status: AC
Start: 2020-03-15 — End: ?

## 2020-03-15 MED ORDER — ESCITALOPRAM OXALATE 10 MG PO TABS
10 MG | ORAL_TABLET | Freq: Every day | ORAL | 0 refills | Status: DC
Start: 2020-03-15 — End: 2020-04-02

## 2020-03-15 MED ORDER — TAMSULOSIN HCL 0.4 MG PO CAPS
0.4 MG | ORAL_CAPSULE | Freq: Every day | ORAL | 0 refills | Status: AC
Start: 2020-03-15 — End: ?

## 2020-03-15 MED ORDER — HYDROXYZINE PAMOATE 25 MG PO CAPS
25 MG | ORAL_CAPSULE | Freq: Two times a day (BID) | ORAL | 0 refills | Status: AC | PRN
Start: 2020-03-15 — End: 2020-04-14

## 2020-03-15 MED ORDER — TRAZODONE HCL 100 MG PO TABS
100 MG | ORAL_TABLET | Freq: Every evening | ORAL | 0 refills | Status: AC
Start: 2020-03-15 — End: ?

## 2020-03-15 MED ORDER — MAGNESIUM OXIDE 400 (241.3 MG) MG PO TABS
400 (241.3 Mg) MG | ORAL_TABLET | Freq: Two times a day (BID) | ORAL | 0 refills | Status: AC
Start: 2020-03-15 — End: 2020-04-14

## 2020-03-15 MED ORDER — SUCRALFATE 1 G PO TABS
1 GM | ORAL_TABLET | Freq: Four times a day (QID) | ORAL | 0 refills | Status: AC
Start: 2020-03-15 — End: 2020-03-29

## 2020-03-15 MED ORDER — FERROUS GLUCONATE 324 (38 FE) MG PO TABS
324 (38 Fe) MG | ORAL_TABLET | Freq: Two times a day (BID) | ORAL | 0 refills | Status: AC
Start: 2020-03-15 — End: ?

## 2020-03-15 MED ORDER — METOPROLOL SUCCINATE ER 50 MG PO TB24
50 MG | ORAL_TABLET | Freq: Every day | ORAL | 0 refills | Status: AC
Start: 2020-03-15 — End: ?

## 2020-03-15 MED ORDER — PANTOPRAZOLE SODIUM 40 MG PO TBEC
40 MG | ORAL_TABLET | Freq: Every day | ORAL | 0 refills | Status: AC
Start: 2020-03-15 — End: ?

## 2020-03-15 MED ORDER — NYSTATIN 100000 UNIT/ML MT SUSP
100000 UNIT/ML | Freq: Four times a day (QID) | OROMUCOSAL | 0 refills | Status: AC
Start: 2020-03-15 — End: 2020-03-25

## 2020-03-15 MED ORDER — CYCLOBENZAPRINE HCL 10 MG PO TABS
10 MG | ORAL_TABLET | Freq: Three times a day (TID) | ORAL | 0 refills | Status: AC | PRN
Start: 2020-03-15 — End: 2020-03-22

## 2020-03-15 MED ORDER — CEFDINIR 300 MG PO CAPS
300 MG | ORAL_CAPSULE | Freq: Two times a day (BID) | ORAL | 0 refills | Status: AC
Start: 2020-03-15 — End: 2020-03-20

## 2020-03-15 MED ORDER — FERROUS GLUCONATE 324 (37.5 FE) MG PO TABS
324 (37.5 Fe) MG | Freq: Two times a day (BID) | ORAL | Status: DC
Start: 2020-03-15 — End: 2020-03-15
  Administered 2020-03-15: 13:00:00 324 mg via ORAL

## 2020-03-15 MED FILL — NYSTATIN 100000 UNIT/ML MT SUSP: 100000 [IU]/mL | OROMUCOSAL | Qty: 5

## 2020-03-15 MED FILL — CEFTRIAXONE SODIUM 1 G IJ SOLR: 1 g | INTRAMUSCULAR | Qty: 1000

## 2020-03-15 MED FILL — HYDROXYZINE PAMOATE 25 MG PO CAPS: 25 mg | ORAL | Qty: 1

## 2020-03-15 MED FILL — CYCLOBENZAPRINE HCL 10 MG PO TABS: 10 mg | ORAL | Qty: 1

## 2020-03-15 MED FILL — POTASSIUM CHLORIDE CRYS ER 20 MEQ PO TBCR: 20 meq | ORAL | Qty: 1

## 2020-03-15 MED FILL — NICOTINE 14 MG/24HR TD PT24: 14 mg/(24.h) | TRANSDERMAL | Qty: 1

## 2020-03-15 MED FILL — LEVOTHYROXINE SODIUM 125 MCG PO TABS: 125 ug | ORAL | Qty: 1

## 2020-03-15 MED FILL — OXYCODONE HCL 5 MG PO TABS: 5 mg | ORAL | Qty: 1

## 2020-03-15 MED FILL — ESCITALOPRAM OXALATE 10 MG PO TABS: 10 mg | ORAL | Qty: 1

## 2020-03-15 MED FILL — SUCRALFATE 1 G PO TABS: 1 g | ORAL | Qty: 1

## 2020-03-15 MED FILL — CLONAZEPAM 0.5 MG PO TABS: 0.5 mg | ORAL | Qty: 1

## 2020-03-15 MED FILL — ENOXAPARIN SODIUM 40 MG/0.4ML SC SOLN: 40 MG/0.4ML | SUBCUTANEOUS | Qty: 0.4

## 2020-03-15 MED FILL — METOPROLOL SUCCINATE ER 50 MG PO TB24: 50 mg | ORAL | Qty: 1

## 2020-03-15 MED FILL — TRAZODONE HCL 50 MG PO TABS: 50 mg | ORAL | Qty: 2

## 2020-03-15 MED FILL — PANTOPRAZOLE SODIUM 40 MG PO TBEC: 40 mg | ORAL | Qty: 1

## 2020-03-15 MED FILL — MAGNESIUM OXIDE -MG SUPPLEMENT 400 (240 MG) MG PO TABS: 400 (240 Mg) MG | ORAL | Qty: 1

## 2020-03-15 MED FILL — FERROUS GLUCONATE 324 (37.5 FE) MG PO TABS: 324 (37.5 Fe) MG | ORAL | Qty: 1

## 2020-03-15 NOTE — Other (Signed)
03/15/20 0800   Assessment   Charting Type Shift assessment   Neurological   Level of Consciousness Alert (0)   Glasgow Coma Scale   Eye Opening 4   Best Verbal Response 5   Best Motor Response 6   Glasgow Coma Scale Score 15   HEENT   HEENT (WDL) X   Tongue Blistered;Other (Comment)  (white spots)   Voice Normal   Teeth Dentures upper;Dentures lower   Respiratory   Respiratory (WDL) WDL   Cardiac   Cardiac (WDL) WDL   Cardiac Monitor   Telemetry Monitor On No   Telemetry Audible Yes   Telemetry Alarms Set Yes   Gastrointestinal   Abdominal (WDL) WDL   RUQ Bowel Sounds Active   LUQ Bowel Sounds Active   RLQ Bowel Sounds Active   LLQ Bowel Sounds Active   Peripheral Vascular   Peripheral Vascular (WDL) WDL   Skin Color/Condition   Skin Color/Condition (WDL) X   Skin Color Pale   Skin Condition/Temp Warm;Dry   Skin Integrity   Skin Integrity (WDL) WDL   Musculoskeletal   Musculoskeletal (WDL) WDL   Genitourinary   Genitourinary (WDL) X  (has foley)   Urine Assessment   Incontinence No  (foley)   Urine Color Amber   Urine Appearance Clear   Urine Odor Malodorous   Urethral Catheter 16 fr   Placement Date/Time: 03/14/20 1000   Tube Size (fr): 16 fr  Catheter Balloon Size: 10 mL  Collection Container: Standard  Securement Method: Securing device (Describe)  Urine Returned: (c) Yes   Catheter Indications Need for fluid volume management of the critically ill patient in a critical care setting   Site Assessment Pink   Urine Color Amber   Urine Appearance Clear   Output (mL) 400 mL   Psychosocial   Psychosocial (WDL) X   Patient Behaviors Anxious   Needs Expressed Emotional

## 2020-03-15 NOTE — Progress Notes (Signed)
Pt stated this am that her ca was broken down and she was homeless.    I found 2 shelters that would take her but she refused both.   Later I asked her about [place] home and if she had a place to go to    She stated she has an apartment ( the one on her demographics)  She stated she has no ride, that her friend has her car.  And her apartment key was on that set of keys.   I said I thought your car was broken down she said no her friend has it.       I called her friend gerald  I got no answer   She called out on call light and stated she got ahold of his and that he was on his way here to get her

## 2020-03-15 NOTE — Discharge Instructions (Signed)
Disposition: home  Discharged Condition: Stable  Activity: activity as tolerated  Diet: regular diet  Follow Up:   Primary Care Provider Randolm Idol in 5-10 days.   Follow up with urologist Dr. Clayton Bibles in 3-5 days.   You need to call your primary care office and urology office on Monday 6/21 to schedule appointments since these offices are closed at time of discharge.   Follow up with GI Dr. Delice Lesch in 2-4 weeks (call [phone] to confirm your appointment time).   You were provided with self catheterization supplies for 3-4 days and you will need to call PCP or urology office to obtain more supplies this week.

## 2020-03-15 NOTE — Plan of Care (Signed)
Problem: Safety:  Goal: Free from accidental physical injury  Description: Free from accidental physical injury  Outcome: Ongoing     Problem: Daily Care:  Goal: Daily care needs are met  Description: Daily care needs are met  Outcome: Ongoing     Problem: Discharge Planning:  Goal: Patients continuum of care needs are met  Description: Patients continuum of care needs are met  Outcome: Ongoing

## 2020-03-15 NOTE — Progress Notes (Signed)
Orders for dc noted, iv dcd, supplies given for self cath, pt verbalizes understanding of dc instructions, Pt questioned when visitor out of room if she felt safe to leave with him and she stated "yes"  Home with roommate, pov, walked out by staff

## 2020-03-15 NOTE — Plan of Care (Signed)
Problem: GU  Goal: Adequate urinary output  Outcome: Met This Shift     Problem: Safety:  Goal: Free from accidental physical injury  Description: Free from accidental physical injury  03/15/2020 1023 by Dominga Ferry, RN  Outcome: Met This Shift  03/15/2020 0330 by Linda Hedges, RN  Outcome: Ongoing  Goal: Free from intentional harm  Description: Free from intentional harm  Outcome: Met This Shift     Problem: Pain:  Goal: Patient's pain/discomfort is manageable  Description: Patient's pain/discomfort is manageable  Outcome: Met This Shift     Problem: Falls - Risk of:  Goal: Will remain free from falls  Description: Will remain free from falls  Outcome: Met This Shift  Goal: Absence of physical injury  Description: Absence of physical injury  Outcome: Met This Shift     Problem: GU  Goal: No urinary complication  Outcome: Ongoing     Problem: Infection:  Goal: Will remain free from infection  Description: Will remain free from infection  Outcome: Ongoing     Problem: Daily Care:  Goal: Daily care needs are met  Description: Daily care needs are met  03/15/2020 1023 by Dominga Ferry, RN  Outcome: Ongoing  03/15/2020 0330 by Linda Hedges, RN  Outcome: Ongoing     Problem: Skin Integrity:  Goal: Skin integrity will stabilize  Description: Skin integrity will stabilize  Outcome: Ongoing     Problem: Discharge Planning:  Goal: Patients continuum of care needs are met  Description: Patients continuum of care needs are met  03/15/2020 1023 by Dominga Ferry, RN  Outcome: Ongoing  03/15/2020 0330 by Linda Hedges, RN  Outcome: Ongoing
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
## 2020-03-15 NOTE — Progress Notes (Signed)
Spoke to patient and confirmed dates and times for followups.

## 2020-03-19 MED ORDER — PROAIR HFA 108 (90 BASE) MCG/ACT IN AERS
108 (90 Base) MCG/ACT | RESPIRATORY_TRACT | 5 refills | Status: AC
Start: 2020-03-19 — End: ?

## 2020-03-21 ENCOUNTER — Encounter: Attending: Family | Primary: Family

## 2020-04-02 MED ORDER — ESCITALOPRAM OXALATE 10 MG PO TABS
10 MG | ORAL_TABLET | ORAL | 0 refills | Status: DC
Start: 2020-04-02 — End: 2020-05-06

## 2020-04-29 MED ORDER — CLONIDINE HCL 0.1 MG PO TABS
0.1 MG | ORAL_TABLET | ORAL | 2 refills | Status: AC
Start: 2020-04-29 — End: ?

## 2020-05-03 MED ORDER — OMEPRAZOLE 40 MG PO CPDR
40 MG | ORAL_CAPSULE | Freq: Every day | ORAL | 0 refills | Status: DC
Start: 2020-05-03 — End: 2020-06-30

## 2020-05-06 MED ORDER — DILTIAZEM HCL ER COATED BEADS 360 MG PO CP24
360 MG | ORAL_CAPSULE | ORAL | 0 refills | Status: DC
Start: 2020-05-06 — End: 2020-06-04

## 2020-05-06 MED ORDER — ESCITALOPRAM OXALATE 10 MG PO TABS
10 MG | ORAL_TABLET | ORAL | 0 refills | Status: DC
Start: 2020-05-06 — End: 2020-06-04

## 2020-05-20 ENCOUNTER — Inpatient Hospital Stay: Admit: 2020-05-20 | Payer: MEDICAID | Primary: Family

## 2020-05-20 ENCOUNTER — Encounter

## 2020-05-20 ENCOUNTER — Inpatient Hospital Stay: Payer: MEDICAID | Primary: Family

## 2020-05-20 DIAGNOSIS — M545 Low back pain, unspecified: Secondary | ICD-10-CM

## 2020-05-20 DIAGNOSIS — M546 Pain in thoracic spine: Secondary | ICD-10-CM

## 2020-06-05 MED ORDER — ESCITALOPRAM OXALATE 10 MG PO TABS
10 MG | ORAL_TABLET | ORAL | 0 refills | Status: AC
Start: 2020-06-05 — End: ?

## 2020-06-05 MED ORDER — POTASSIUM CHLORIDE CRYS ER 20 MEQ PO TBCR
20 MEQ | ORAL_TABLET | ORAL | 5 refills | Status: AC
Start: 2020-06-05 — End: ?

## 2020-06-05 MED ORDER — DILTIAZEM HCL ER COATED BEADS 360 MG PO CP24
360 MG | ORAL_CAPSULE | ORAL | 5 refills | Status: AC
Start: 2020-06-05 — End: ?

## 2020-06-09 NOTE — Telephone Encounter (Signed)
From: Okey Regal  To: Robin Searing, APRN  Sent: 06/08/2020 2:10 AM EDT  Subject: Visit Follow-Up Question    Can send me results if pathology report

## 2020-07-01 MED ORDER — OMEPRAZOLE 40 MG PO CPDR
40 MG | ORAL_CAPSULE | Freq: Every day | ORAL | 0 refills | Status: AC
Start: 2020-07-01 — End: ?

## 2020-08-22 ENCOUNTER — Emergency Department: Admit: 2020-08-22 | Payer: MEDICAID | Primary: Family

## 2020-08-22 ENCOUNTER — Inpatient Hospital Stay
Admit: 2020-08-22 | Discharge: 2020-08-22 | Disposition: A | Discharge status: Home / Self Care | Payer: MEDICAID | Attending: Emergency Medicine

## 2020-08-22 DIAGNOSIS — F1510 Other stimulant abuse, uncomplicated: Principal | ICD-10-CM

## 2020-08-22 LAB — COMPREHENSIVE METABOLIC PANEL W/ REFLEX TO MG FOR LOW K
ALT: 26 U/L (ref 4–36)
AST: 39 U/L — ABNORMAL HIGH (ref 8–33)
Albumin/Globulin Ratio: 1.2 (ref 0.8–2.0)
Albumin: 3.3 g/dL — ABNORMAL LOW (ref 3.4–4.8)
Alkaline Phosphatase: 146 U/L — ABNORMAL HIGH (ref 25–100)
Anion Gap: 11 (ref 3–16)
BUN: 5 mg/dL — ABNORMAL LOW (ref 6–20)
CO2: 25 mmol/L (ref 20–30)
Calcium: 8.7 mg/dL (ref 8.5–10.5)
Chloride: 105 mmol/L (ref 98–107)
Creatinine: 0.6 mg/dL (ref 0.4–1.2)
GFR African American: >59 (ref >59)
GFR Non-African American: >60 (ref >59)
Globulin: 2.7 g/dL
Glucose: 82 mg/dL (ref 74–106)
Potassium reflex Magnesium: 2.7 mmol/L — CL (ref 3.4–5.1)
Sodium: 141 mmol/L (ref 136–145)
Total Bilirubin: 0.4 mg/dL (ref 0.3–1.2)
Total Protein: 6 g/dL — ABNORMAL LOW (ref 6.4–8.3)

## 2020-08-22 LAB — URINALYSIS WITH REFLEX TO CULTURE
Bilirubin Urine: NEGATIVE
Blood, Urine: NEGATIVE
Glucose, Ur: NEGATIVE mg/dL
Ketones, Urine: NEGATIVE mg/dL
Leukocyte Esterase, Urine: NEGATIVE
Nitrite, Urine: NEGATIVE
Protein, UA: NEGATIVE mg/dL
Specific Gravity, UA: 1.02 (ref 1.005–1.030)
Urobilinogen, Urine: 0.2 E.U./dL (ref <2.0)
pH, UA: 7 (ref 5.0–8.0)

## 2020-08-22 LAB — CBC WITH AUTO DIFFERENTIAL
Basophils %: 0.5 %
Basophils Absolute: 0 10*3/uL (ref 0.0–0.1)
Eosinophils %: 3.6 %
Eosinophils Absolute: 0.1 10*3/uL (ref 0.0–0.4)
Hematocrit: 31.8 % — ABNORMAL LOW (ref 37.0–47.0)
Hemoglobin: 9.2 g/dL — ABNORMAL LOW (ref 11.5–16.5)
Immature Granulocytes #: 0 10*3/uL
Immature Granulocytes %: 0.3 % (ref 0.0–5.0)
Lymphocytes %: 33.5 %
Lymphocytes Absolute: 1.3 10*3/uL — ABNORMAL LOW (ref 1.5–4.0)
MCH: 21.7 pg — ABNORMAL LOW (ref 27.0–32.0)
MCHC: 28.9 g/dL — ABNORMAL LOW (ref 31.0–35.0)
MCV: 75 fL — ABNORMAL LOW (ref 80.0–100.0)
MPV: 10.6 fL — ABNORMAL HIGH (ref 6.0–10.0)
Monocytes %: 7.7 %
Monocytes Absolute: 0.3 10*3/uL (ref 0.2–0.8)
Neutrophils %: 54.4 %
Neutrophils Absolute: 2.1 10*3/uL (ref 2.0–7.5)
Platelets: 225 10*3/uL (ref 150–400)
RBC: 4.24 M/uL (ref 3.80–5.80)
RDW: 15.1 % (ref 11.0–16.0)
WBC: 3.9 10*3/uL — ABNORMAL LOW (ref 4.0–11.0)

## 2020-08-22 LAB — DRUG SCREEN MULTI URINE
Amphetamine Screen, Urine: POSITIVE — AB (ref <1000)
Barbiturate Screen, Ur: NEGATIVE (ref <200)
Benzodiazepine Screen, Urine: NEGATIVE (ref <300)
Buprenorphine Qual, Urine: POSITIVE — AB (ref <25)
Cannabinoid Scrn, Ur: POSITIVE — AB (ref <50)
Cocaine Metabolite Screen, Urine: NEGATIVE (ref <300)
Methadone Screen, Urine: NEGATIVE (ref <300)
Methamphetamine, Urine: POSITIVE — AB (ref <1000)
Opiate Scrn, Ur: NEGATIVE (ref <300)
PCP Screen, Urine: NEGATIVE (ref <25)
Propoxyphene Screen, Urine: NEGATIVE (ref <300)
Tricyclic: NEGATIVE (ref <300)
UR Oxycodone Rapid Screen: NEGATIVE (ref <100)

## 2020-08-22 LAB — LACTIC ACID: Lactic Acid: 1.8 mmol/L (ref 0.4–2.0)

## 2020-08-22 LAB — MAGNESIUM: Magnesium: 1.7 mg/dL (ref 1.7–2.4)

## 2020-08-22 LAB — PREGNANCY, URINE: HCG(Urine) Pregnancy Test: NEGATIVE

## 2020-08-22 LAB — TROPONIN: Troponin: <0.3 ng/mL (ref <0.30)

## 2020-08-22 MED ORDER — LACTATED RINGERS IV BOLUS
Freq: Once | INTRAVENOUS | Status: AC
Start: 2020-08-22 — End: 2020-08-22
  Administered 2020-08-22: 22:00:00 1000 mL via INTRAVENOUS

## 2020-08-22 MED ORDER — POTASSIUM CHLORIDE CRYS ER 20 MEQ PO TBCR
20 MEQ | Freq: Once | ORAL | Status: AC
Start: 2020-08-22 — End: 2020-08-22
  Administered 2020-08-22: 23:00:00 40 meq via ORAL

## 2020-08-22 MED ORDER — DIPHENHYDRAMINE HCL 50 MG/ML IJ SOLN
50 MG/ML | INTRAMUSCULAR | Status: AC
Start: 2020-08-22 — End: 2020-08-22
  Administered 2020-08-22: 22:00:00

## 2020-08-22 MED FILL — DIPHENHYDRAMINE HCL 50 MG/ML IJ SOLN: 50 mg/mL | INTRAMUSCULAR | Qty: 1

## 2020-08-22 MED FILL — POTASSIUM CHLORIDE CRYS ER 20 MEQ PO TBCR: 20 meq | ORAL | Qty: 2

## 2020-08-22 NOTE — ED Provider Notes (Signed)
Marland Kitchen         [place] ENCOUNTER      Pt Name: Kristin Munoz  MRN: [id number]  [place]: [birth year]  Date of evaluation: 08/22/2020  Provider: Zachery Conch, MD    CHIEF COMPLAINT       Chief Complaint   Patient presents with   ??? Allergic Reaction         HISTORY OF PRESENT ILLNESS  (Location/Symptom, Timing/Onset, Context/Setting, Quality, Duration, Modifying Factors, Severity.)   Kristin Munoz is a 39 y.o. female who presents to the emergency department with complaints that she started on cefdinir yesterday for urinary tract infection she took 1 dose 28 hours ago and about an hour later started having all kinds of abnormal symptoms.  She states that she started seeing like movement in her peripheral vision she became ataxic losing her balance she started itching all over and then she had blisters open up all over her face neck arms in abdomen.  She denies any fever head congestion or swelling of the lips or tongue.  She also mentioned that she has been throwing up blood for a week.      Nursing notes were reviewed.    REVIEW OFSYSTEMS    (2-9 systems for level 4, 10 or more for level 5)   ROS:  General:  No fevers, no chills, no weakness  Cardiovascular:  No chest pain, no palpitations  Respiratory:  No shortness of breath, no cough, no wheezing  Gastrointestinal:  No pain, no nausea, no vomiting, no diarrhea  Musculoskeletal:  No muscle pain, no joint pain  Skin: + rash, no easy bruising  Neurologic:  No speech problems, no headache, no extremity weakness  Psychiatric: + anxiety  Genitourinary:  No dysuria, no hematuria    Except as noted above the remainder of the review of systems was reviewed and negative.       PAST MEDICAL HISTORY     Past Medical History:   Diagnosis Date   ??? Anemia    ??? Arthritis    ??? Asthma    ??? Atrial fibrillation ([place])    ??? CAD (coronary artery disease)    ??? Cancer (HCC)     ovarian, bone, breast and colon   ??? Cardiac arrest [facility])      patient states that her heart stopped and she had to be revived by her daughter   ??? Cerebral artery occlusion with cerebral infarction [facility])    ??? CHF (congestive heart failure) ([place])    ??? Chronic kidney disease    ??? COPD (chronic obstructive pulmonary disease) ([place])    ??? Depression    ??? Diabetes mellitus ([place])     diet control   ??? Headache    ??? Hypertension    ??? Hypokalemia    ??? Kidney stone    ??? Liver failure ([place])    ??? Myocardial infarction ([place]) 06/2016   ??? Pulmonary embolism ([place])    ??? Self-catheterizes urinary bladder    ??? Thyroid disease          SURGICAL HISTORY       Past Surgical History:   Procedure Laterality Date   ??? APPENDECTOMY     ??? BACK SURGERY     ??? BREAST LUMPECTOMY Left    ??? BREAST SURGERY     ??? CHOLECYSTECTOMY     ??? HEMORRHOID SURGERY     ??? HIP SURGERY     ???  HYSTERECTOMY     ??? KIDNEY STONE SURGERY      x 72 per patient   ??? KNEE SURGERY Left    ??? LAPAROSCOPY      x36 per patient   ??? LUNG SURGERY      for pulmonary embolisms         CURRENT MEDICATIONS       Previous Medications    BLOOD GLUCOSE MONITORING SUPPL (ONE TOUCH ULTRA 2) W/DEVICE KIT    1 kit by Does not apply route daily    BLOOD GLUCOSE TEST STRIPS (ASCENSIA AUTODISC VI;ONE TOUCH ULTRA TEST VI) STRIP    1 each by In Vitro route daily QD and PRN Dx:DM2    CLONIDINE (CATAPRES) 0.1 MG TABLET    TAKE ONE TABLET BY MOUTH TWICE DAILY    DILTIAZEM (CARDIZEM CD) 360 MG EXTENDED RELEASE CAPSULE    TAKE ONE CAPSULE BY MOUTH EVERY DAY    EPINEPHRINE (EPIPEN) 0.3 MG/0.3ML SOAJ INJECTION    Inject 0.3 mLs into the muscle as needed (allergic reaction) Use as directed for allergic reaction    ESCITALOPRAM (LEXAPRO) 10 MG TABLET    TAKE 1 TABLET BY MOUTH DAILY    FERROUS GLUCONATE (FERGON) 324 (38 FE) MG TABLET    Take 1 tablet by mouth 2 times daily (with meals)    FLUTICASONE (FLONASE) 50 MCG/ACT NASAL SPRAY    2 sprays by Each Nostril route daily as needed Shake before using     LEVOTHYROXINE (SYNTHROID) 125 MCG TABLET    Take 1 tablet by mouth  Daily    MAGNESIUM OXIDE (MAGNESIUM-OXIDE) 400 (241.3 MG) MG TABS TABLET    Take 1 tablet by mouth 2 times daily    METOPROLOL SUCCINATE (TOPROL XL) 50 MG EXTENDED RELEASE TABLET    Take 1 tablet by mouth daily    NITROGLYCERIN (NITROSTAT) 0.4 MG SL TABLET    TAKE 1 TABLET UNDER THE TONGUE AS NEEDED FOR CHEST PAIN EVERY 5 MINUTES. MAX OF 3 DOSES. IF NO RELIEF AFTER 1 DOSE CALL 911    OMEPRAZOLE (PRILOSEC) 40 MG DELAYED RELEASE CAPSULE    TAKE 1 CAPSULE BY MOUTH DAILY    ONDANSETRON (ZOFRAN) 4 MG TABLET    TAKE 1 TABLET BY MOUTH THREE TIMES DAILY AS NEEDED FOR NAUSEA OR VOMITING    PANTOPRAZOLE (PROTONIX) 40 MG TABLET    Take 1 tablet by mouth daily    POTASSIUM CHLORIDE (KLOR-CON M) 20 MEQ EXTENDED RELEASE TABLET    TAKE 2 TABLETS BY MOUTH THREE TIMES DAILY    POTASSIUM CITRATE (UROCIT-K) 10 MEQ (1080 MG) EXTENDED RELEASE TABLET    Take 10 mEq by mouth 3 times daily (with meals)    PROAIR HFA 108 (90 BASE) MCG/ACT INHALER    INHALE 2 PUFFS BY MOUTH EVERY 4 HOURS AS NEEDED FOR WHEEZING    PROMETHAZINE (PHENERGAN) 25 MG TABLET    Take 1 tablet by mouth every 8 hours as needed for Nausea    SUCRALFATE (CARAFATE) 1 GM TABLET    Take 1 tablet by mouth 4 times daily for 14 days    SUMATRIPTAN (IMITREX) 100 MG TABLET    TAKE 1 TABLET BY MOUTH 1 TIME AS NEEDED FOR MIGRAINE    TAMSULOSIN (FLOMAX) 0.4 MG CAPSULE    Take 1 capsule by mouth daily    TRAZODONE (DESYREL) 100 MG TABLET    Take 1 tablet by mouth nightly       ALLERGIES  Macadamia nut oil, Naproxen, Pcn [penicillins], Peanut-containing drug products, Sweet potato, Tizanidine, Toradol [ketorolac tromethamine], Zanaflex [tizanidine hcl], Desmopressin acetate, Macrobid [nitrofurantoin], Pertussis vaccines, Polio virus vaccine live oral trivalent, Tetanus toxoids, Cefdinir, Levaquin [levofloxacin in d5w], and Levofloxacin    FAMILY HISTORY     History reviewed. No pertinent family history.       SOCIAL HISTORY       Social History     Socioeconomic History   ???  Marital status: Legally Separated     Spouse name: None   ??? Number of children: None   ??? Years of education: None   ??? Highest education level: None   Occupational History   ??? None   Tobacco Use   ??? Smoking status: Never Smoker   ??? Smokeless tobacco: Never Used   Vaping Use   ??? Vaping Use: Never used   Substance and Sexual Activity   ??? Alcohol use: No   ??? Drug use: Yes     Types: Marijuana Sherrie Mustache), Methamphetamines (Crystal Meth)   ??? Sexual activity: None   Other Topics Concern   ??? None   Social History Narrative   ??? None     Social Determinants of Health     Financial Resource Strain:    ??? Difficulty of Paying Living Expenses: Not on file   Food Insecurity:    ??? Worried About Charity fundraiser in the Last Year: Not on file   ??? Ran Out of Food in the Last Year: Not on file   Transportation Needs:    ??? Lack of Transportation (Medical): Not on file   ??? Lack of Transportation (Non-Medical): Not on file   Physical Activity:    ??? Days of Exercise per Week: Not on file   ??? Minutes of Exercise per Session: Not on file   Stress:    ??? Feeling of Stress : Not on file   Social Connections:    ??? Frequency of Communication with Friends and Family: Not on file   ??? Frequency of Social Gatherings with Friends and Family: Not on file   ??? Attends Religious Services: Not on file   ??? Active Member of Clubs or Organizations: Not on file   ??? Attends Archivist Meetings: Not on file   ??? Marital Status: Not on file   Intimate Partner Violence:    ??? Fear of Current or Ex-Partner: Not on file   ??? Emotionally Abused: Not on file   ??? Physically Abused: Not on file   ??? Sexually Abused: Not on file   Housing Stability:    ??? Unable to Pay for Housing in the Last Year: Not on file   ??? Number of Places Lived in the Last Year: Not on file   ??? Unstable Housing in the Last Year: Not on file         PHYSICAL EXAM    (up to 7 for level 4, 8 or more for level 5)     ED Triage Vitals [08/22/20 1624]   BP Temp Temp Source Pulse Resp SpO2 Height  Weight   (!) 161/135 98.2 ??F (36.8 ??C) Oral 101 18 99 % '5\' 2"'  (1.575 m) 200 lb (90.7 kg)       Physical Exam  General :Patient is awake, alert, oriented, she is having constant movements of her extremities with slight tremoring.  HEENT: Pupils are equally round and reactive to light, EOMI, conjunctivae clear.   Neck: Neck is supple,  full range of motion, trachea midline  Cardiac: Heart tachycardic rhythm, no murmurs, rubs, or gallops  Lungs: Lungs are clear to auscultation, there is no wheezing, rhonchi, or rales. There is no use of accessory muscles.  Chest wall: There is no tenderness to palpation over the chest wall or over ribs  Abdomen: Abdomen is soft, nontender, nondistended. There is no firm or pulsatile masses, no rebound rigidity or guarding.   Musculoskeletal: 5 out of 5 strength in all 4 extremities.  No focal muscle deficits are appreciated  Neuro: Motor intact, sensory intact, level of consciousness is normal, and have been constant random motions of her arms and legs dermatology: Skin is warm and dry she has scabbed open skin lesions all over her face neck lower arms and abdomen.  Some of these have some erythema surrounding them  Psych: Mentation is grossly normal, cognition is grossly normal. Affect is anxious.      DIAGNOSTIC RESULTS     EKG: All EKG's are interpreted by the Emergency Department Physician who either signs or Co-signs this chart in the [facility] a cardiologist.    The EKG interpreted by me shows rhythm rate of 91 no acute ST changes    RADIOLOGY:   Non-plain film images such as CT, Ultrasound and MRI are read by the radiologist. Plain radiographic images are visualized and preliminarily interpreted by the emergency physician with the below findings:      ? Radiologist's Report Reviewed:  CT Head WO Contrast   Final Result      No acute intracranial pathology                       ED BEDSIDE ULTRASOUND:   Performed by ED Physician - none    LABS:    I have reviewed and interpreted  all of the currently available lab results from this visit (ifapplicable):  Results for orders placed or performed during the hospital encounter of 08/22/20   CBC Auto Differential   Result Value Ref Range    WBC 3.9 (L) 4.0 - 11.0 K/uL    RBC 4.24 3.80 - 5.80 M/uL    Hemoglobin 9.2 (L) 11.5 - 16.5 g/dL    Hematocrit 31.8 (L) 37.0 - 47.0 %    MCV 75.0 (L) 80.0 - 100.0 fL    MCH 21.7 (L) 27.0 - 32.0 pg    MCHC 28.9 (L) 31.0 - 35.0 g/dL    RDW 15.1 11.0 - 16.0 %    Platelets 225 150 - 400 K/uL    MPV 10.6 (H) 6.0 - 10.0 fL    Neutrophils % 54.4 %    Immature Granulocytes % 0.3 0.0 - 5.0 %    Lymphocytes % 33.5 %    Monocytes % 7.7 %    Eosinophils % 3.6 %    Basophils % 0.5 %    Neutrophils Absolute 2.1 2.0 - 7.5 K/uL    Immature Granulocytes # 0.0 K/uL    Lymphocytes Absolute 1.3 (L) 1.5 - 4.0 K/uL    Monocytes Absolute 0.3 0.2 - 0.8 K/uL    Eosinophils Absolute 0.1 0.0 - 0.4 K/uL    Basophils Absolute 0.0 0.0 - 0.1 K/uL   Comprehensive Metabolic Panel w/ Reflex to MG   Result Value Ref Range    Sodium 141 136 - 145 mmol/L    Potassium reflex Magnesium 2.7 (LL) 3.4 - 5.1 mmol/L    Chloride 105 98 - 107 mmol/L  CO2 25 20 - 30 mmol/L    Anion Gap 11 3 - 16    Glucose 82 74 - 106 mg/dL    BUN 5 (L) 6 - 20 mg/dL    CREATININE 0.6 0.4 - 1.2 mg/dL    GFR Non-African American >60 >59    GFR African American >59 >59    Calcium 8.7 8.5 - 10.5 mg/dL    Total Protein 6.0 (L) 6.4 - 8.3 g/dL    Albumin 3.3 (L) 3.4 - 4.8 g/dL    Albumin/Globulin Ratio 1.2 0.8 - 2.0    Total Bilirubin 0.4 0.3 - 1.2 mg/dL    Alkaline Phosphatase 146 (H) 25 - 100 U/L    ALT 26 4 - 36 U/L    AST 39 (H) 8 - 33 U/L    Globulin 2.7 Not Established g/dL   Pregnancy, Urine   Result Value Ref Range    HCG(Urine) Pregnancy Test Negative Detects HCG level >20 MIU/mL   Lactic Acid, Plasma   Result Value Ref Range    Lactic Acid 1.8 0.4 - 2.0 mmol/L   Urinalysis Reflex to Culture    Specimen: Urine, clean catch   Result Value Ref Range    Color, UA Yellow  Straw/Yellow    Clarity, UA Clear Clear    Glucose, Ur Negative Negative mg/dL    Bilirubin Urine Negative Negative    Ketones, Urine Negative Negative mg/dL    Specific Gravity, UA 1.020 1.005 - 1.030    Blood, Urine Negative Negative    pH, UA 7.0 5.0 - 8.0    Protein, UA Negative Negative mg/dL    Urobilinogen, Urine 0.2 <2.0 E.U./dL    Nitrite, Urine Negative Negative    Leukocyte Esterase, Urine Negative Negative    Microscopic Examination Not Indicated     Urine Type NotGiven     Urine Reflex to Culture Not Indicated    Drug Screen, Multiple, Urine   Result Value Ref Range    PCP Screen, Urine Neg Negative <25 ng/mL    Benzodiazepine Screen, Urine Neg Negative <300 ng/mL    Cocaine Metabolite Screen, Urine Neg Negative <300 ng/mL    Amphetamine Screen, Urine POSITIVE (A) Negative <1000 ng/mL    Cannabinoid Scrn, Ur POSITIVE (A) Negative <50 ng/mL    Opiate Scrn, Ur Neg Negative <300 ng/mL    Barbiturate Screen, Ur Neg Negative <616 ng/mL    Tricyclic Neg Negative <073 ng/mL    Methadone Screen, Urine Neg Negative <300 ng/ml    Propoxyphene Screen, Urine Neg Negative <300 ng/mL    Methamphetamine, Urine POSITIVE (A) Negative <1000 ng/mL    UR Oxycodone Rapid Screen Neg Negative <100 ng/mL    Buprenorphine Qual, Urine POSITIVE (A) Negative <25 ng/mL    Drug Screen Comment: see below    Troponin   Result Value Ref Range    Troponin <0.30 <0.30 ng/mL   Magnesium   Result Value Ref Range    Magnesium 1.7 1.7 - 2.4 mg/dL        All other labs were within normal range or not returned as of this dictation.    EMERGENCY DEPARTMENT COURSE and DIFFERENTIAL DIAGNOSIS/MDM:   Vitals:    Vitals:    08/22/20 1624 08/22/20 1700   BP: (!) 161/135 (!) 175/107   Pulse: 101 95   Resp: 18 (!) 31   Temp: 98.2 ??F (36.8 ??C)    TempSrc: Oral    SpO2: 99% 99%   Weight: 200  lb (90.7 kg)    Height: '5\' 2"'  (1.575 m)        MEDICATIONS ADMINISTERED IN ED:  Medications   diphenhydrAMINE (BENADRYL) 50 MG/ML injection (  Given by Other 08/22/20  1630)   lactated ringers bolus (0 mLs IntraVENous Stopped 08/22/20 1748)   potassium chloride (KLOR-CON M) extended release tablet 40 mEq (40 mEq Oral Given 08/22/20 1807)       Patient has a low potassium which would explain her muscle cramps but she is positive for methamphetamine as expected explaining the skin lesions she is also positive for Suboxone and marijuana.  She vehemently states that she does not take methamphetamine or Suboxone the only thing she uses is marijuana and that she is prescribed this in [place] although she knows it is not legal here.  I have told her that as long she does not do any more meth the skin lesions should heal up and she is instructed to eat bananas oranges orange juice and drink Gatorade Powerade that hypertension to bring her potassium level up.  She is to follow-up with her family physician in 3 to 4 days for reevaluation    The patient will follow-up with their PCP in 1-2 days for reevaluation.  If the patient or family members have anyfurther concerns or any worsening symptoms they will return to the ED for reevaluation.         CONSULTS:  None    PROCEDURES:  Procedures    CRITICAL CARE TIME    Total Critical Care time was 0 minutes, excluding separately reportable procedures.   There was a high probability of clinically significant/life threatening deterioration in the patient's condition which required my urgent intervention.      FINAL IMPRESSION      1. 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 If there are any questions or concerns please feel free to contact the dictating providerfor clarification.    Zachery Conch, MD  Attending Emergency Physician             Zachery Conch, MD  08/22/20 [phone]

## 2020-08-22 NOTE — ED Notes (Signed)
Dc instructions given to patient at this time, patient out to the lobby to wait for a ride.     Nicanor Alcon, RN  08/22/20 1859

## 2020-08-22 NOTE — ED Notes (Signed)
Patient states that she can't find either of her two cell phones, wants to know if I will call ems to check with them to see if they were left in the ambulance. Informed patient that I will call in a few minutes. The linen bag checked in patients room at this time for the phones, none in bag found.     Nicanor Alcon, RN  08/22/20 1815

## 2020-08-22 NOTE — ED Notes (Signed)
Patient tearful at this time, thinks that she is going to be put out in the cold, patient informed that she can wait in the lobby for her ride to home, but is unable to wait in exam room. Patient verbalized understanding.     Nicanor Alcon, RN  08/22/20 1905

## 2020-08-22 NOTE — ED Notes (Signed)
Called lab to draw labs on this patient, spoke with [facility]. I was not able to obtain labs prior to this.     Jiles Prows  08/22/20 1646

## 2020-08-22 NOTE — ED Notes (Signed)
Ems called in regards to patients cell phones, no phones found per ems, patient made aware.     Nicanor Alcon, RN  08/22/20 1816

## 2020-08-22 NOTE — ED Notes (Signed)
Patient attempted to call someone for a ride home, states number is not working, will attempt to call again from lobby. Offered to call patients mother, patient states that she can't call her that there is a court order between her and her mother.     Nicanor Alcon, RN  08/22/20 1900

## 2020-10-28 DEATH — deceased
